# Patient Record
Sex: Male | Born: 1960 | Race: White | Hispanic: No | Smoking: Former smoker
Health system: Southern US, Community
[De-identification: ages and names within clinical notes are randomized; demographics above are authoritative.]

## PROBLEM LIST (undated history)

## (undated) DIAGNOSIS — T451X5A Adverse effect of antineoplastic and immunosuppressive drugs, initial encounter: Secondary | ICD-10-CM

## (undated) DIAGNOSIS — C099 Malignant neoplasm of tonsil, unspecified: Secondary | ICD-10-CM

## (undated) DIAGNOSIS — F32A Depression, unspecified: Secondary | ICD-10-CM

## (undated) DIAGNOSIS — K219 Gastro-esophageal reflux disease without esophagitis: Secondary | ICD-10-CM

## (undated) DIAGNOSIS — F4310 Post-traumatic stress disorder, unspecified: Secondary | ICD-10-CM

## (undated) DIAGNOSIS — I219 Acute myocardial infarction, unspecified: Secondary | ICD-10-CM

## (undated) DIAGNOSIS — I10 Essential (primary) hypertension: Secondary | ICD-10-CM

## (undated) DIAGNOSIS — Z9289 Personal history of other medical treatment: Secondary | ICD-10-CM

## (undated) DIAGNOSIS — G629 Polyneuropathy, unspecified: Secondary | ICD-10-CM

## (undated) DIAGNOSIS — F1910 Other psychoactive substance abuse, uncomplicated: Secondary | ICD-10-CM

## (undated) DIAGNOSIS — T6701XA Heatstroke and sunstroke, initial encounter: Secondary | ICD-10-CM

## (undated) DIAGNOSIS — I2510 Atherosclerotic heart disease of native coronary artery without angina pectoris: Secondary | ICD-10-CM

## (undated) DIAGNOSIS — M47812 Spondylosis without myelopathy or radiculopathy, cervical region: Secondary | ICD-10-CM

## (undated) DIAGNOSIS — E039 Hypothyroidism, unspecified: Secondary | ICD-10-CM

## (undated) DIAGNOSIS — D649 Anemia, unspecified: Secondary | ICD-10-CM

## (undated) DIAGNOSIS — C439 Malignant melanoma of skin, unspecified: Secondary | ICD-10-CM

## (undated) DIAGNOSIS — F329 Major depressive disorder, single episode, unspecified: Secondary | ICD-10-CM

## (undated) DIAGNOSIS — C4491 Basal cell carcinoma of skin, unspecified: Secondary | ICD-10-CM

## (undated) DIAGNOSIS — I639 Cerebral infarction, unspecified: Secondary | ICD-10-CM

## (undated) DIAGNOSIS — IMO0002 Reserved for concepts with insufficient information to code with codable children: Secondary | ICD-10-CM

## (undated) DIAGNOSIS — E785 Hyperlipidemia, unspecified: Secondary | ICD-10-CM

## (undated) DIAGNOSIS — F419 Anxiety disorder, unspecified: Secondary | ICD-10-CM

## (undated) DIAGNOSIS — T8859XA Other complications of anesthesia, initial encounter: Secondary | ICD-10-CM

## (undated) DIAGNOSIS — T4145XA Adverse effect of unspecified anesthetic, initial encounter: Secondary | ICD-10-CM

## (undated) DIAGNOSIS — I4891 Unspecified atrial fibrillation: Secondary | ICD-10-CM

## (undated) DIAGNOSIS — G620 Drug-induced polyneuropathy: Secondary | ICD-10-CM

## (undated) HISTORY — PX: NECK DISSECTION: SUR422

## (undated) HISTORY — DX: Personal history of other medical treatment: Z92.89

## (undated) HISTORY — DX: Hypothyroidism, unspecified: E03.9

## (undated) HISTORY — PX: CAROTID ENDARTERECTOMY: SUR193

## (undated) HISTORY — DX: Malignant neoplasm of tonsil, unspecified: C09.9

## (undated) HISTORY — DX: Anxiety disorder, unspecified: F41.9

## (undated) HISTORY — PX: PARTIAL GLOSSECTOMY: SHX2173

## (undated) HISTORY — DX: Other psychoactive substance abuse, uncomplicated: F19.10

## (undated) HISTORY — DX: Polyneuropathy, unspecified: G62.9

## (undated) HISTORY — DX: Acute myocardial infarction, unspecified: I21.9

## (undated) HISTORY — DX: Spondylosis without myelopathy or radiculopathy, cervical region: M47.812

## (undated) HISTORY — PX: MELANOMA EXCISION: SHX5266

## (undated) HISTORY — PX: SKIN CANCER EXCISION: SHX779

## (undated) HISTORY — DX: Post-traumatic stress disorder, unspecified: F43.10

## (undated) HISTORY — DX: Essential (primary) hypertension: I10

## (undated) HISTORY — DX: Basal cell carcinoma of skin, unspecified: C44.91

## (undated) HISTORY — DX: Malignant melanoma of skin, unspecified: C43.9

---

## 2003-05-18 ENCOUNTER — Emergency Department (HOSPITAL_COMMUNITY): Admission: EM | Admit: 2003-05-18 | Discharge: 2003-05-18 | Payer: Self-pay | Admitting: Emergency Medicine

## 2003-12-01 ENCOUNTER — Ambulatory Visit (HOSPITAL_COMMUNITY): Admission: RE | Admit: 2003-12-01 | Discharge: 2003-12-01 | Payer: Self-pay | Admitting: *Deleted

## 2003-12-14 ENCOUNTER — Emergency Department (HOSPITAL_COMMUNITY): Admission: EM | Admit: 2003-12-14 | Discharge: 2003-12-14 | Payer: Self-pay | Admitting: Emergency Medicine

## 2005-05-02 ENCOUNTER — Emergency Department (HOSPITAL_COMMUNITY): Admission: EM | Admit: 2005-05-02 | Discharge: 2005-05-02 | Payer: Self-pay | Admitting: Emergency Medicine

## 2005-10-26 ENCOUNTER — Emergency Department (HOSPITAL_COMMUNITY): Admission: EM | Admit: 2005-10-26 | Discharge: 2005-10-26 | Payer: Self-pay | Admitting: Emergency Medicine

## 2007-03-13 DIAGNOSIS — Z9289 Personal history of other medical treatment: Secondary | ICD-10-CM

## 2007-03-13 HISTORY — DX: Personal history of other medical treatment: Z92.89

## 2007-03-13 HISTORY — PX: PHARYNGECTOMY: SUR1024

## 2007-03-13 HISTORY — PX: TONSILLECTOMY: SUR1361

## 2007-03-13 HISTORY — PX: MULTIPLE TOOTH EXTRACTIONS: SHX2053

## 2007-09-01 ENCOUNTER — Emergency Department (HOSPITAL_COMMUNITY): Admission: EM | Admit: 2007-09-01 | Discharge: 2007-09-01 | Payer: Self-pay | Admitting: Emergency Medicine

## 2007-09-02 ENCOUNTER — Emergency Department (HOSPITAL_COMMUNITY): Admission: EM | Admit: 2007-09-02 | Discharge: 2007-09-03 | Payer: Self-pay | Admitting: Emergency Medicine

## 2007-09-16 ENCOUNTER — Ambulatory Visit: Admission: RE | Admit: 2007-09-16 | Discharge: 2007-12-17 | Payer: Self-pay | Admitting: Radiation Oncology

## 2007-09-19 ENCOUNTER — Encounter: Admission: RE | Admit: 2007-09-19 | Discharge: 2007-09-19 | Payer: Self-pay | Admitting: Dentistry

## 2007-09-19 ENCOUNTER — Ambulatory Visit: Payer: Self-pay | Admitting: Dentistry

## 2007-09-22 ENCOUNTER — Ambulatory Visit: Payer: Self-pay | Admitting: Hematology & Oncology

## 2007-09-25 ENCOUNTER — Ambulatory Visit: Payer: Self-pay | Admitting: Dentistry

## 2007-10-01 LAB — CBC WITH DIFFERENTIAL/PLATELET
Basophils Absolute: 0.1 10*3/uL (ref 0.0–0.1)
EOS%: 0.8 % (ref 0.0–7.0)
HCT: 37 % — ABNORMAL LOW (ref 38.7–49.9)
HGB: 12.5 g/dL — ABNORMAL LOW (ref 13.0–17.1)
MCH: 30.9 pg (ref 28.0–33.4)
MCV: 91.1 fL (ref 81.6–98.0)
MONO%: 8.1 % (ref 0.0–13.0)
NEUT%: 63.9 % (ref 40.0–75.0)

## 2007-10-01 LAB — COMPREHENSIVE METABOLIC PANEL
ALT: 8 U/L (ref 0–53)
AST: 12 U/L (ref 0–37)
Albumin: 4.2 g/dL (ref 3.5–5.2)
BUN: 12 mg/dL (ref 6–23)
Chloride: 106 mEq/L (ref 96–112)
Creatinine, Ser: 0.89 mg/dL (ref 0.40–1.50)
Glucose, Bld: 95 mg/dL (ref 70–99)
Total Protein: 6.4 g/dL (ref 6.0–8.3)

## 2007-10-01 LAB — PROTHROMBIN TIME: INR: 1 (ref 0.0–1.5)

## 2007-10-14 ENCOUNTER — Ambulatory Visit (HOSPITAL_COMMUNITY): Admission: RE | Admit: 2007-10-14 | Discharge: 2007-10-14 | Payer: Self-pay | Admitting: Radiation Oncology

## 2007-10-20 LAB — CBC WITH DIFFERENTIAL/PLATELET
BASO%: 0.8 % (ref 0.0–2.0)
Basophils Absolute: 0.1 10*3/uL (ref 0.0–0.1)
HCT: 40 % (ref 38.7–49.9)
HGB: 13.3 g/dL (ref 13.0–17.1)
LYMPH%: 16.9 % (ref 14.0–48.0)
MCHC: 33.2 g/dL (ref 32.0–35.9)
MONO#: 0.8 10*3/uL (ref 0.1–0.9)
NEUT%: 73.6 % (ref 40.0–75.0)
Platelets: 210 10*3/uL (ref 145–400)
WBC: 10.5 10*3/uL — ABNORMAL HIGH (ref 4.0–10.0)

## 2007-10-20 LAB — COMPREHENSIVE METABOLIC PANEL
ALT: 9 U/L (ref 0–53)
BUN: 7 mg/dL (ref 6–23)
CO2: 28 mEq/L (ref 19–32)
Calcium: 9.3 mg/dL (ref 8.4–10.5)
Creatinine, Ser: 0.85 mg/dL (ref 0.40–1.50)
Glucose, Bld: 89 mg/dL (ref 70–99)
Total Bilirubin: 0.7 mg/dL (ref 0.3–1.2)

## 2007-10-23 LAB — BASIC METABOLIC PANEL
CO2: 25 mEq/L (ref 19–32)
Chloride: 103 mEq/L (ref 96–112)
Glucose, Bld: 113 mg/dL — ABNORMAL HIGH (ref 70–99)
Potassium: 3.1 mEq/L — ABNORMAL LOW (ref 3.5–5.3)
Sodium: 137 mEq/L (ref 135–145)

## 2007-10-27 LAB — COMPREHENSIVE METABOLIC PANEL
ALT: 91 U/L — ABNORMAL HIGH (ref 0–53)
AST: 44 U/L — ABNORMAL HIGH (ref 0–37)
BUN: 12 mg/dL (ref 6–23)
CO2: 30 mEq/L (ref 19–32)
Calcium: 9.9 mg/dL (ref 8.4–10.5)
Chloride: 100 mEq/L (ref 96–112)
Creatinine, Ser: 0.85 mg/dL (ref 0.40–1.50)
Total Bilirubin: 0.9 mg/dL (ref 0.3–1.2)

## 2007-10-27 LAB — CBC WITH DIFFERENTIAL/PLATELET
BASO%: 0.3 % (ref 0.0–2.0)
Basophils Absolute: 0 10*3/uL (ref 0.0–0.1)
EOS%: 1.7 % (ref 0.0–7.0)
HCT: 44.3 % (ref 38.7–49.9)
HGB: 14.9 g/dL (ref 13.0–17.1)
LYMPH%: 20.7 % (ref 14.0–48.0)
MCH: 29.5 pg (ref 28.0–33.4)
MCHC: 33.5 g/dL (ref 32.0–35.9)
MCV: 88.1 fL (ref 81.6–98.0)
NEUT%: 69.4 % (ref 40.0–75.0)
Platelets: 217 10*3/uL (ref 145–400)

## 2007-10-29 ENCOUNTER — Ambulatory Visit: Payer: Self-pay | Admitting: Dentistry

## 2007-11-03 LAB — CBC WITH DIFFERENTIAL/PLATELET
BASO%: 0.5 % (ref 0.0–2.0)
Basophils Absolute: 0 10*3/uL (ref 0.0–0.1)
EOS%: 1.4 % (ref 0.0–7.0)
HGB: 13.8 g/dL (ref 13.0–17.1)
MCH: 29.8 pg (ref 28.0–33.4)
MCHC: 33.9 g/dL (ref 32.0–35.9)
MCV: 88 fL (ref 81.6–98.0)
MONO%: 6.3 % (ref 0.0–13.0)
RBC: 4.63 10*6/uL (ref 4.20–5.71)
RDW: 15.1 % — ABNORMAL HIGH (ref 11.2–14.6)

## 2007-11-03 LAB — COMPREHENSIVE METABOLIC PANEL
AST: 15 U/L (ref 0–37)
Albumin: 4.2 g/dL (ref 3.5–5.2)
Alkaline Phosphatase: 67 U/L (ref 39–117)
BUN: 10 mg/dL (ref 6–23)
Potassium: 4.6 mEq/L (ref 3.5–5.3)

## 2007-11-06 ENCOUNTER — Ambulatory Visit: Payer: Self-pay | Admitting: Oncology

## 2007-11-10 LAB — COMPREHENSIVE METABOLIC PANEL
AST: 24 U/L (ref 0–37)
Alkaline Phosphatase: 61 U/L (ref 39–117)
BUN: 8 mg/dL (ref 6–23)
Creatinine, Ser: 0.86 mg/dL (ref 0.40–1.50)
Total Bilirubin: 0.7 mg/dL (ref 0.3–1.2)

## 2007-11-10 LAB — CBC WITH DIFFERENTIAL/PLATELET
Basophils Absolute: 0 10*3/uL (ref 0.0–0.1)
EOS%: 3.7 % (ref 0.0–7.0)
HCT: 43.6 % (ref 38.7–49.9)
HGB: 15 g/dL (ref 13.0–17.1)
MCH: 29.3 pg (ref 28.0–33.4)
MCV: 85.3 fL (ref 81.6–98.0)
MONO%: 15.3 % — ABNORMAL HIGH (ref 0.0–13.0)
NEUT%: 61 % (ref 40.0–75.0)
RDW: 14.5 % (ref 11.2–14.6)

## 2007-11-18 ENCOUNTER — Inpatient Hospital Stay (HOSPITAL_COMMUNITY): Admission: AD | Admit: 2007-11-18 | Discharge: 2007-11-24 | Payer: Self-pay | Admitting: Oncology

## 2007-11-18 LAB — CBC WITH DIFFERENTIAL/PLATELET
BASO%: 0.5 % (ref 0.0–2.0)
Eosinophils Absolute: 0 10*3/uL (ref 0.0–0.5)
MCHC: 34.6 g/dL (ref 32.0–35.9)
MONO#: 0.5 10*3/uL (ref 0.1–0.9)
NEUT#: 4.7 10*3/uL (ref 1.5–6.5)
Platelets: 106 10*3/uL — ABNORMAL LOW (ref 145–400)
RBC: 5.32 10*6/uL (ref 4.20–5.71)
RDW: 14.2 % (ref 11.2–14.6)
WBC: 5.8 10*3/uL (ref 4.0–10.0)
lymph#: 0.5 10*3/uL — ABNORMAL LOW (ref 0.9–3.3)

## 2007-11-18 LAB — COMPREHENSIVE METABOLIC PANEL
ALT: 66 U/L — ABNORMAL HIGH (ref 0–53)
Albumin: 4 g/dL (ref 3.5–5.2)
CO2: 31 mEq/L (ref 19–32)
Chloride: 95 mEq/L — ABNORMAL LOW (ref 96–112)
Glucose, Bld: 111 mg/dL — ABNORMAL HIGH (ref 70–99)
Potassium: 4 mEq/L (ref 3.5–5.3)
Sodium: 137 mEq/L (ref 135–145)
Total Protein: 7.1 g/dL (ref 6.0–8.3)

## 2007-11-19 ENCOUNTER — Ambulatory Visit: Payer: Self-pay | Admitting: Hematology

## 2007-12-01 LAB — COMPREHENSIVE METABOLIC PANEL
ALT: 19 U/L (ref 0–53)
Albumin: 3.5 g/dL (ref 3.5–5.2)
Alkaline Phosphatase: 60 U/L (ref 39–117)
Glucose, Bld: 122 mg/dL — ABNORMAL HIGH (ref 70–99)
Potassium: 4.3 mEq/L (ref 3.5–5.3)
Sodium: 139 mEq/L (ref 135–145)
Total Bilirubin: 0.4 mg/dL (ref 0.3–1.2)
Total Protein: 6.2 g/dL (ref 6.0–8.3)

## 2007-12-01 LAB — CBC WITH DIFFERENTIAL/PLATELET
BASO%: 0.7 % (ref 0.0–2.0)
Eosinophils Absolute: 0 10*3/uL (ref 0.0–0.5)
LYMPH%: 20.9 % (ref 14.0–48.0)
MCHC: 34.2 g/dL (ref 32.0–35.9)
MONO#: 0.4 10*3/uL (ref 0.1–0.9)
MONO%: 29 % — ABNORMAL HIGH (ref 0.0–13.0)
NEUT#: 0.6 10*3/uL — ABNORMAL LOW (ref 1.5–6.5)
Platelets: 147 10*3/uL (ref 145–400)
RBC: 4.24 10*6/uL (ref 4.20–5.71)
RDW: 18.2 % — ABNORMAL HIGH (ref 11.2–14.6)
WBC: 1.3 10*3/uL — ABNORMAL LOW (ref 4.0–10.0)

## 2007-12-09 LAB — CBC WITH DIFFERENTIAL/PLATELET
BASO%: 0 % (ref 0.0–2.0)
EOS%: 0.1 % (ref 0.0–7.0)
LYMPH%: 7.5 % — ABNORMAL LOW (ref 14.0–48.0)
MCH: 29.4 pg (ref 28.0–33.4)
MCHC: 33.7 g/dL (ref 32.0–35.9)
MCV: 87.3 fL (ref 81.6–98.0)
MONO%: 11.8 % (ref 0.0–13.0)
NEUT#: 5.3 10*3/uL (ref 1.5–6.5)
Platelets: 268 10*3/uL (ref 145–400)
RBC: 4.74 10*6/uL (ref 4.20–5.71)
RDW: 19.6 % — ABNORMAL HIGH (ref 11.2–14.6)

## 2007-12-18 LAB — CBC WITH DIFFERENTIAL/PLATELET
BASO%: 0.4 % (ref 0.0–2.0)
Basophils Absolute: 0 10e3/uL (ref 0.0–0.1)
EOS%: 0.3 % (ref 0.0–7.0)
Eosinophils Absolute: 0 10e3/uL (ref 0.0–0.5)
HCT: 41.3 % (ref 38.7–49.9)
HGB: 14 g/dL (ref 13.0–17.1)
LYMPH%: 5.4 % — ABNORMAL LOW (ref 14.0–48.0)
MCH: 29.3 pg (ref 28.0–33.4)
MCHC: 34 g/dL (ref 32.0–35.9)
MCV: 86.1 fL (ref 81.6–98.0)
MONO#: 1 10e3/uL — ABNORMAL HIGH (ref 0.1–0.9)
MONO%: 13.9 % — ABNORMAL HIGH (ref 0.0–13.0)
NEUT#: 5.7 10e3/uL (ref 1.5–6.5)
NEUT%: 80 % — ABNORMAL HIGH (ref 40.0–75.0)
Platelets: 156 10e3/uL (ref 145–400)
RBC: 4.8 10e6/uL (ref 4.20–5.71)
RDW: 20.3 % — ABNORMAL HIGH (ref 11.2–14.6)
WBC: 7.1 10e3/uL (ref 4.0–10.0)
lymph#: 0.4 10e3/uL — ABNORMAL LOW (ref 0.9–3.3)

## 2007-12-18 LAB — COMPREHENSIVE METABOLIC PANEL
Albumin: 3.9 g/dL (ref 3.5–5.2)
CO2: 30 mEq/L (ref 19–32)
Calcium: 9.9 mg/dL (ref 8.4–10.5)
Chloride: 98 mEq/L (ref 96–112)
Glucose, Bld: 133 mg/dL — ABNORMAL HIGH (ref 70–99)
Sodium: 138 mEq/L (ref 135–145)
Total Bilirubin: 0.3 mg/dL (ref 0.3–1.2)
Total Protein: 6.1 g/dL (ref 6.0–8.3)

## 2008-01-13 ENCOUNTER — Ambulatory Visit: Payer: Self-pay | Admitting: Dentistry

## 2008-01-27 ENCOUNTER — Ambulatory Visit (HOSPITAL_COMMUNITY): Admission: RE | Admit: 2008-01-27 | Discharge: 2008-01-27 | Payer: Self-pay | Admitting: Radiation Oncology

## 2008-02-09 ENCOUNTER — Ambulatory Visit: Payer: Self-pay | Admitting: Oncology

## 2008-02-20 LAB — CBC WITH DIFFERENTIAL/PLATELET
Basophils Absolute: 0 10*3/uL (ref 0.0–0.1)
EOS%: 1.5 % (ref 0.0–7.0)
Eosinophils Absolute: 0.1 10*3/uL (ref 0.0–0.5)
HGB: 14.5 g/dL (ref 13.0–17.1)
LYMPH%: 15.9 % (ref 14.0–48.0)
MCH: 34.5 pg — ABNORMAL HIGH (ref 28.0–33.4)
MCV: 98.9 fL — ABNORMAL HIGH (ref 81.6–98.0)
MONO%: 10.5 % (ref 0.0–13.0)
NEUT#: 2.7 10*3/uL (ref 1.5–6.5)
Platelets: 122 10*3/uL — ABNORMAL LOW (ref 145–400)
RBC: 4.2 10*6/uL (ref 4.20–5.71)
RDW: 14.1 % (ref 11.2–14.6)

## 2008-02-20 LAB — COMPREHENSIVE METABOLIC PANEL
AST: 11 U/L (ref 0–37)
Alkaline Phosphatase: 56 U/L (ref 39–117)
BUN: 9 mg/dL (ref 6–23)
Glucose, Bld: 89 mg/dL (ref 70–99)
Potassium: 4.1 mEq/L (ref 3.5–5.3)
Total Bilirubin: 0.5 mg/dL (ref 0.3–1.2)

## 2008-03-12 HISTORY — PX: CERVICAL DISCECTOMY: SHX98

## 2008-03-16 ENCOUNTER — Ambulatory Visit (HOSPITAL_COMMUNITY): Admission: RE | Admit: 2008-03-16 | Discharge: 2008-03-16 | Payer: Self-pay | Admitting: Oncology

## 2008-04-05 ENCOUNTER — Ambulatory Visit: Payer: Self-pay | Admitting: Oncology

## 2008-04-29 ENCOUNTER — Ambulatory Visit (HOSPITAL_COMMUNITY): Admission: RE | Admit: 2008-04-29 | Discharge: 2008-04-29 | Payer: Self-pay | Admitting: Oncology

## 2008-05-03 ENCOUNTER — Ambulatory Visit: Payer: Self-pay | Admitting: Dentistry

## 2008-06-15 ENCOUNTER — Ambulatory Visit: Admission: RE | Admit: 2008-06-15 | Discharge: 2008-06-21 | Payer: Self-pay | Admitting: Radiation Oncology

## 2008-08-26 ENCOUNTER — Emergency Department (HOSPITAL_COMMUNITY): Admission: EM | Admit: 2008-08-26 | Discharge: 2008-08-26 | Payer: Self-pay | Admitting: Emergency Medicine

## 2008-10-01 ENCOUNTER — Ambulatory Visit: Payer: Self-pay | Admitting: Oncology

## 2008-10-06 ENCOUNTER — Encounter: Payer: Self-pay | Admitting: Internal Medicine

## 2008-10-06 LAB — CBC WITH DIFFERENTIAL/PLATELET
BASO%: 0.3 % (ref 0.0–2.0)
EOS%: 0.7 % (ref 0.0–7.0)
LYMPH%: 15 % (ref 14.0–49.0)
MCH: 32.7 pg (ref 27.2–33.4)
MCHC: 34.2 g/dL (ref 32.0–36.0)
MCV: 95.7 fL (ref 79.3–98.0)
MONO%: 9.8 % (ref 0.0–14.0)
NEUT#: 4.1 10*3/uL (ref 1.5–6.5)
Platelets: 154 10*3/uL (ref 140–400)
RBC: 4.71 10*6/uL (ref 4.20–5.82)
RDW: 13.5 % (ref 11.0–14.6)

## 2008-10-06 LAB — COMPREHENSIVE METABOLIC PANEL
AST: 43 U/L — ABNORMAL HIGH (ref 0–37)
Albumin: 4.6 g/dL (ref 3.5–5.2)
Alkaline Phosphatase: 70 U/L (ref 39–117)
Potassium: 4 mEq/L (ref 3.5–5.3)
Sodium: 139 mEq/L (ref 135–145)
Total Bilirubin: 0.6 mg/dL (ref 0.3–1.2)
Total Protein: 6.6 g/dL (ref 6.0–8.3)

## 2009-02-05 ENCOUNTER — Ambulatory Visit (HOSPITAL_COMMUNITY): Admission: RE | Admit: 2009-02-05 | Discharge: 2009-02-05 | Payer: Self-pay | Admitting: Orthopaedic Surgery

## 2009-02-07 ENCOUNTER — Ambulatory Visit: Payer: Self-pay | Admitting: Oncology

## 2009-02-19 ENCOUNTER — Ambulatory Visit (HOSPITAL_COMMUNITY): Admission: RE | Admit: 2009-02-19 | Discharge: 2009-02-19 | Payer: Self-pay | Admitting: Orthopaedic Surgery

## 2009-03-08 ENCOUNTER — Ambulatory Visit (HOSPITAL_COMMUNITY): Admission: RE | Admit: 2009-03-08 | Discharge: 2009-03-09 | Payer: Self-pay | Admitting: Orthopaedic Surgery

## 2009-03-14 ENCOUNTER — Ambulatory Visit: Payer: Self-pay | Admitting: Oncology

## 2009-03-17 ENCOUNTER — Encounter: Payer: Self-pay | Admitting: Internal Medicine

## 2009-03-17 LAB — COMPREHENSIVE METABOLIC PANEL
Alkaline Phosphatase: 75 U/L (ref 39–117)
BUN: 13 mg/dL (ref 6–23)
Creatinine, Ser: 0.88 mg/dL (ref 0.40–1.50)
Glucose, Bld: 73 mg/dL (ref 70–99)
Sodium: 140 mEq/L (ref 135–145)
Total Bilirubin: 0.4 mg/dL (ref 0.3–1.2)

## 2009-03-17 LAB — CBC WITH DIFFERENTIAL/PLATELET
Basophils Absolute: 0 10*3/uL (ref 0.0–0.1)
Eosinophils Absolute: 0.1 10*3/uL (ref 0.0–0.5)
HCT: 45.3 % (ref 38.4–49.9)
LYMPH%: 13.3 % — ABNORMAL LOW (ref 14.0–49.0)
MCV: 98.1 fL — ABNORMAL HIGH (ref 79.3–98.0)
MONO%: 8.6 % (ref 0.0–14.0)
NEUT#: 6.3 10*3/uL (ref 1.5–6.5)
NEUT%: 76.6 % — ABNORMAL HIGH (ref 39.0–75.0)
Platelets: 181 10*3/uL (ref 140–400)
RBC: 4.62 10*6/uL (ref 4.20–5.82)

## 2009-03-17 LAB — TSH: TSH: 2.309 u[IU]/mL (ref 0.350–4.500)

## 2009-05-10 ENCOUNTER — Ambulatory Visit (HOSPITAL_COMMUNITY): Admission: RE | Admit: 2009-05-10 | Discharge: 2009-05-10 | Payer: Self-pay | Admitting: Oncology

## 2009-07-20 ENCOUNTER — Ambulatory Visit: Payer: Self-pay | Admitting: Oncology

## 2009-09-09 ENCOUNTER — Ambulatory Visit: Payer: Self-pay | Admitting: Oncology

## 2009-09-14 LAB — COMPREHENSIVE METABOLIC PANEL
ALT: 8 U/L (ref 0–53)
AST: 13 U/L (ref 0–37)
Albumin: 4.8 g/dL (ref 3.5–5.2)
Alkaline Phosphatase: 61 U/L (ref 39–117)
Potassium: 4.5 mEq/L (ref 3.5–5.3)
Sodium: 138 mEq/L (ref 135–145)
Total Bilirubin: 0.5 mg/dL (ref 0.3–1.2)
Total Protein: 6.7 g/dL (ref 6.0–8.3)

## 2009-09-14 LAB — CBC WITH DIFFERENTIAL/PLATELET
BASO%: 0.8 % (ref 0.0–2.0)
EOS%: 1.2 % (ref 0.0–7.0)
Eosinophils Absolute: 0.1 10*3/uL (ref 0.0–0.5)
LYMPH%: 17.2 % (ref 14.0–49.0)
MCHC: 35 g/dL (ref 32.0–36.0)
MCV: 96.9 fL (ref 79.3–98.0)
MONO%: 10.4 % (ref 0.0–14.0)
NEUT#: 3.5 10*3/uL (ref 1.5–6.5)
Platelets: 137 10*3/uL — ABNORMAL LOW (ref 140–400)
RBC: 4.72 10*6/uL (ref 4.20–5.82)
RDW: 13.1 % (ref 11.0–14.6)

## 2009-12-13 ENCOUNTER — Ambulatory Visit: Payer: Self-pay | Admitting: Oncology

## 2009-12-28 ENCOUNTER — Ambulatory Visit: Admission: RE | Admit: 2009-12-28 | Discharge: 2009-12-28 | Payer: Self-pay | Admitting: Radiation Oncology

## 2009-12-28 ENCOUNTER — Ambulatory Visit (HOSPITAL_COMMUNITY): Admission: RE | Admit: 2009-12-28 | Discharge: 2009-12-28 | Payer: Self-pay | Admitting: Radiation Oncology

## 2009-12-30 LAB — T4, FREE: Free T4: 0.91 ng/dL (ref 0.80–1.80)

## 2010-03-20 ENCOUNTER — Ambulatory Visit: Payer: Self-pay | Admitting: Oncology

## 2010-04-02 ENCOUNTER — Encounter: Payer: Self-pay | Admitting: Oncology

## 2010-04-03 ENCOUNTER — Encounter: Payer: Self-pay | Admitting: Oncology

## 2010-04-13 NOTE — Letter (Signed)
[facility]   Imported By: Lester Dale 04/06/2009 16:38:49  _____________________________________________________________________  External Attachment:    Type:   Image     Comment:   External Document

## 2010-04-21 ENCOUNTER — Encounter (HOSPITAL_BASED_OUTPATIENT_CLINIC_OR_DEPARTMENT_OTHER): Payer: Medicare Other | Admitting: Oncology

## 2010-04-21 ENCOUNTER — Other Ambulatory Visit: Payer: Self-pay | Admitting: Oncology

## 2010-04-21 DIAGNOSIS — K117 Disturbances of salivary secretion: Secondary | ICD-10-CM

## 2010-04-21 DIAGNOSIS — R5381 Other malaise: Secondary | ICD-10-CM

## 2010-04-21 DIAGNOSIS — R4182 Altered mental status, unspecified: Secondary | ICD-10-CM

## 2010-04-21 DIAGNOSIS — C090 Malignant neoplasm of tonsillar fossa: Secondary | ICD-10-CM

## 2010-04-21 LAB — CBC WITH DIFFERENTIAL/PLATELET
BASO%: 0.3 % (ref 0.0–2.0)
Basophils Absolute: 0 10*3/uL (ref 0.0–0.1)
HCT: 45.5 % (ref 38.4–49.9)
HGB: 15.5 g/dL (ref 13.0–17.1)
MONO#: 0.5 10*3/uL (ref 0.1–0.9)
NEUT#: 3.8 10*3/uL (ref 1.5–6.5)
NEUT%: 65.3 % (ref 39.0–75.0)
RDW: 13.1 % (ref 11.0–14.6)
WBC: 5.9 10*3/uL (ref 4.0–10.3)
lymph#: 1.4 10*3/uL (ref 0.9–3.3)

## 2010-04-21 LAB — COMPREHENSIVE METABOLIC PANEL
ALT: 9 U/L (ref 0–53)
AST: 13 U/L (ref 0–37)
Albumin: 4.6 g/dL (ref 3.5–5.2)
BUN: 13 mg/dL (ref 6–23)
CO2: 28 mEq/L (ref 19–32)
Calcium: 9.6 mg/dL (ref 8.4–10.5)
Chloride: 106 mEq/L (ref 96–112)
Creatinine, Ser: 0.99 mg/dL (ref 0.40–1.50)
Potassium: 4.4 mEq/L (ref 3.5–5.3)

## 2010-04-21 LAB — MAGNESIUM: Magnesium: 2.2 mg/dL (ref 1.5–2.5)

## 2010-06-12 LAB — URINALYSIS, ROUTINE W REFLEX MICROSCOPIC
Hgb urine dipstick: NEGATIVE
Nitrite: NEGATIVE
Specific Gravity, Urine: 1.012 (ref 1.005–1.030)
Urobilinogen, UA: 0.2 mg/dL (ref 0.0–1.0)
pH: 7 (ref 5.0–8.0)

## 2010-06-12 LAB — CBC
HCT: 45.6 % (ref 39.0–52.0)
MCHC: 34.8 g/dL (ref 30.0–36.0)
MCV: 97.7 fL (ref 78.0–100.0)
Platelets: 143 10*3/uL — ABNORMAL LOW (ref 150–400)
RDW: 12.9 % (ref 11.5–15.5)
WBC: 5.4 10*3/uL (ref 4.0–10.5)

## 2010-06-12 LAB — COMPREHENSIVE METABOLIC PANEL
AST: 23 U/L (ref 0–37)
Albumin: 4.1 g/dL (ref 3.5–5.2)
BUN: 11 mg/dL (ref 6–23)
Calcium: 9.3 mg/dL (ref 8.4–10.5)
Chloride: 107 mEq/L (ref 96–112)
Creatinine, Ser: 0.94 mg/dL (ref 0.4–1.5)
GFR calc Af Amer: >60 mL/min (ref >60)
Total Bilirubin: 0.6 mg/dL (ref 0.3–1.2)

## 2010-06-19 LAB — URINALYSIS, ROUTINE W REFLEX MICROSCOPIC
Ketones, ur: NEGATIVE mg/dL
Nitrite: NEGATIVE
Protein, ur: NEGATIVE mg/dL

## 2010-06-19 LAB — BASIC METABOLIC PANEL
BUN: 6 mg/dL (ref 6–23)
Calcium: 9.2 mg/dL (ref 8.4–10.5)
Creatinine, Ser: 0.83 mg/dL (ref 0.4–1.5)
GFR calc Af Amer: >60 mL/min (ref >60)
GFR calc non Af Amer: >60 mL/min (ref >60)

## 2010-06-19 LAB — DIFFERENTIAL
Eosinophils Absolute: 0.1 10*3/uL (ref 0.0–0.7)
Lymphocytes Relative: 13 % (ref 12–46)
Lymphs Abs: 0.7 10*3/uL (ref 0.7–4.0)
Neutro Abs: 4.2 10*3/uL (ref 1.7–7.7)
Neutrophils Relative %: 75 % (ref 43–77)

## 2010-06-19 LAB — GLUCOSE, CAPILLARY: Glucose-Capillary: 81 mg/dL (ref 70–99)

## 2010-06-19 LAB — CBC
Platelets: 134 10*3/uL — ABNORMAL LOW (ref 150–400)
WBC: 5.5 10*3/uL (ref 4.0–10.5)

## 2010-06-26 LAB — GLUCOSE, CAPILLARY: Glucose-Capillary: 90 mg/dL (ref 70–99)

## 2010-07-05 ENCOUNTER — Ambulatory Visit: Payer: Medicare Other | Admitting: Radiation Oncology

## 2010-07-25 NOTE — Consult Note (Signed)
NAME:  [name], [name] NO.:  [id number]   MEDICAL RECORD NO.:  [id number]          PATIENT TYPE:  REC   LOCATION:  [facility]                         FACILITY:  [facility]   PHYSICIAN:  Charlynne Pander, D.D.S.DATE OF BIRTH:  [birth year]   DATE OF CONSULTATION:  09/19/2007  DATE OF DISCHARGE:                                 CONSULTATION   Bobby Floyd is a 50 year old male referred by Dr. Chipper Herb  for dental consultation.  The patient with recent diagnosis of squamous  cell carcinoma involving the left tonsil.  The patient with anticipated  chemoradiation therapy.  The patient is now seen as part of pre-  chemoradiation therapy dental protocol evaluation.   MEDICAL HISTORY:  1. 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      a.     Status post surgical resection with Dr. Pollie Friar at       [facility] on August 22, 2007,       which included surgical resection and removal of left canine #22       with mandibulotomy approach to the tumor followed by left       mandibular plating.  The patient's resection included left       tonsillectomy, partial pharyngectomy, partial glossectomy, and       excision of the left retropharyngeal lymph nodes as well as neck       dissection of zones 1, 2, and 3.  The patient also had a       tracheostomy placed.  The patient with 2/21 nodes that were       positive for metastatic disease.      b.     This is surgery was followed by reconstructive surgery       involving the oropharyngeal defect with radical forearm cutaneous       free flap with full-thickness skin grafting for resurfacing of the       forearm.      c.     Anticipated radiation therapy by Dr. Dayton Scrape.      d.     Anticipated chemotherapy with medical oncologist to be named       later.  2. Coronary artery disease-History of myocardial infarction with      angioplasty procedure in July 2004 at [facility].  3. History of hypertension.  4. Anxiety/Depressive Disorder   ALLERGIES/ADVERSE DRUG REACTION:  1. PHENERGAN causes nausea and vomiting.  2. BENADRYL causes jittery filling.   MEDICATIONS:  1. Ativan 1 mg 4 times daily.  2. Desyrel 100 mg as needed for sleep.  3. Atenolol 25 mg at bedtime.  4. Citalopram 1 tablet at bedtime.   SOCIAL HISTORY:  The patient was previously married with a history of 1  son.  Wife recently left him.  Son died approximately 2 years ago from a  car wreck.  The patient had been working with [employer] for  approximately 2 years.  The patient with a history of smoking 1-1/2 pack  per day for 20 years.  The patient quit in June 2009.  The patient  denies use of alcohol.   FAMILY HISTORY:  Mother is alive with a history of goiter and  hypothyroidism as well as a history of hypertension.  Father's health is  unknown.   FUNCTIONAL ASSESSMENT:  The patient remains independent for ADLs at this  time.   REVIEW OF SYSTEMS:  Reviewed with the patient, is included in dental  consultation record.   DENTAL HISTORY/CHIEF COMPLAINT:  The patient needs a preradiation  therapy dental evaluation.   HISTORY OF PRESENT ILLNESS:  The patient with recent diagnosis of  squamous cell carcinoma involving the left tonsil.  The patient with  history of the chemoradiation therapy.  The patient now seen as part of  prechemoradiation therapy dental protocol evaluation.   The patient currently denies acute toothache, swellings, or abscesses.  The patient did have the mandibulotomy procedure which involved  sectioning of the mandible and removal of tooth #22.  This area was then  plated appropriately involving the lower mandible.  The patient sees his  dentist regularly in [place], [place] on every 13-month basis.  The patient was last seen in January 2009.  The patient denies having  any unmet dental needs at this time.   DENTAL EXAM:  GENERAL:  The  patient well-developed, well-nourished male  in no acute distress.  VITAL SIGNS:  Blood pressure is 146/103, pulse rate 81, temperature  97.8.  EXTRAORAL EXAM: The left neck is consistent with previous scar from the  surgical procedure from the left neck and up to the anterior mandible  symphysis area.  There is no right neck lymphadenopathy noted at this  time.  INTRAORAL EXAM:  The patient with normal saliva.  The defect associated  with the previous mandible is noted as well as the soft tissue defect  and reconstructive surgery associated with the surgery in June 2009.  There is no obvious abscess formation noted in the mouth at this time.  DENTITION:  There are multiple missing teeth, numbers 5, 12, 17, 21, 22,  and 28.  The premolars were removed in preparation for orthodontic  therapyby patient report.  PERIODONTAL:  The patient with chronic periodontitis with incipient bone  loss and minimal plaque accumulations.  There is no significant tooth  mobility noted at this time.  DENTAL CARIES:  There are several dental caries and suboptimal dental  restorations noted as per dental charting form.  CROWN OR BRIDGE:  The patient has several crown restorations noted which  appear to be clinically acceptable.  ENDODONTIC:  The patient currently denies acute pulpitis symptoms.  I do  not see any evidence of pathology.  The patient has had previous root  canal therapy associated with tooth numbers 9 and 29.  PROSTHODONTIC:  The patient has no dentures, but maybe in need of  evaluation for prosthodontic rehabilitation in the future.  OCCLUSION:  The patient with a poor occlusal scheme, but has stable  occlusion at this time.  The patient does have a history of previous  orthodontic therapy.   RADIOGRAPHIC INTERPRETATION:  The panoramic x-ray was taken and  supplemented with a full series of dental radiographs.   There are multiple missing teeth.  There is supereruption and drifting  of  the unopposed teeth into the dentulous areas.  There is a space  between tooth numbers 20 and 23 consistent with removal of tooth #22 to  allow for the sectioning of the mandible and subsequent plating.  There  is evidence of the plate along the lower mandible on the left side.  There are multiple dental restorations noted.  There are previous root  canal therapies associated with tooth numbers 9 and 29 with no obvious  persistent periapical pathology.   ASSESSMENT:  1. Plaque accumulations - minimal.  2. Chronic periodontitis with incipient bone loss.  3. Selective areas, gingival recession.  4. No significant tooth mobility.  5. Multiple missing teeth.  6. Dental caries and suboptimal dental restorations as per dental      charting form.  7. Poor occlusal scheme but a stable occlusion.  8. Supereruption of tooth #16 into the edentulous areas.  9. Loss of tooth #22 to allow for the tumor removal during the      surgical resection.   PLAN/RECOMMENDATIONS:  1. I have discussed the risks, benefits, and complications of various      treatment options with the patient in relationship to his medical      and dental conditions, anticipated chemoradiation therapy and      chemoradiation therapy side effects to include xerostomia,      radiation caries, trismus, mucositis, taste changes, gum and jaw      bone changes, risk for infection, bleeding, and osteoradionecrosis.      We discussed various treatment options to include no treatment,      selective extraction of tooth #16 with possible addition of 18, 19,      and 20.  Alveoloplasty as indicated to achieve primary closure,      periodontal therapy, dental restorations, root canal therapy, crown      or bridge therapy, implant therapy, and replacing the missing teeth      after adequate healing approximately 3 months after last radiation      therapy is complete.  The patient currently wishes to proceed with      referral to an oral  surgeon (Dr. Cherly Milich) for second opinion      concerning extraction of tooth #16 and possibly #18, 19, and 20 if      deemed necessary.  Unfortunately, tooth #16, 18, 19, and 20 are in      the primary field of radiation therapy.  Tooth #16 is unopposed and      will continue to supererupt and therefore should be extracted.      Tooth numbers 18, 19, and 20 appear to be clinically intact, but I      am unsure of the overall prognosis for these teeth due to the      previous mandibulotomy procedure.  These teeth will also receive an      extensive dose of radiation therapy.  This is the reason for the      second opinion by the oral surgeon.  The patient agrees to      impressions for fabrication of upper and lower fluoride trays      scatter protection devices as indicated along with plan for initial      periodontal therapy and dental restorations.  The patient      tentatively has been scheduled for a second opinion consultation      with Dr. Cherly Swearengin on Tuesday, September 23, 2007, at 3:45 p.m.  2. Discussion of findings with Dr. Retta Mac and Dr. Dayton Scrape is indicated      to coordinate future simulation appointment and start of radiation      therapy.      Charlynne Pander, D.D.S.  Electronically Signed     RFK/MEDQ  D:  09/19/2007  T:  09/20/2007  Job:  [id number]   cc:   Maryln Gottron, M.D.  Griffith Citron Mohorn, D.D.S.  Dental Medicine

## 2010-07-25 NOTE — Discharge Summary (Signed)
NAME:  Bobby Floyd, Bobby Floyd            ACCOUNT NO.:  [id number]   MEDICAL RECORD NO.:  [id number]          PATIENT TYPE:  INP   LOCATION:  1320                         [facility]ACILITY[facility]  [facility]   PHYSICIAN:  Jethro Bolus, MD            DATE OF BIRTH:  18-Mar-1960   DATE OF ADMISSION:  11/18/2007  DATE OF DISCHARGE:  11/24/2007                               DISCHARGE SUMMARY   DIAGNOSES:  Intractable nausea and vomiting.   CORMOBIDITIES:   1. Tonsillar squamous cell carcinoma.  2. Malnourished.  3. Weight loss.  4. Oral thrush.  5. Hypertension.   SERVICES CONSULTED:  1. Nutrition.  2. Interventional radiology.   PROCEDURE:  1. Placement of Port-A-Cath on November 21, 2007.  2. Abdominal KUB on November 18, 2007, which showed nonspecific,      nonobstructive bowel gas pattern without active cardiopulmonary      disease.   HISTORY OF PRESENT ILLNESS:  In brief, Bobby Floyd is a 50 year old  male with a history of smoking, who was diagnosed with pT2, pN2, MX  squamous cell carcinoma of the left tonsil s/p resection;  in addition  to starting concurrent chemoradiation therapy with cisplatin 100 mg/m2  once every three weeks in addition to daily radiation.  He received his  second dose of cisplatin on November 11, 2007, and was doing well for  the first few days; however, approximately one week later, he developed  intractable nausea and vomiting in addition to weight loss and inability  to tolerate tube feeds; thus was admitted to the hospital for  management.   HOSPITAL COURSE:  He was again attempted on Compazine 10 mg IV q.4h. in  addition to oral Tigan and Marinol.  He had  intractable nausea and  vomiting despite tube feeds, Osmolyte 1.2, going at 10 cc/hr.  Therefore, the decision was made on February 20, 2008, to proceed with  placement of Port-A-Cath for access of parenteral nutrition, as the  patient has had weight loss and is behind in his nutritional  requirement.  During the  last three days of the hospital admission, he  was able to advance on his tube feeds, starting at 10 cc/hr, to 60 cc/hr  without nausea and vomiting.  He also was initiated on TPN on November 21, 2007.  His pain regimen was switched from exclusively MS Contin oral  to fentanyl patch.  He had adequate pain control with a fentanyl patch  with IV Morphine for breakthrough.  He was also able to tolerate oral  intake,with fentanyl patch.  His mouth sore mucositis component of both  radiation-induced, chemo-induced mucositis in addition to oral thrush.  He was prescribed a 14-day course of Diflucan, and we have 10 more days  upon discharge, 100 mg p.o. daily.   DISCHARGE MEDICATIONS:  1. Fluconazole 400 mg p.o. daily x10 days.  2. Tigan 300 mg p.o. q.6h. p.r.n. nausea and vomiting.  3. Marinol 5 mg p.o. q.6h. p.r.n. nausea and vomiting.  4. Ativan 0.5 mg p.o. q.6h. p.r.n. nausea and vomiting.  5. Fentanyl/Duragesic patch 25 mcg per hour,  change every 72 hours.  6. Atenolol 75 mg p.o. nightly.  7. Compazine 10 mg p.o. nightly p.r.n. nausea and vomiting.  8. EMLA cream to port p.r.n. prior to access.  9. Ambien 5 mg p.o. nightly p.r.n. insomnia.  10.Nexium 40 mg p.o. daily.  11.Magic mouthwash without Benadryl, containing Maalox and nystatin,      swish and spit every 6 hours p.r.n.  12.Colace.  13.Senna.  14.Milk of magnesia p.r.n. constipation.   DISCHARGE CONDITION:  Stable.   DIET:  Clear-liquid diet by mouth p.r.n. for pleasure to prevent  esophageal strictures; however, his main nutritions for the next few  weeks are tube feeds via PEG with Osmolyte 1.2 at goal of 90 cc/hr in  addition to TPN per recommendation of nutrition.   CONSULTS:  Follow up with Dr. Gaylyn Rong on December 01, 2007.  Patient is to  have daily radiation with Dr. Chipper Herb and follow up with  nutritionist upon discharge in one week.   ACTIVITY:  Ad lib, out of bed q.2h., at least three times a day.   CODE  STATUS:  Full code.      Jethro Bolus, MD  Electronically Signed     HH/MEDQ  D:  11/24/2007  T:  11/24/2007  Job:  [id number]

## 2010-07-25 NOTE — H&P (Signed)
NAME:  Bobby Floyd, Bobby Floyd            ACCOUNT NO.:  [id number]   MEDICAL RECORD NO.:  [id number]          PATIENT TYPE:  INP   LOCATION:  1604                         FACILITY:  [facility]   PHYSICIAN:  Bobby Floyd        DATE OF BIRTH:  November 29, 1960   DATE OF ADMISSION:  11/18/2007  DATE OF DISCHARGE:                              HISTORY & PHYSICAL   REASON FOR ADMISSION/CHIEF COMPLAINT:  Nausea and vomiting and diarrhea.   HISTORY OF PRESENT ILLNESS:  This is a 50 year old gentleman with a  heavy tobacco abuse, native of [place], had presented in April of 2009  with a left-sided neck mass as well as otalgia.  The patient was seen at  that time by Dr. Darrelyn Floyd in [place], and subsequently was referred  to Dr. Hezzie Floyd  at [facility].  He underwent  initially with fine-needle aspiration of his left-sided neck mass.  It  showed atypical squamous cells very suspicious for malignancy.  He  subsequently underwent a PET CT scan which shows hypermetabolic activity  in the left tonsil as well as a retropharyngeal lymph node.  There was  also a necrotic node at the level 2.  No evidence of any metastatic  disease.  The patient underwent a direct laryngoscopy on June 2009 which  showed an exophytic lesion in the left tonsil as well as the pharyngeal  wall.  He subsequently had a resection of the tumor as well as a left  selective neck dissection with the tumor showing a 2.5 cm left tonsillar  mass with 2/21 lymph nodes were involved.  The patient was seen by  evaluation both Dr. Gaylyn Floyd as well as Dr. Dayton Floyd and felt that given his  positive margins of resection he will benefit from adjuvant radiation  concomitantly with cisplatin.  Cisplatin is planned to be given at 100  mg/sq meter on day #1, #22, and #43.  The patient commenced radiation on  October 21, 2007, and received cisplatin at 100 mg/sq meter on August 10  for a total of 210 mg.  That cycle was complicated by  intractable nausea  and vomiting and required IV fluids on August 11, August 13 as well as  August 14 and August 18.  On August 31 the patient received the 2nd  cycle of cisplatin and again total dosage 210 mg and the patient  received IV fluids on September 2, September 4, and today was brought in  also for IV fluids.  Patient was complaining of intractable nausea and  vomiting and despite having a PEG feeding tube in he was not able to  tolerate food and most of it was coming out in the form of vomiting.  He  also had diarrhea, felt dehydrated, weakness, fatigue, failure to  thrive, and for that reason the patient will be admitted for symptomatic  control as well as IV hydration and obtaining of IV access.   REVIEW OF SYSTEMS:  Did not report any headaches, blurred vision, double  vision.  Did not report any motor or sensory neuropathy, alteration of  mental status,  psychiatric issues, depression.  He did not report any  fever, chills, night sweats.  Did report significant weight loss and  appetite changes.  Did report pain in the mouth as well as mucositis.  Did not report any chest pain, orthopnea, PND, __________ cough,  hemoptysis, hematemesis.  Does report nausea and vomiting and diarrhea.  Does not report any hematochezia or melena.  Does not report any  genitourinary complaints, no frequency, urgency or hesitancy.  No  musculoskeletal complaints.  No arthralgias, myalgias.  Rest of review  of systems is unremarkable.   PAST MEDICAL HISTORY:  1. History of hypertension.  2. Coronary disease.  3. Reflux disease.  4. Depression.   PAST SURGICAL HISTORY:  Lithotripsy.   ALLERGIES:  Allergic to PROMETHAZINE as well as BENADRYL.  There is also  intolerance to [facility].   SOCIAL HISTORY:  Separated, 1 daughter, works full-time.  He has  significant smoking history, about 30 pack-years.  He has also denied  any alcohol or IV drug abuse.   FAMILY HISTORY:  Unremarkable.    PHYSICAL EXAMINATION:  Alert, awake, chronically ill-appearing gentleman  appeared in mild distress.  Blood pressure is 131/91, pulse 87, respirations 18, afebrile at 97.1.  HEAD:  Normocephalic, atraumatic.  Pupils equal and round, reactive to  light.  Oral mucosa showed erythema and induration, no evidence of any  thrush or herpetic lesions.  NECK:  Supple.  No lymphadenopathy.  HEART:  Regular rate and rhythm, S1 and S2.  LUNGS:  Clear to auscultation, no rhonchi or wheeze, or dullness to  percussion.  ABDOMEN:  Soft, nontender, no hepatosplenomegaly.  EXTREMITIES:  Had no edema.   LABORATORY DATA:  Showed a hemoglobin of 15, white cells 5.8, platelet  count 106.  Electrolytes showed sodium 137, creatinine 0.99, bilirubin  1.6.  LFTs, otherwise, showed AST 40, ALT 66.  Albumin 4.0.  Calcium  9.6.   IMPRESSION:  A 50 year old gentleman with the following issues:   1. Advanced squamous cell carcinoma of the left tonsil status post      surgical resection and lymph node dissection.  2. Receiving adjuvant therapy with radiation and cisplatin given at      100 mg per square meter day #1 and #22.  He is status post 2      treatments.  3. Nausea and vomiting, dehydration, and oral mucositis due to #1 and      #2.  4. Diarrhea.  5. Failure to thrive.  6. Weight loss.  7. Intolerance to tube feeds.   PLAN:  Admission of [name] to the oncology unit once a bed is  available, IV hydration around the clock, antiemetics with Compazine and  Decadron due to intolerance to [facility], and also obtaining IV access  through a Port-A-Cath by interventional radiology and nutritional  consults to assist with his poor nutrition.  I also will use morphine  for pain medication as needed.  He is currently a full code.           ______________________________  Bobby Nicely. [facility]  Electronically Signed     FNS/MEDQ  D:  11/18/2007  T:  11/19/2007  Job:  [id number]   cc:   Bobby Floyd, M.D.   Fax: [phone]   Bobby Floyd, M.D.  [facility]

## 2010-10-10 ENCOUNTER — Ambulatory Visit
Admission: RE | Admit: 2010-10-10 | Discharge: 2010-10-10 | Disposition: A | Discharge status: Home / Self Care | Payer: Medicare Other | Source: Ambulatory Visit | Attending: Radiation Oncology | Admitting: Radiation Oncology

## 2010-11-11 HISTORY — PX: POSTERIOR FUSION CERVICAL SPINE: SUR628

## 2010-11-27 ENCOUNTER — Encounter (HOSPITAL_COMMUNITY)
Admission: RE | Admit: 2010-11-27 | Discharge: 2010-11-27 | Disposition: A | Discharge status: Home / Self Care | Payer: Medicare Other | Source: Ambulatory Visit | Attending: Neurosurgery | Admitting: Neurosurgery

## 2010-11-27 LAB — CBC
HCT: 49.3 % (ref 39.0–52.0)
MCHC: 35.9 g/dL (ref 30.0–36.0)
MCV: 89.6 fL (ref 78.0–100.0)
Platelets: 142 10*3/uL — ABNORMAL LOW (ref 150–400)
RDW: 12.3 % (ref 11.5–15.5)
WBC: 5.9 10*3/uL (ref 4.0–10.5)

## 2010-11-27 LAB — SURGICAL PCR SCREEN: MRSA, PCR: NEGATIVE

## 2010-11-27 LAB — BASIC METABOLIC PANEL
BUN: 12 mg/dL (ref 6–23)
Chloride: 104 mEq/L (ref 96–112)
Creatinine, Ser: 1.01 mg/dL (ref 0.50–1.35)
GFR calc Af Amer: >60 mL/min (ref >60)
GFR calc non Af Amer: >60 mL/min (ref >60)
Potassium: 4.5 mEq/L (ref 3.5–5.1)

## 2010-11-29 ENCOUNTER — Inpatient Hospital Stay (HOSPITAL_COMMUNITY)
Admission: RE | Admit: 2010-11-29 | Discharge: 2010-11-30 | DRG: 473 | Disposition: A | Discharge status: Home / Self Care | Payer: Medicare Other | Source: Ambulatory Visit | Attending: Neurosurgery | Admitting: Neurosurgery

## 2010-11-29 ENCOUNTER — Inpatient Hospital Stay (HOSPITAL_COMMUNITY): Payer: Medicare Other

## 2010-11-29 DIAGNOSIS — I252 Old myocardial infarction: Secondary | ICD-10-CM

## 2010-11-29 DIAGNOSIS — T84498A Other mechanical complication of other internal orthopedic devices, implants and grafts, initial encounter: Principal | ICD-10-CM | POA: Diagnosis present

## 2010-11-29 DIAGNOSIS — Z85819 Personal history of malignant neoplasm of unspecified site of lip, oral cavity, and pharynx: Secondary | ICD-10-CM

## 2010-11-29 DIAGNOSIS — Y831 Surgical operation with implant of artificial internal device as the cause of abnormal reaction of the patient, or of later complication, without mention of misadventure at the time of the procedure: Secondary | ICD-10-CM | POA: Diagnosis present

## 2010-11-29 DIAGNOSIS — K219 Gastro-esophageal reflux disease without esophagitis: Secondary | ICD-10-CM | POA: Diagnosis present

## 2010-11-29 DIAGNOSIS — F17200 Nicotine dependence, unspecified, uncomplicated: Secondary | ICD-10-CM | POA: Diagnosis present

## 2010-11-29 DIAGNOSIS — I2510 Atherosclerotic heart disease of native coronary artery without angina pectoris: Secondary | ICD-10-CM | POA: Diagnosis present

## 2010-12-07 LAB — URINALYSIS, ROUTINE W REFLEX MICROSCOPIC
Glucose, UA: NEGATIVE
Hgb urine dipstick: NEGATIVE
Specific Gravity, Urine: 1.023
pH: 6

## 2010-12-08 LAB — CBC
HCT: 39.3
MCV: 90.1
RBC: 4.36
WBC: 5.5

## 2010-12-13 LAB — CBC
HCT: 38.7 — ABNORMAL LOW
HCT: 41
HCT: 42.1
Hemoglobin: 13.6
MCHC: 33.1
MCHC: 33.4
MCHC: 33.6
MCV: 86.9
MCV: 88.4
MCV: 88.5
Platelets: 71 — ABNORMAL LOW
Platelets: 78 — ABNORMAL LOW
Platelets: 86 — ABNORMAL LOW
RBC: 4.63
RBC: 4.64
RBC: 4.75
RDW: 16.8 — ABNORMAL HIGH
RDW: 16.8 — ABNORMAL HIGH
RDW: 17.1 — ABNORMAL HIGH
WBC: 4
WBC: 4.3
WBC: 4.7

## 2010-12-13 LAB — GLUCOSE, CAPILLARY
Glucose-Capillary: 101 — ABNORMAL HIGH
Glucose-Capillary: 110 — ABNORMAL HIGH
Glucose-Capillary: 111 — ABNORMAL HIGH
Glucose-Capillary: 128 — ABNORMAL HIGH
Glucose-Capillary: 128 — ABNORMAL HIGH
Glucose-Capillary: 130 — ABNORMAL HIGH
Glucose-Capillary: 141 — ABNORMAL HIGH
Glucose-Capillary: 157 — ABNORMAL HIGH
Glucose-Capillary: 161 — ABNORMAL HIGH
Glucose-Capillary: 95

## 2010-12-13 LAB — COMPREHENSIVE METABOLIC PANEL
ALT: 19
ALT: 30
AST: 15
AST: 19
AST: 22
AST: 24
Albumin: 3.6
Albumin: 3.7
Alkaline Phosphatase: 58
Alkaline Phosphatase: 59
BUN: 17
BUN: 7
CO2: 25
CO2: 26
Calcium: 9.1
Calcium: 9.2
Calcium: 9.3
Chloride: 102
Chloride: 102
Chloride: 105
Creatinine, Ser: 0.72
Creatinine, Ser: 0.76
Creatinine, Ser: 0.86
GFR calc Af Amer: >60
GFR calc Af Amer: >60
GFR calc Af Amer: >60
GFR calc Af Amer: >60
GFR calc non Af Amer: >60
Potassium: 4.3
Potassium: 4.3
Sodium: 136
Sodium: 137
Total Bilirubin: 0.8
Total Bilirubin: 0.8
Total Protein: 5.6 — ABNORMAL LOW

## 2010-12-13 LAB — DIFFERENTIAL
Basophils Relative: 0
Eosinophils Absolute: 0
Eosinophils Absolute: 0
Eosinophils Relative: 0
Eosinophils Relative: 0
Lymphocytes Relative: 7 — ABNORMAL LOW
Lymphs Abs: 0.3 — ABNORMAL LOW
Lymphs Abs: 0.3 — ABNORMAL LOW
Monocytes Absolute: 0.2
Monocytes Absolute: 0.3
Monocytes Relative: 5

## 2010-12-13 LAB — BASIC METABOLIC PANEL
BUN: 10
BUN: 10
Calcium: 8.9
Chloride: 106
GFR calc Af Amer: >60
GFR calc non Af Amer: >60
GFR calc non Af Amer: >60
Glucose, Bld: 106 — ABNORMAL HIGH
Potassium: 3.9
Potassium: 3.9
Sodium: 136

## 2010-12-13 LAB — PROTIME-INR: Prothrombin Time: 13.5

## 2010-12-13 LAB — TRIGLYCERIDES
Triglycerides: 100
Triglycerides: 80

## 2010-12-13 LAB — MAGNESIUM: Magnesium: 2

## 2010-12-13 LAB — CHOLESTEROL, TOTAL: Cholesterol: 215 — ABNORMAL HIGH

## 2011-01-03 NOTE — Op Note (Signed)
  NAME:  Bobby Floyd, Bobby Floyd NO.:  [id number]  MEDICAL RECORD NO.:  [id number]  LOCATION:  3528                         FACILITY:  [facility]  PHYSICIAN:  Coletta Memos, M.D.     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  PROCEDURE: 1. Posterior spinal fusion nonsegmental C5-C6 with vertex lateral mass     screws connected by rod. 2. Arthrodesis with morselized allograft that being Vitoss.  SURGEON:  Coletta Memos, MD  ASSISTANT:  Hewitt Shorts, MD  COMPLICATIONS:  None.  INDICATIONS:  [name] is a gentleman who underwent an anterior cervical decompression and arthrodesis at C5-6 secondary to a herniated disk.  He did well after the operation and he is a smoker and presented to me 2 years later with neck pain.  Plain CT revealed a pseudoarthrosis at that level.  No disk herniations, no canal or foraminal compromise based on both CT and the MRI.  I therefore offered and he agreed to undergo posterior spinal fusion with lateral mass screw fixation.  OPERATIVE NOTE:  [name] was brought to the operating room intubated, placed under general anesthetic without difficulty.  I placed his head in a three pin [place]field head holder and secured all the pins to approximately 60 pounds pressure.  He was turned prone and then connected to the operating room table with his head in slight flexion. I opened the skin with a #10 blade and took this down to the posterior cervical fascia.  I exposed the C4, C5, C6 lamina and spinous processes bilaterally.  I used fluoroscopy to ensure I was at the correct level. I then exposed the lateral mass of the C5 and C6 bilaterally.  I used an awl and created my pilot hole, then drilled 14 mm into the lateral mass 30 degrees out, 30 degrees up through  this level.  I placed 4 screws without difficulty.  I then decorticated the facets between C5-6 bilaterally and packed those with morselized allograft.  I placed the rod, secured that to the screws and then was done with the construct.  Dr. Newell Coral assisted with the instrumentation.  We closed the wound in a layered fashion using Vicryl sutures.  I used staples for the superficial skin.  Sterile dressing was applied.  He tolerated the procedure well.          ______________________________ Coletta Memos, M.D.     KC/MEDQ  D:  11/29/2010  T:  11/29/2010  Job:  [id number]  Electronically Signed by Coletta Memos M.D. on 01/03/2011 08:01:26 PM

## 2011-01-03 NOTE — H&P (Signed)
NAME:  Bobby Floyd, Bobby Floyd NO.:  [id number]  MEDICAL RECORD NO.:  [id number]  LOCATION:  2899                         FACILITY:  [facility]  PHYSICIAN:  Coletta Memos, M.D.     DATE OF BIRTH:  1960/07/06  DATE OF ADMISSION:  11/29/2010 DATE OF DISCHARGE:                             HISTORY & PHYSICAL   ADMISSION DIAGNOSES: 1. 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 2. Cervicalgia.  INDICATIONS:  Bobby Floyd is a 50 year old gentleman who presented to my office for evaluation of severe neck pain on November 21, 2010.  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 He had a radical neck operation in June 2009, at [facility] where he had his jaw also reconstructed.  A year later in December 2010, he underwent a cervical fusion due to a herniated disk at C5-6.  He presented with significant pain in his neck.  He said he had little to no pain in the upper extremities.  He does state that he has numbness and tingling in his fingers.  He has had no bowel or bladder dysfunction.  PAST MEDICAL HISTORY:  Includes myocardial infarction, squamous cell cancer, nephrolithiasis.  He has undergone angioplasty.  He has also undergone radiation treatment for the squamous cell cancer and he has had 2 cycles of chemotherapy.  He underwent a tracheostomy.  He has had basal cell carcinoma found on his extremities 2011-2012.  ALLERGIES:  He has no known drug allergies, but he does have an intolerance to BENADRYL, PHENERGAN and PROPOFOL.  SOCIAL HISTORY:  He still continues to smoke.  He does not use alcohol. He does not use illicit drugs.  REVIEW OF SYSTEMS:  Positive for eyeglasses, kidney stones, arm weakness, back pain, neck pain, excessive thirst, thyroid disease, skin cancer.  He denies constitutional, ear, nose, cardiovascular, respiratory, gastrointestinal, allergic, hematologic or neurological problems.  MEDICATIONS:  He takes Nexium, levothyroxine, lorazepam  hydrocodone.  PHYSICAL EXAMINATION:  VITAL SIGNS:  Pulse 78, he is 6.25 inches tall, he weighs 182 pounds, has a temperature of 97, and a blood pressure of 127/88 with respiratory rate of 20. GENERAL:  He is alert, oriented x4 and he answers all questions appropriately. NEURO:  5/5 strength in the upper and lower extremities.  Multiple well- healed scars in the cervical region.  He has a scar along his chin.  He has an old tracheostomy site.  Good range of motion in the cervical spine.  Pupils equal, round, reactive to light.  Full extraocular movements.  Full visual fields.  Symmetric facial movement and sensation.  Hearing intact to finger rub bilaterally.  Uvula elevates in the midline.  Shoulder shrug is normal.  Tongue protrudes in the midline.  He has a normal gait.  Speech is clear and fluent. Proprioception intact in the upper and lower extremities.  Downgoing toes to plantar stimulation.  No clonus.  No Hoffman sign elicited. Normal gait.  No cervical masses or bruits.  Sclerae not injected. LUNGS:  Lung fields clear. HEART:  Regular rhythm and rate.  No murmurs or rub.  Pulses good at the wrist bilaterally. EXTREMITIES:  No clubbing, cyanosis or edema.  IMAGING:  CT shows a nonunion across  the disk space at C5-6.  MRI shows a tiny central disk at C3-4 not touching the spinal cord.  There is some effacement of the anterior cervical space.  No bony encroachment in the foramen at that level nor is there any disk bulge causing foraminal encroachment.  This is in odds with the official reading by the radiologist.  ASSESSMENT AND PLAN:  Ms. Whiteley has a pseudoarthrosis in C5-6 which could cause the neck pain.  He does not speak of radicular problems. Given the amount of neck pain that he has and the fact that he has a pseudoarthrosis, at least I am going to believe he could have some benefit from a posterior spinal fusion.  It does not guarantee to help, but I think it does  help.  Risks, benefits, bleeding, infection, damage to the nerve roots, paralysis, bowel and bladder dysfunction and continued pseudoarthrosis were discussed.  I told him I would also do a foraminotomy on the right side where he does have some pain that goes down the right upper extremity, by no means this is the majority of his discomfort.  He has agreed and would like to proceed.          ______________________________ Coletta Memos, M.D.     KC/MEDQ  D:  11/29/2010  T:  11/29/2010  Job:  [id number]  Electronically Signed by Coletta Memos M.D. on 01/03/2011 08:02:01 PM

## 2011-01-03 NOTE — Discharge Summary (Signed)
  NAME:  Bobby Floyd, Bobby Floyd NO.:  [id number]  MEDICAL RECORD NO.:  [id number]  LOCATION:  3528                         FACILITY:  [facility]  PHYSICIAN:  Coletta Memos, M.D.     DATE OF BIRTH:  Oct 17, 1960  DATE OF ADMISSION:  11/29/2010 DATE OF DISCHARGE:  11/30/2010                              DISCHARGE SUMMARY   PREOPERATIVE DIAGNOSIS:  Pseudoarthrosis, C5-6.  POSTOPERATIVE DIAGNOSIS:  Pseudoarthrosis, C5-6.  PROCEDURE:  Posterior spinal fusion with lateral mass screws, C5-C6, and packed with morselized allograft, Vitoss.  COMPLICATIONS:  None.  DISCHARGE STATUS:  Alive and well.  Wound, clean, dry, no signs of infection.  5/5 strength in the upper and lower extremities.  Speaking voice is normal.  Mr. Witzke will be discharged home with hydrocodone 10, acetaminophen 325, and Flexeril. He is tolerating a regular diet, has voided, and is ambulating without difficulty.  His operation went well.  He will come in approximately 1 week for staple removal.          ______________________________ Coletta Memos, M.D.     KC/MEDQ  D:  11/30/2010  T:  11/30/2010  Job:  [id number]  Electronically Signed by Coletta Memos M.D. on 01/03/2011 08:02:15 PM

## 2011-03-15 ENCOUNTER — Ambulatory Visit (INDEPENDENT_AMBULATORY_CARE_PROVIDER_SITE_OTHER): Payer: Medicare Other | Admitting: Internal Medicine

## 2011-03-15 VITALS — BP 124/86 | HR 76 | Ht 74.0 in | Wt 177.6 lb

## 2011-03-15 DIAGNOSIS — C099 Malignant neoplasm of tonsil, unspecified: Secondary | ICD-10-CM | POA: Insufficient documentation

## 2011-03-15 DIAGNOSIS — R1319 Other dysphagia: Secondary | ICD-10-CM

## 2011-03-15 DIAGNOSIS — Z1211 Encounter for screening for malignant neoplasm of colon: Secondary | ICD-10-CM

## 2011-03-15 DIAGNOSIS — K219 Gastro-esophageal reflux disease without esophagitis: Secondary | ICD-10-CM

## 2011-03-15 MED ORDER — PEG-KCL-NACL-NASULF-NA ASC-C 100 G PO SOLR: 1.0000 | Freq: Once | ORAL | Status: DC

## 2011-03-15 NOTE — Patient Instructions (Addendum)
You have been scheduled for a Endoscopy/Colonoscopy at [facility] on 03/26/11 at 10:15 am with separate instructions given. Your prep kit has been sent to your pharmacy for you to pick up.

## 2011-03-15 NOTE — Progress Notes (Signed)
Subjective:    Patient ID: Bobby Floyd, male    DOB: [birth year], 51 y.o.   MRN: [id number]  HPI This 51 year old white man presents for evaluation of chronic heartburn and also I think dysphagia. 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 He was treated with surgery radiation and chemotherapy. He is about 3 years out and bili has chronic recurrent dysphagia is doing well. He reports that his otolaryngologist has dilated him several times but he has persistent problems with bread and steak and foods like that. He has to drink water with solid foods that he is able to swallow which are usually softer foods. He lost a great deal of weight during the treatment period and he remains thin. He reports chronic heartburn problems since his surgery and treatment though Nexium controls that most of the time. Dr. Dayton Scrape, his radiation oncologist is suggested he have a screening colonoscopy because of his tonsillar cancer as well as multiple skin cancers including 1 melanoma. He is not yet 52. He denies any colon symptoms or rectal symptoms.  Allergies  Allergen Reactions  . Benadryl (Altaryl)     "crazy"  . Phenergan   . Propofol     "violent"   No outpatient prescriptions prior to visit.   Past Medical History  Diagnosis Date  . Tonsil cancer     left; also in lymph nodes  . Hypothyroidism   . 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 Myocardial infarction     x2  . Basal cell carcinoma of skin   . Melanoma   . S/P coronary angioplasty 2004    [facility]   Past Surgical History  Procedure Date  . Tonsillectomy 2009    left Dr. Gordy Levan [facility]  . Posterior fusion cervical spine September 2012    C5-6  . Pharyngectomy 2009    left Dr. Gordy Levan  . Partial glossectomy     left Dr. Gordy Levan  . Neck dissection     left Dr. Gordy Levan  . Multiple tooth extractions 2009  . Skin cancer excision    Multiple squamous and basal cell carcinomas  . Melanoma excision     Right forearm  . Cervical discectomy 2010    C 5-6   History   Social History  . Marital Status: Divorced    Spouse Name: N/A    Number of Children: N/A  . Years of Education: N/A   Occupational History  . Disabled    Social History Main Topics  . Smoking status: Former Smoker    Types: Cigarettes    Quit date: 08/11/2007  . Smokeless tobacco: Never Used  . Alcohol Use: No  . Drug Use: No  . Sexually Active: None   Other Topics Concern  . None   Social History Narrative   Former Nutritional therapist, he is disabled   Family History  Problem Relation Age of Onset  . Colon cancer Maternal Uncle   . Heart disease Maternal Grandmother   . Cirrhosis Maternal Grandfather     alcoholic  . Cirrhosis Maternal Uncle     alcoholic        Review of Systems All other systems negative or as mentioned above.    Objective:   Physical Exam General:  in no acute distress Eyes:  anicteric. ENT:   Mouth and posterior pharynx free of lesions though  he is a patulous. There is alive at present. Neck:   supple w/o thyromegaly or mass. There is surgical and possibly radiation deformity. There is a tracheostomy    scar. Lungs: Clear to auscultation bilaterally. Heart:  S1S2, no rubs, murmurs, gallops. Abdomen:  soft, non-tender, no hepatosplenomegaly, hernia, or mass and BS+. Gastrostomy scar in the high epigastrium Lymph:  no cervical or supraclavicular adenopathy. Extremities:   no edema Neuro:  A&O x 3.  Psych:  Somewhat odd affect, laughs frequently but overall seems appropriate except for this   Data Reviewed: Office notes from Dr. Dayton Scrape, old hospital records.        Assessment & Plan:   1. Dysphagia   2. GERD (gastroesophageal reflux disease)   3. Special screening for malignant neoplasms, colon    Regarding his dysphagia, it is probably a postoperative and post radiation problem. He must have a cervical  esophageal problem or problem at the upper septal sphincter since he was dilated by his ENT physician. Given the chronic GERD symptoms and his problems it is reasonable to pursue upper endoscopy and to consider esophageal dilation. I have explained risks benefits and indications and he understands and agrees to proceed.  He is not yet 52 but is close and he has a history of a melanoma, multiple squamous cell and basal cell carcinomas of the skin though these are probably related to sun exposure. Taking it on the consideration is close to 52, so a baseline screening colonoscopy is reasonable at this time. This will be arranged as well. Risks benefits and indications are explained he understands and agrees to proceed. He says all of his doctors are recommended he have this done.

## 2011-03-26 ENCOUNTER — Ambulatory Visit (HOSPITAL_COMMUNITY)
Admission: RE | Admit: 2011-03-26 | Discharge: 2011-03-26 | Disposition: A | Discharge status: Home / Self Care | Payer: Medicare Other | Source: Ambulatory Visit | Attending: Internal Medicine | Admitting: Internal Medicine

## 2011-03-26 ENCOUNTER — Encounter (HOSPITAL_COMMUNITY): Payer: Self-pay | Admitting: *Deleted

## 2011-03-26 ENCOUNTER — Encounter (HOSPITAL_COMMUNITY)
Admission: RE | Disposition: A | Discharge status: Home / Self Care | Payer: Self-pay | Source: Ambulatory Visit | Attending: Internal Medicine

## 2011-03-26 ENCOUNTER — Other Ambulatory Visit: Payer: Self-pay | Admitting: Internal Medicine

## 2011-03-26 DIAGNOSIS — R1310 Dysphagia, unspecified: Secondary | ICD-10-CM | POA: Insufficient documentation

## 2011-03-26 DIAGNOSIS — R1319 Other dysphagia: Secondary | ICD-10-CM

## 2011-03-26 DIAGNOSIS — Z1211 Encounter for screening for malignant neoplasm of colon: Secondary | ICD-10-CM

## 2011-03-26 DIAGNOSIS — IMO0001 Reserved for inherently not codable concepts without codable children: Secondary | ICD-10-CM | POA: Insufficient documentation

## 2011-03-26 DIAGNOSIS — D126 Benign neoplasm of colon, unspecified: Secondary | ICD-10-CM | POA: Diagnosis not present

## 2011-03-26 DIAGNOSIS — R1314 Dysphagia, pharyngoesophageal phase: Secondary | ICD-10-CM | POA: Diagnosis present

## 2011-03-26 DIAGNOSIS — K219 Gastro-esophageal reflux disease without esophagitis: Secondary | ICD-10-CM

## 2011-03-26 HISTORY — DX: Gastro-esophageal reflux disease without esophagitis: K21.9

## 2011-03-26 HISTORY — PX: BALLOON DILATION: SHX5330

## 2011-03-26 HISTORY — PX: COLONOSCOPY: SHX5424

## 2011-03-26 HISTORY — PX: ESOPHAGOGASTRODUODENOSCOPY: SHX5428

## 2011-03-26 HISTORY — DX: Atherosclerotic heart disease of native coronary artery without angina pectoris: I25.10

## 2011-03-26 SURGERY — EGD (ESOPHAGOGASTRODUODENOSCOPY)
Anesthesia: Moderate Sedation

## 2011-03-26 MED ORDER — FENTANYL NICU IV SYRINGE 50 MCG/ML
INJECTION | INTRAMUSCULAR | Status: DC | PRN
  Administered 2011-03-26 (×5): 25 ug via INTRAVENOUS

## 2011-03-26 MED ORDER — SODIUM CHLORIDE 0.9 % IV SOLN
Freq: Once | INTRAVENOUS | Status: AC
  Administered 2011-03-26: 500 mL via INTRAVENOUS

## 2011-03-26 MED ORDER — MIDAZOLAM HCL 10 MG/2ML IJ SOLN
INTRAMUSCULAR | Status: AC
  Filled 2011-03-26: qty 4

## 2011-03-26 MED ORDER — MIDAZOLAM HCL 10 MG/2ML IJ SOLN
INTRAMUSCULAR | Status: DC | PRN
  Administered 2011-03-26: 2 mg via INTRAVENOUS
  Administered 2011-03-26 (×2): 1 mg via INTRAVENOUS
  Administered 2011-03-26: 2 mg via INTRAVENOUS
  Administered 2011-03-26: 1 mg via INTRAVENOUS
  Administered 2011-03-26 (×3): 2 mg via INTRAVENOUS

## 2011-03-26 MED ORDER — FENTANYL CITRATE 0.05 MG/ML IJ SOLN
INTRAMUSCULAR | Status: AC
  Filled 2011-03-26: qty 4

## 2011-03-26 NOTE — Interval H&P Note (Signed)
History and Physical Interval Note:  03/26/2011 10:28 AM  Bobby Floyd  has presented today for surgery, with the diagnosis of Screening for colon cancer [V76.51] Dysphagia [787.29]  The various methods of treatment have been discussed with the patient and family. After consideration of risks, benefits and other options for treatment, the patient has consented to  Procedure(s): ESOPHAGOGASTRODUODENOSCOPY (EGD) BALLOON DILATION COLONOSCOPY as a surgical intervention .  The patients' history has been reviewed, patient examined, no change in status, stable for surgery.  I have reviewed the patients' chart and labs.  Questions were answered to the patient's satisfaction.     Stan Head

## 2011-03-26 NOTE — Op Note (Signed)
[facility] [address] [place], [place]  [address]  ENDOSCOPY PROCEDURE REPORT  PATIENT:  Bobby Floyd, Bobby Floyd  MR#:  [id number] BIRTHDATE:  1960-05-07, 50 yrs. old  GENDER:  male  ENDOSCOPIST:  Bobby Boop, MD, [facility] Referred by:  Chipper Herb, M.D.  PROCEDURE DATE:  03/26/2011 PROCEDURE:  EGD, diagnostic 43235 ASA CLASS:  Class II INDICATIONS:  dysphagia  MEDICATIONS:   Fentanyl 50 mcg IV, Versed 5 mg IV TOPICAL ANESTHETIC:  Cetacaine Spray  DESCRIPTION OF PROCEDURE:   After the risks benefits and alternatives of the procedure were thoroughly explained, informed consent was obtained.  The EG-[id number] ([id number]) endoscope was introduced through the mouth and advanced to the second portion of the duodenum, without limitations.  The instrument was slowly withdrawn as the mucosa was fully examined. <<PROCEDUREIMAGES>>  Post-operative changes in pharyngeal area were noted but no stricture or stenosis seen. The upper, middle, and distal third of the esophagus were carefully inspected and no abnormalities were noted. The z-line was well seen at the GEJ. The endoscope was pushed into the fundus which was normal including a retroflexed view. The antrum,gastric body, first and second part of the duodenum were unremarkable.    Retroflexed views revealed no abnormalities.    The scope was then withdrawn from the patient and the procedure completed.  COMPLICATIONS:  None  ENDOSCOPIC IMPRESSION: 1) Normal EGD except post-op pharyngeal changes, no obvious stricture seen. RECOMMENDATIONS: Follow-up with ENT re: any further dilation. I do not see an area to dilate and suspect chronic post-operative and XRT changes that may not be able to be improved upon. He has had numerous dilations in past by report.  Bobby Boop, MD, [facility]  CC:  The Patient and Chipper Herb MD  n. Rosalie Doctor:   Bobby Floyd at 03/26/2011 10:59 AM  Lavena Stanford, [phone]

## 2011-03-26 NOTE — H&P (View-Only) (Signed)
Subjective:    Patient ID: Bobby Floyd, male    DOB: 01/15/1961, 50 y.o.   MRN: [id number]  HPI This 50-year-old white man presents for evaluation of chronic heartburn and also I think dysphagia. 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 He was treated with surgery radiation and chemotherapy. He is about 3 years out and bili has chronic recurrent dysphagia is doing well. He reports that his otolaryngologist has dilated him several times but he has persistent problems with bread and steak and foods like that. He has to drink water with solid foods that he is able to swallow which are usually softer foods. He lost a great deal of weight during the treatment period and he remains thin. He reports chronic heartburn problems since his surgery and treatment though Nexium controls that most of the time. Dr. Murray, his radiation oncologist is suggested he have a screening colonoscopy because of his tonsillar cancer as well as multiple skin cancers including 1 melanoma. He is not yet 52. He denies any colon symptoms or rectal symptoms.  Allergies  Allergen Reactions  . Benadryl (Altaryl)     "crazy"  . Phenergan   . Propofol     "violent"   No outpatient prescriptions prior to visit.   Past Medical History  Diagnosis Date  . Tonsil cancer     left; also in lymph nodes  . Hypothyroidism   . 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 Myocardial infarction     x2  . Basal cell carcinoma of skin   . Melanoma   . S/P coronary angioplasty 2004    [facility]   Past Surgical History  Procedure Date  . Tonsillectomy 2009    left Dr. Walton [facility]  . Posterior fusion cervical spine September 2012    C5-6  . Pharyngectomy 2009    left Dr. Walton  . Partial glossectomy     left Dr. Walton  . Neck dissection     left Dr. Walton  . Multiple tooth extractions 2009  . Skin cancer excision    Multiple squamous and basal cell carcinomas  . Melanoma excision     Right forearm  . Cervical discectomy 2010    C 5-6   History   Social History  . Marital Status: Divorced    Spouse Name: N/A    Number of Children: N/A  . Years of Education: N/A   Occupational History  . Disabled    Social History Main Topics  . Smoking status: Former Smoker    Types: Cigarettes    Quit date: 08/11/2007  . Smokeless tobacco: Never Used  . Alcohol Use: No  . Drug Use: No  . Sexually Active: None   Other Topics Concern  . None   Social History Narrative   Former plumber, he is disabled   Family History  Problem Relation Age of Onset  . Colon cancer Maternal Uncle   . Heart disease Maternal Grandmother   . Cirrhosis Maternal Grandfather     alcoholic  . Cirrhosis Maternal Uncle     alcoholic        Review of Systems All other systems negative or as mentioned above.    Objective:   Physical Exam General:  in no acute distress Eyes:  anicteric. ENT:   Mouth and posterior pharynx free of lesions though   he is a patulous. There is alive at present. Neck:   supple w/o thyromegaly or mass. There is surgical and possibly radiation deformity. There is a tracheostomy    scar. Lungs: Clear to auscultation bilaterally. Heart:  S1S2, no rubs, murmurs, gallops. Abdomen:  soft, non-tender, no hepatosplenomegaly, hernia, or mass and BS+. Gastrostomy scar in the high epigastrium Lymph:  no cervical or supraclavicular adenopathy. Extremities:   no edema Neuro:  A&O x 3.  Psych:  Somewhat odd affect, laughs frequently but overall seems appropriate except for this   Data Reviewed: Office notes from Dr. Murray, old hospital records.        Assessment & Plan:   1. Dysphagia   2. GERD (gastroesophageal reflux disease)   3. Special screening for malignant neoplasms, colon    Regarding his dysphagia, it is probably a postoperative and post radiation problem. He must have a cervical  esophageal problem or problem at the upper septal sphincter since he was dilated by his ENT physician. Given the chronic GERD symptoms and his problems it is reasonable to pursue upper endoscopy and to consider esophageal dilation. I have explained risks benefits and indications and he understands and agrees to proceed.  He is not yet 52 but is close and he has a history of a melanoma, multiple squamous cell and basal cell carcinomas of the skin though these are probably related to sun exposure. Taking it on the consideration is close to 52, so a baseline screening colonoscopy is reasonable at this time. This will be arranged as well. Risks benefits and indications are explained he understands and agrees to proceed. He says all of his doctors are recommended he have this done. 

## 2011-03-26 NOTE — Op Note (Signed)
[facility] [address] [place], [place]  [address]  COLONOSCOPY PROCEDURE REPORT  PATIENT:  Bobby, Floyd  MR#:  [id number] BIRTHDATE:  [birth year], 50 yrs. old  GENDER:  male ENDOSCOPIST:  Iva Boop, MD, [facility] REF. BY:  Chipper Herb, M.D. PROCEDURE DATE:  03/26/2011 PROCEDURE:  Colonoscopy with snare polypectomy ASA CLASS:  Class II INDICATIONS:  colorectal cancer screening, average risk MEDICATIONS:   There was residual sedation effect present from prior procedure., Fentanyl 75 mcg IV, Versed 8 mg IV  DESCRIPTION OF PROCEDURE:   After the risks benefits and alternatives of the procedure were thoroughly explained, informed consent was obtained.  Digital rectal exam was performed and revealed no abnormalities and normal prostate.   The [id number] ([id number]) endoscope was introduced through the anus and advanced to the cecum, which was identified by both the appendix and ileocecal valve, without limitations.  The quality of the prep was good, using MoviPrep.  The instrument was then slowly withdrawn as the colon was fully examined. <<PROCEDUREIMAGES>>  FINDINGS:  A sessile polyp was found in the proximal transverse colon. It was 6 mm in size. Polyp was snared without cautery. Retrieval was successful. snare polyp  This was otherwise a normal examination of the colon.   Retroflexed views in the rectum revealed no abnormalities.    The time to cecum = 4 minutes. The scope was then withdrawn in 12 minutes from the cecum and the procedure completed. COMPLICATIONS:  None ENDOSCOPIC IMPRESSION: 1) 6 mm sessile polyp in the proximal transverse colon - removed  2) Otherwise normal examination with good prep RECOMMENDATIONS: 1) Repeat colonoscopy timing depends upon polyp pathology results, will notify the patient.  Iva Boop, MD, [facility]  CC:  Chipper Herb, MD The Patient  n. eSIGNED:   Iva Boop at 03/26/2011 11:24 AM  Lavena Stanford,  [phone]

## 2011-03-28 ENCOUNTER — Encounter: Payer: Self-pay | Admitting: Internal Medicine

## 2011-03-28 ENCOUNTER — Encounter (HOSPITAL_COMMUNITY): Payer: Self-pay | Admitting: Internal Medicine

## 2011-03-28 DIAGNOSIS — Z860100 Personal history of colon polyps, unspecified: Secondary | ICD-10-CM | POA: Insufficient documentation

## 2011-03-28 NOTE — Progress Notes (Signed)
Quick Note:  6 mm adenoma Recall colonoscopy 03/2016 - 5 years ______

## 2011-04-03 ENCOUNTER — Encounter: Payer: Self-pay | Admitting: *Deleted

## 2011-04-17 ENCOUNTER — Encounter: Payer: Self-pay | Admitting: Radiation Oncology

## 2011-04-17 ENCOUNTER — Ambulatory Visit
Admission: RE | Admit: 2011-04-17 | Discharge: 2011-04-17 | Disposition: A | Discharge status: Home / Self Care | Payer: Medicare Other | Source: Ambulatory Visit | Attending: Radiation Oncology | Admitting: Radiation Oncology

## 2011-04-17 VITALS — BP 142/94 | HR 92 | Temp 98.1°F | Resp 18 | Wt 183.4 lb

## 2011-04-17 DIAGNOSIS — C099 Malignant neoplasm of tonsil, unspecified: Secondary | ICD-10-CM

## 2011-04-17 NOTE — Progress Notes (Signed)
Patient presented to the clinic today unaccompanied for a scheduled follow up appointment with Dr. Dayton Scrape. Patient is alert and oriented to person, place, and time. No distress noted. Steady gait noted. Pleasant affect noted. Patient denies pain at this time. Patient reports that he is scheduled to follow up with his surgeon, Dr. Gordy Levan, on 2/18 and his oncologist, Dr. Gilman Buttner this week. Patient reports that an endoscopy was done in January but, "they were afraid to dilate." Patient reports that he continues to eat a soft diet because "food continues to get caught when he swallows." Patient denies nausea, vomiting, diarrhea, headache, or cough. Patient reports that his dry mouth is getting better. Patient states, "I am getting saliva." Patient reports that he hopes Dr. Dayton Scrape will clear him to go on a mission trip in October. Reported all findings to Dr. Dayton Scrape.

## 2011-04-17 NOTE — Progress Notes (Signed)
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 He still doing well. He will see Dr.Waltonen at [facility] on February 18. He'll see Dr. Gilman Buttner for medical oncology followup this Thursday. He had upper and lower endoscopy with Dr. Leone Payor on January 14. His colonoscopy was remarkable for a benign proximal transverse colon polyp. He has a known esophageal stricture which was not dilated because of the risk for perforation, according to the patient. He maintains his dental followup and was seen by his dentist 2 weeks ago. I should mention that he had surgical stabilization of his neck with Dr. Franky Macho in September of 2012. He continues to have chronic cervical neck pain.  Physical examination: Nodes: There is no palpable lymphadenopathy in the neck. Oral cavity and oropharynx remarkable for mild trismus and mild xerostomia. Surgical changes are noted along the left oropharynx. No visible or palpable evidence for recurrent disease. Indirect mirror examination confirmatory.  Impression: No evidence for recurrent disease. If he sees Dr. Hezzie Bump again in 3 months, I can see him for a followup visit in 6 months.

## 2011-04-20 ENCOUNTER — Other Ambulatory Visit: Payer: Medicare Other

## 2011-04-20 ENCOUNTER — Ambulatory Visit: Payer: Medicare Other | Admitting: Oncology

## 2011-04-21 NOTE — Progress Notes (Signed)
No show

## 2011-04-30 DIAGNOSIS — C099 Malignant neoplasm of tonsil, unspecified: Secondary | ICD-10-CM | POA: Insufficient documentation

## 2011-06-11 ENCOUNTER — Encounter (HOSPITAL_COMMUNITY): Payer: Self-pay | Admitting: Dentistry

## 2011-06-11 ENCOUNTER — Ambulatory Visit (HOSPITAL_COMMUNITY): Payer: Medicare Other | Admitting: Dentistry

## 2011-06-11 VITALS — BP 119/84 | HR 82 | Temp 97.3°F

## 2011-06-11 DIAGNOSIS — Z85819 Personal history of malignant neoplasm of unspecified site of lip, oral cavity, and pharynx: Secondary | ICD-10-CM

## 2011-06-11 DIAGNOSIS — Z09 Encounter for follow-up examination after completed treatment for conditions other than malignant neoplasm: Secondary | ICD-10-CM

## 2011-06-11 DIAGNOSIS — K08199 Complete loss of teeth due to other specified cause, unspecified class: Secondary | ICD-10-CM

## 2011-06-11 HISTORY — PX: CORONARY ANGIOPLASTY: SHX604

## 2011-06-11 MED ORDER — SODIUM FLUORIDE 1.1 % DT CREA: TOPICAL_CREAM | DENTAL | Status: DC

## 2011-06-11 NOTE — Progress Notes (Signed)
Periodic oral examination;  Date of Consultation:  06/11/2011 Patient Name:   Bobby Floyd Date of Birth:   [birth year] Medical Record Number: [id number]  VITALS: BP 119/84  Pulse 82  Temp 97.3 F (36.3 C)   CHIEF COMPLAINT: Patient is complaining of exposed bone on the upper left quadrant edentulous areas.   HPI: Patient was recently seen by dentist in [place], [place] for a dental cleaning. During the cleaning appointment he was informed that he had an exposed bone of the upper left quadrant edentulous area. Patient was told to follow up with an oral surgeon although no referral was made by patient report. Patient also sees a Education officer, community at the [place] [facility] [employer] [facility] for recall of his lower partial denture. Patient did not follow up with them either and is due to see them in June of 2013. Patient is due to see Dr. Loleta Dicker (ENT)in June by patient report.   PMH: Past Medical History  Diagnosis Date  . Hypothyroidism   . 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 Myocardial infarction     x2  . S/P coronary angioplasty 2004    [facility]  . Degenerative joint disease of cervical spine   . GERD (gastroesophageal reflux disease)   . Coronary artery disease   . Basal cell carcinoma of skin   . Melanoma   . Tonsillar cancer     Squamous cell, on the left, stage IV    PSH: Past Surgical History  Procedure Date  . Tonsillectomy 2009    left Dr. Gordy Levan [facility]  . Posterior fusion cervical spine September 2012    C5-6  . Pharyngectomy 2009    left Dr. Gordy Levan  . Partial glossectomy     left Dr. Gordy Levan  . Neck dissection     left Dr. Gordy Levan  . Multiple tooth extractions 2009  . Skin cancer excision     Multiple squamous and basal cell carcinomas  . Melanoma excision     Right forearm  . Cervical discectomy 2010    C 5-6  . Esophagogastroduodenoscopy 03/26/2011      Procedure: ESOPHAGOGASTRODUODENOSCOPY (EGD);  Surgeon: Iva Boop, MD;  Location: [facility];  Service: Endoscopy;  Laterality: N/A;  . Balloon dilation 03/26/2011    Procedure: BALLOON DILATION;  Surgeon: Iva Boop, MD;  Location: [facility];  Service: Endoscopy;  Laterality: N/A;  . Colonoscopy 03/26/2011    Procedure: COLONOSCOPY;  Surgeon: Iva Boop, MD;  Location: [facility];  Service: Endoscopy;  Laterality: N/A;    ALLERGIES: Allergies  Allergen Reactions  . Benadryl (Altaryl)     "crazy"  . Ondansetron Other (See Comments)    Headaches  . Phenergan   . Propofol     "violent"    MEDICATIONS: Current Outpatient Prescriptions  Medication Sig Dispense Refill  . esomeprazole (NEXIUM) 40 MG capsule Take 40 mg by mouth daily before breakfast.       . HYDROcodone-acetaminophen (NORCO) 10-325 MG per tablet Take 1 tablet by mouth every 6 (six) hours as needed.        Marland Kitchen levothyroxine (SYNTHROID, LEVOTHROID) 100 MCG tablet Take 100 mcg by mouth daily.        Marland Kitchen LORazepam (ATIVAN) 1 MG tablet Take 2 tablets at bedtime       . AMBULATORY NON FORMULARY MEDICATION Medication Name: Merinol  Take as needed       . sodium fluoride (PREVIDENT 5000 PLUS) 1.1 % CREA dental cream Apply a thin ribbon of toothpaste to toothbrush at bedtime. Brush for 2 minutes. Spit out excess. Do not swallow.  Repeat nightly.  1 Tube  11    SOCIAL HISTORY: History   Social History  . Marital Status: Divorced    Spouse Name: N/A    Number of Children: N/A  . Years of Education: N/A   Occupational History  . Disabled    Social History Main Topics  . Smoking status: Former Smoker -- 1.5 packs/day for 20 years    Types: Cigarettes    Quit date: 08/11/2007  . Smokeless tobacco: Never Used  . Alcohol Use: No  . Drug Use: No  . Sexually Active: Not on file   Other Topics Concern  . Not on file   Social History Narrative   Former Nutritional therapist, he is disabled    FAMILY  HISTORY: Family History  Problem Relation Age of Onset  . Colon cancer Maternal Uncle   . Heart disease Maternal Grandmother   . Cirrhosis Maternal Grandfather     alcoholic  . Cirrhosis Maternal Uncle     alcoholic  . Hypothyroidism Mother   . Goiter Mother      REVIEW OF SYSTEMS: Reviewed from chart for this admission.  DENTAL HISTORY: CHIEF COMPLAINT: Patient is complaining of exposed bone on the upper left quadrant edentulous areas.   HPI: Patient was recently seen by dentist in [place], [place] for a dental cleaning. During the cleaning appointment he was informed that he had an exposed bone of the upper left quadrant edentulous area. Patient was told to follow up with an oral surgeon although no referral was made by patient report. Patient also sees a Education officer, community at the [place] [facility] [employer] [facility] for recall of his lower partial denture. Patient did not follow up with them either and is due to see them in June of 2013. Patient is due to see Dr. Loleta Dicker (ENT)in June by patient report.   DENTAL EXAMINATION:  GENERAL: Patient is a well-developed, well-nourished male in no acute distress. HEAD AND NECK: There is no obvious the mandibular lymphadenopathy. The patient denies acute TMJ symptoms. INTRAORAL EXAM: Patient has xerostomia. There is no evidence of exposed bone in the upper left quadrant or any other maxillary or mandibular quadrant. DENTITION: Patient is missing tooth numbers 5, 12, 15, 16, 17, 18, 19, 20, 21, 22, 28. Multiple spaces have been closed after orthodontic therapy previously. PERIODONTAL: Patient has chronic periodontitis with minimal plaque accumulations, selective areas gingival recession and some incipient tooth mobility. Patient indicates he is seen on an every 3 month basis due to his xerostomia. This is performed by a Customer service manager in [facility]. DENTAL CARIES/SUBOPTIMAL RESTORATIONS: There are no dental caries are obvious  suboptimal restorations noted at this time. ENDODONTIC: Patient denies acute pulpitis symptoms. Patient has had previous root canal therapies associated with tooth #29. There is no evidence of persistent periapical pathology. CROWN AND BRIDGE: There are multiple crown restorations noted that appear to be acceptable. PROSTHODONTIC: Patient has lower partial denture fabricated at the [facility] that appears to be acceptable. OCCLUSION: The occlusion is stable.  RADIOGRAPHIC INTERPRETATION: A panoramic x-ray was obtained today. There are multiple missing teeth. Spaces have been closed after orthodontic therapy for the most part. (Other missing teeth or replace with the lower partial denture) There  are no obvious periapical radiolucencies. There is a previous root canal therapies associated with tooth #29 with no obvious persistent periapical pathology. There are multiple restorations noted.  ASSESSMENTS: 1. Xerostomia 2. Risk for dental caries secondary to the xerostomia. 3. No evidence of exposed bone. 4. Gingival recession 5. Minimal plaque accumulations. 6. Incipient tooth mobility. 7. Stable occlusion after orthodontic therapy and lower partial denture fabrication.    PLAN/RECOMMENDATIONS: 1. I discussed the risks, benefits, and complications of various treatment options with the patient in relationship to the medical and dental conditions. I do NOT see any exposed bone at this time involving the upper left quadrant or any other mandibular or maxillary quadrant. Patient is to followup with the general dentist of his choice. Patient expressed an interest in following up with Dr. Billey Gosling for dental care. A release of information was signed. Patient is to contact Dr. Billey Gosling to arrange for an appointment. Records and will be sent as per release of information signed today.  2. Discussion of findings with medical and dental team members and coordination of future  medical and dental care as indicated.  Charlynne Pander, DDS

## 2011-06-24 ENCOUNTER — Other Ambulatory Visit: Payer: Self-pay

## 2011-06-24 ENCOUNTER — Ambulatory Visit (HOSPITAL_COMMUNITY): Admit: 2011-06-24 | Payer: Self-pay | Admitting: Cardiology

## 2011-06-24 ENCOUNTER — Encounter (HOSPITAL_COMMUNITY): Payer: Self-pay | Admitting: *Deleted

## 2011-06-24 ENCOUNTER — Inpatient Hospital Stay (HOSPITAL_COMMUNITY)
Admission: EM | Admit: 2011-06-24 | Discharge: 2011-06-27 | DRG: 251 | Disposition: A | Discharge status: Home / Self Care | Payer: Medicare Other | Attending: Cardiology | Admitting: Cardiology

## 2011-06-24 ENCOUNTER — Emergency Department (HOSPITAL_COMMUNITY): Payer: Medicare Other

## 2011-06-24 ENCOUNTER — Encounter (HOSPITAL_COMMUNITY)
Admission: EM | Disposition: A | Discharge status: Home / Self Care | Payer: Self-pay | Source: Home / Self Care | Attending: Cardiology

## 2011-06-24 DIAGNOSIS — E7800 Pure hypercholesterolemia, unspecified: Secondary | ICD-10-CM | POA: Diagnosis present

## 2011-06-24 DIAGNOSIS — R079 Chest pain, unspecified: Secondary | ICD-10-CM

## 2011-06-24 DIAGNOSIS — E039 Hypothyroidism, unspecified: Secondary | ICD-10-CM | POA: Diagnosis present

## 2011-06-24 DIAGNOSIS — F411 Generalized anxiety disorder: Secondary | ICD-10-CM | POA: Diagnosis present

## 2011-06-24 DIAGNOSIS — I2510 Atherosclerotic heart disease of native coronary artery without angina pectoris: Secondary | ICD-10-CM | POA: Diagnosis present

## 2011-06-24 DIAGNOSIS — K219 Gastro-esophageal reflux disease without esophagitis: Secondary | ICD-10-CM | POA: Diagnosis present

## 2011-06-24 DIAGNOSIS — I200 Unstable angina: Secondary | ICD-10-CM

## 2011-06-24 DIAGNOSIS — F419 Anxiety disorder, unspecified: Secondary | ICD-10-CM

## 2011-06-24 DIAGNOSIS — Z85819 Personal history of malignant neoplasm of unspecified site of lip, oral cavity, and pharynx: Secondary | ICD-10-CM

## 2011-06-24 DIAGNOSIS — I214 Non-ST elevation (NSTEMI) myocardial infarction: Principal | ICD-10-CM | POA: Diagnosis present

## 2011-06-24 LAB — APTT
aPTT: >200 seconds (ref 24–37)
aPTT: 76 seconds — ABNORMAL HIGH (ref 24–37)

## 2011-06-24 LAB — CBC
Hemoglobin: 15.5 g/dL (ref 13.0–17.0)
Hemoglobin: 15.4 g/dL (ref 13.0–17.0)
MCHC: 35.4 g/dL (ref 30.0–36.0)
RBC: 4.92 MIL/uL (ref 4.22–5.81)
RDW: 13.6 % (ref 11.5–15.5)
WBC: 7 10*3/uL (ref 4.0–10.5)
WBC: 10 10*3/uL (ref 4.0–10.5)

## 2011-06-24 LAB — DIFFERENTIAL
Basophils Absolute: 0 10*3/uL (ref 0.0–0.1)
Basophils Relative: 0 % (ref 0–1)
Lymphocytes Relative: 7 % — ABNORMAL LOW (ref 12–46)
Monocytes Relative: 6 % (ref 3–12)
Neutro Abs: 8.6 10*3/uL — ABNORMAL HIGH (ref 1.7–7.7)
Neutrophils Relative %: 86 % — ABNORMAL HIGH (ref 43–77)

## 2011-06-24 LAB — POCT I-STAT, CHEM 8
BUN: 9 mg/dL (ref 6–23)
Chloride: 109 mEq/L (ref 96–112)
Sodium: 140 mEq/L (ref 135–145)

## 2011-06-24 LAB — COMPREHENSIVE METABOLIC PANEL
ALT: 11 U/L (ref 0–53)
ALT: 11 U/L (ref 0–53)
AST: 21 U/L (ref 0–37)
Albumin: 4 g/dL (ref 3.5–5.2)
Alkaline Phosphatase: 85 U/L (ref 39–117)
Alkaline Phosphatase: 85 U/L (ref 39–117)
CO2: 21 mEq/L (ref 19–32)
Chloride: 105 mEq/L (ref 96–112)
GFR calc Af Amer: >90 mL/min (ref >90)
GFR calc non Af Amer: >90 mL/min (ref >90)
Glucose, Bld: 135 mg/dL — ABNORMAL HIGH (ref 70–99)
Potassium: 3.7 mEq/L (ref 3.5–5.1)
Potassium: 3.8 mEq/L (ref 3.5–5.1)
Sodium: 136 mEq/L (ref 135–145)
Sodium: 137 mEq/L (ref 135–145)
Total Protein: 6.4 g/dL (ref 6.0–8.3)

## 2011-06-24 LAB — PROTIME-INR: INR: 1 (ref 0.00–1.49)

## 2011-06-24 LAB — HEPARIN LEVEL (UNFRACTIONATED): Heparin Unfractionated: 0.3 IU/mL (ref 0.30–0.70)

## 2011-06-24 LAB — HEMOGLOBIN A1C: Mean Plasma Glucose: 105 mg/dL (ref <117)

## 2011-06-24 LAB — CARDIAC PANEL(CRET KIN+CKTOT+MB+TROPI)
Total CK: 120 U/L (ref 7–232)
Troponin I: <0.3 ng/mL (ref <0.30)

## 2011-06-24 LAB — POCT I-STAT TROPONIN I: Troponin i, poc: 0.02 ng/mL (ref 0.00–0.08)

## 2011-06-24 SURGERY — LEFT HEART CATHETERIZATION WITH CORONARY ANGIOGRAM
Anesthesia: LOCAL

## 2011-06-24 MED ORDER — LEVOTHYROXINE SODIUM 100 MCG PO TABS
100.0000 ug | ORAL_TABLET | Freq: Every day | ORAL | Status: DC
  Administered 2011-06-25: 100 ug via ORAL
  Filled 2011-06-24 (×2): qty 1

## 2011-06-24 MED ORDER — SODIUM CHLORIDE 0.9 % IV SOLN: 250.0000 mL | INTRAVENOUS | Status: DC | PRN

## 2011-06-24 MED ORDER — NITROGLYCERIN 0.4 MG SL SUBL: 0.4000 mg | SUBLINGUAL_TABLET | SUBLINGUAL | Status: DC | PRN

## 2011-06-24 MED ORDER — NITROGLYCERIN IN D5W 200-5 MCG/ML-% IV SOLN
3.0000 ug/min | INTRAVENOUS | Status: DC
  Administered 2011-06-24: 15 ug/min via INTRAVENOUS

## 2011-06-24 MED ORDER — ASPIRIN EC 81 MG PO TBEC
81.0000 mg | DELAYED_RELEASE_TABLET | Freq: Every day | ORAL | Status: DC
  Filled 2011-06-24: qty 1

## 2011-06-24 MED ORDER — IOHEXOL 350 MG/ML SOLN
100.0000 mL | Freq: Once | INTRAVENOUS | Status: AC | PRN
  Administered 2011-06-24: 100 mL via INTRAVENOUS

## 2011-06-24 MED ORDER — HYDROMORPHONE HCL PF 1 MG/ML IJ SOLN
1.0000 mg | Freq: Once | INTRAMUSCULAR | Status: AC
  Administered 2011-06-24: 1 mg via INTRAVENOUS
  Filled 2011-06-24: qty 1

## 2011-06-24 MED ORDER — NITROGLYCERIN IN D5W 200-5 MCG/ML-% IV SOLN
INTRAVENOUS | Status: AC
  Administered 2011-06-24: 10 ug/min via INTRAVENOUS
  Filled 2011-06-24: qty 250

## 2011-06-24 MED ORDER — ALUM & MAG HYDROXIDE-SIMETH 200-200-20 MG/5ML PO SUSP
ORAL | Status: AC
  Administered 2011-06-24: 30 mL
  Filled 2011-06-24: qty 30

## 2011-06-24 MED ORDER — MORPHINE SULFATE 2 MG/ML IJ SOLN
2.0000 mg | INTRAMUSCULAR | Status: DC | PRN
  Administered 2011-06-24 – 2011-06-25 (×4): 2 mg via INTRAVENOUS
  Filled 2011-06-24 (×4): qty 1

## 2011-06-24 MED ORDER — SODIUM CHLORIDE 0.9 % IV SOLN
INTRAVENOUS | Status: DC
  Administered 2011-06-24: 20 mL/h via INTRAVENOUS

## 2011-06-24 MED ORDER — PANTOPRAZOLE SODIUM 40 MG PO TBEC
40.0000 mg | DELAYED_RELEASE_TABLET | Freq: Every day | ORAL | Status: DC
  Administered 2011-06-26 – 2011-06-27 (×2): 40 mg via ORAL
  Filled 2011-06-24 (×2): qty 1

## 2011-06-24 MED ORDER — HEPARIN (PORCINE) IN NACL 100-0.45 UNIT/ML-% IJ SOLN
1000.0000 [IU]/h | INTRAMUSCULAR | Status: DC
  Administered 2011-06-24: 1000 [IU]/h via INTRAVENOUS
  Filled 2011-06-24 (×3): qty 250

## 2011-06-24 MED ORDER — HEPARIN SODIUM (PORCINE) 5000 UNIT/ML IJ SOLN
INTRAMUSCULAR | Status: AC
  Filled 2011-06-24: qty 1

## 2011-06-24 MED ORDER — ASPIRIN 81 MG PO CHEW: 324.0000 mg | CHEWABLE_TABLET | ORAL | Status: DC

## 2011-06-24 MED ORDER — ASPIRIN 81 MG PO CHEW: 324.0000 mg | CHEWABLE_TABLET | Freq: Once | ORAL | Status: DC

## 2011-06-24 MED ORDER — HEPARIN SODIUM (PORCINE) 1000 UNIT/ML IJ SOLN
4000.0000 [IU] | Freq: Once | INTRAMUSCULAR | Status: AC
  Administered 2011-06-24: 4000 [IU] via INTRAVENOUS

## 2011-06-24 MED ORDER — ASPIRIN 300 MG RE SUPP: 300.0000 mg | RECTAL | Status: DC

## 2011-06-24 MED ORDER — ASPIRIN 81 MG PO CHEW
324.0000 mg | CHEWABLE_TABLET | ORAL | Status: AC
  Administered 2011-06-25: 324 mg via ORAL
  Filled 2011-06-24: qty 4

## 2011-06-24 MED ORDER — LORAZEPAM 1 MG PO TABS
2.0000 mg | ORAL_TABLET | Freq: Every day | ORAL | Status: DC
  Administered 2011-06-24: 2 mg via ORAL
  Filled 2011-06-24: qty 2

## 2011-06-24 MED ORDER — SODIUM CHLORIDE 0.9 % IV SOLN
INTRAVENOUS | Status: DC
  Administered 2011-06-24: 13:00:00 via INTRAVENOUS

## 2011-06-24 MED ORDER — NITROGLYCERIN IN D5W 200-5 MCG/ML-% IV SOLN
2.0000 ug/min | INTRAVENOUS | Status: DC
  Administered 2011-06-24: 10 ug/min via INTRAVENOUS

## 2011-06-24 MED ORDER — ACETAMINOPHEN 325 MG PO TABS: 650.0000 mg | ORAL_TABLET | ORAL | Status: DC | PRN

## 2011-06-24 MED ORDER — SODIUM CHLORIDE 0.9 % IJ SOLN: 3.0000 mL | INTRAMUSCULAR | Status: DC | PRN

## 2011-06-24 NOTE — Progress Notes (Signed)
Pt. C/o increasing midsternal pain with no relief from Nitroglycerin gtt. Dr. Riley Kill was notified, new orders received to obtain 12 lead ekg and morphine 2mg  q2hr prn. 12 lead ekg was obtained and results were paged to Dr. Riley Kill. Will continue to monitor.

## 2011-06-24 NOTE — Progress Notes (Signed)
Patient seen and examined. BP 128/91 P76.    Repeat cardiac enzymes pending.   No rub on exam ECG repeat does not show ST segment changes.  R in V3 greater than V2  (?wrong lead or lead position.  1 pm  (9 hours after symptom onset) enzymes are entirely normal.     CT Chest  No pulmonary emboli are identified.  There is no evidence of thoracic aortic aneurysm.  No pleural or pericardial effusions are identified.  The heart and great vessels are within normal limits.  There are no enlarged lymph nodes present.  There is no evidence of airspace disease, consolidation, suspicious  nodule/mass or endobronchial/endotracheal lesion.  Mild biapical pleuroparenchymal scarring is noted.  Calcification within the right adrenal gland likely represents  prior infection or inflammation. No other upper abdominal  abnormalities are noted.  No acute or suspicious bony abnormalities are present.  IMPRESSION:  No evidence of acute abnormality - no evidence of pulmonary emboli  or thoracic aortic aneurysm.  He looks comfortable now after MS.  BP and P Ok. Will continue to monitor. Plan cath tomorrow unless pos enzymes      Shawnie Pons 7:01 PM 06/24/2011

## 2011-06-24 NOTE — Progress Notes (Signed)
ANTICOAGULATION CONSULT NOTE - Initial Consult  Pharmacy Consult for heparin Indication: chest pain/rule out acs  Allergies  Allergen Reactions  . Benadryl (Altaryl)     "crazy"  . Ondansetron Other (See Comments)    Headaches  . Phenergan Nausea And Vomiting  . Propofol     "violent"    Patient Measurements: Height: 6\' 2"  (188 cm) Weight: 185 lb (83.915 kg) IBW/kg (Calculated) : 82.2  Heparin Dosing Weight:76  Vital Signs: BP: 132/91 mmHg (04/14 1215) Pulse Rate: 48  (04/14 1215)  Labs:  Basename 06/24/11 1225 06/24/11 1212  HGB 16.0 15.5  HCT 47.0 44.7  PLT -- 162  APTT -- --  LABPROT -- --  INR -- --  HEPARINUNFRC -- --  CREATININE 0.90 --  CKTOTAL -- --  CKMB -- --  TROPONINI -- --   Estimated Creatinine Clearance: 116.7 ml/min (by C-G formula based on Cr of 0.9).  Medical History: Past Medical History  Diagnosis Date  . Hypothyroidism   . 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 Myocardial infarction     x2  . S/P coronary angioplasty 2004    [facility]  . Degenerative joint disease of cervical spine   . GERD (gastroesophageal reflux disease)   . Coronary artery disease   . Basal cell carcinoma of skin   . Melanoma   . Tonsillar cancer     Squamous cell, on the left, stage IV   Assessment: 51 year old male admitted with chest pain, possible stemi. Dr. Riley Kill at bedside cancelled stemi d/t resolving EKG. 4000 units of heparin given per orders. No immediate plans for cath will continue with IV heparin drip.  Goal of Therapy:  Heparin level 0.3-0.7 units/ml   Plan:  Heparin drip at 1000 units/hr 6 hours heparin level then daily Daily cbc  Severiano Gilbert 06/24/2011,12:45 PM

## 2011-06-24 NOTE — Progress Notes (Signed)
ANTICOAGULATION CONSULT NOTE - Follow Up Consult  Pharmacy Consult for heparin  Indication: chest pain/rule out acs  Allergies  Allergen Reactions  . Benadryl (Altaryl)     "crazy"  . Ondansetron Other (See Comments)    Headaches  . Phenergan Nausea And Vomiting  . Propofol     "violent"    Patient Measurements: Height: 6\' 2"  (188 cm) Weight: 185 lb 10 oz (84.2 kg) IBW/kg (Calculated) : 82.2  Heparin Dosing Weight: 76kg  Vital Signs: Temp: 97.9 F (36.6 C) (04/14 2041) Temp src: Oral (04/14 2041) BP: 125/83 mmHg (04/14 2030) Pulse Rate: 76  (04/14 2030)  Labs:  Basename 06/24/11 1939 06/24/11 1300 06/24/11 1225 06/24/11 1212  HGB -- 15.4 16.0 --  HCT -- 43.5 47.0 44.7  PLT -- 151 -- 162  APTT -- 76* -- >200*  LABPROT -- 13.4 -- 13.5  INR -- 1.00 -- 1.01  HEPARINUNFRC 0.30 -- -- --  CREATININE -- 0.88 0.90 0.79  CKTOTAL -- 120 -- --  CKMB -- 2.6 -- --  TROPONINI -- <0.30 -- --   Estimated Creatinine Clearance: 119.4 ml/min (by C-G formula based on Cr of 0.88).   Medications:  Scheduled:    . alum & mag hydroxide-simeth      . aspirin  324 mg Oral Pre-Cath  . aspirin EC  81 mg Oral Daily  . heparin      . heparin  4,000 Units Intravenous Once  .  HYDROmorphone (DILAUDID) injection  1 mg Intravenous Once  . levothyroxine  100 mcg Oral QAC breakfast  . LORazepam  2 mg Oral QHS  . pantoprazole  40 mg Oral Daily  . DISCONTD: aspirin  324 mg Oral Once  . DISCONTD: aspirin  324 mg Oral NOW  . DISCONTD: aspirin  300 mg Rectal NOW    Assessment: 51 yo male here with CP on heparin (inital level at goal) and noted for cath in am.  Goal of Therapy:  Heparin level 0.3-0.7 units/ml   Plan:  -Continue heparin at current rate -Will recheck a level in am  Benny Lennert 06/24/2011,8:58 PM

## 2011-06-24 NOTE — ED Notes (Addendum)
EMS-midsternal heaviness around 3am, 324asa, 4mg  of morphine, 4nitros en route, pain from 10/10 to 8/10. 18g(L)AC. Initial blood pressure 190/120.

## 2011-06-24 NOTE — ED Notes (Addendum)
Dr Riley Kill at bedside, new ekg shot, pt on zoll pads, pt given 4000units of heparin per orders, will not take pt to cath lab at this time due to normalization of ekg. Pt is diaphoretic rating pain 9/10 to center of chest.

## 2011-06-24 NOTE — Progress Notes (Signed)
06/24/11 1227  Discharge Planning  Type of Residence Private residence  Living Arrangements Alone  Home Care Services No  Support Systems Other relatives;Parent  Do you have any problems obtaining your medications? No  Once you are discharged, how will you get to your follow-up appointment? Self;Family  Expected Discharge Date 06/27/11  Social Work Consult Needed No    Dionne Milo MSW [facility] Emergency Dept. Weekend/Social Worker [phone]

## 2011-06-24 NOTE — Progress Notes (Signed)
I called the nursing staff in the CCU to let them know that cardiac enzymes have not been reported out.  I was told they were drawn, and they are in the EPIC orders for draw earlier.  They will make the apporpriate inquiries regarding the status of the patient's labs.  At checkout, the nursing staff were also told they had been drawn.    Shawnie Pons 11:19 PM 06/24/2011

## 2011-06-24 NOTE — ED Provider Notes (Signed)
History     CSN: [id number]  Arrival date & time 06/24/11  1204   None     Chief Complaint  Patient presents with  . Chest Pain    (Consider location/radiation/quality/duration/timing/severity/associated sxs/prior treatment) HPI Comments: The patient is a 51 year old male with a reported past medical history significant for GERD, esophageal stricture, coronary artery disease, reportedly a history of myocardial infarction status post coronary angioplasty without stenting, as well as stage IV tonsillar cancer that he states is currently in remission after radiation and chemotherapy. He reports that at approximately 3:00 AM he had sudden onset of left-sided chest pain described as heavy and pressure, severe in intensity, radiating to his left upper extremity and left jaw, associated with diaphoresis, shortness of breath, nausea. The pain has improved during the time he has been EMS after having been given multiple nitroglycerin sublingual tablets, but currently is still at a 5/10 in intensity. He states that the pain feels like his prior heart attack. The patient was brought in by EMS emergently as a code stenting. Looking at the EMS 12-lead strips, there is question of ST elevation in leads V4, V5, and V6 which at the time of our repeat 12-lead ECG has resolved. The patient however, has a concerning appearance, with significant diaphoresis at rest, appearing uncomfortable, but mildly lethargic(do to the morphine given by EMS?) But is responsive verbally to questions and answers them appropriately, demonstrating that he is oriented to person, place, time, and event appropriately.  A level V caveat applies this patient encounter do to urgent need for intervention on a clinically unstable patient.  The history is provided by the patient and the EMS personnel.    Past Medical History  Diagnosis Date  . Hypothyroidism   . 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 Myocardial infarction     x2  . S/P coronary angioplasty 2004    [facility]  . Degenerative joint disease of cervical spine   . GERD (gastroesophageal reflux disease)   . Coronary artery disease   . Basal cell carcinoma of skin   . Melanoma   . Tonsillar cancer     Squamous cell, on the left, stage IV    Past Surgical History  Procedure Date  . Tonsillectomy 2009    left Dr. Gordy Levan [facility]  . Posterior fusion cervical spine September 2012    C5-6  . Pharyngectomy 2009    left Dr. Gordy Levan  . Partial glossectomy     left Dr. Gordy Levan  . Neck dissection     left Dr. Gordy Levan  . Multiple tooth extractions 2009  . Skin cancer excision     Multiple squamous and basal cell carcinomas  . Melanoma excision     Right forearm  . Cervical discectomy 2010    C 5-6  . Esophagogastroduodenoscopy 03/26/2011    Procedure: ESOPHAGOGASTRODUODENOSCOPY (EGD);  Surgeon: Iva Boop, MD;  Location: [facility];  Service: Endoscopy;  Laterality: N/A;  . Balloon dilation 03/26/2011    Procedure: BALLOON DILATION;  Surgeon: Iva Boop, MD;  Location: [facility];  Service: Endoscopy;  Laterality: N/A;  . Colonoscopy 03/26/2011    Procedure: COLONOSCOPY;  Surgeon: Iva Boop, MD;  Location: [facility];  Service: Endoscopy;  Laterality: N/A;    Family History  Problem Relation Age of Onset  . Colon cancer Maternal Uncle   .  Heart disease Maternal Grandmother   . Cirrhosis Maternal Grandfather     alcoholic  . Cirrhosis Maternal Uncle     alcoholic  . Hypothyroidism Mother   . Goiter Mother     History  Substance Use Topics  . Smoking status: Former Smoker -- 1.5 packs/day for 20 years    Types: Cigarettes    Quit date: 08/11/2007  . Smokeless tobacco: Never Used  . Alcohol Use: No      Review of Systems  Constitutional: Positive for diaphoresis and fatigue. Negative for fever, chills, activity change and appetite change.    HENT: Negative for ear pain, congestion, sore throat, rhinorrhea, neck pain, postnasal drip and sinus pressure.   Eyes: Negative for visual disturbance.  Respiratory: Positive for chest tightness. Negative for cough, shortness of breath and wheezing.   Cardiovascular: Positive for chest pain. Negative for palpitations and leg swelling.  Gastrointestinal: Positive for nausea. Negative for vomiting, abdominal pain and diarrhea.  Genitourinary: Negative.   Musculoskeletal: Negative for myalgias and back pain.  Skin: Negative for color change, pallor and rash.  Neurological: Positive for light-headedness. Negative for dizziness, tremors, syncope, weakness, numbness and headaches.  Hematological: Does not bruise/bleed easily.  Psychiatric/Behavioral: Negative for confusion.    Allergies  Benadryl; Ondansetron; Phenergan; and Propofol  Home Medications   Current Outpatient Rx  Name Route Sig Dispense Refill  . AMBULATORY NON FORMULARY MEDICATION  Medication Name: Merinol Take as needed     . ESOMEPRAZOLE MAGNESIUM 40 MG PO CPDR Oral Take 40 mg by mouth daily before breakfast.     . HYDROCODONE-ACETAMINOPHEN 10-325 MG PO TABS Oral Take 1 tablet by mouth every 6 (six) hours as needed.      Marland Kitchen LEVOTHYROXINE SODIUM 100 MCG PO TABS Oral Take 100 mcg by mouth daily.      Marland Kitchen LORAZEPAM 1 MG PO TABS  Take 2 tablets at bedtime     . SODIUM FLUORIDE 1.1 % DT CREA  Apply a thin ribbon of toothpaste to toothbrush at bedtime. Brush for 2 minutes. Spit out excess. Do not swallow.  Repeat nightly. 1 Tube 11    BP 125/91  Resp 17  SpO2 99%  Physical Exam  Nursing note and vitals reviewed. Constitutional: He is oriented to person, place, and time. He appears distressed.       The patient is in moderate to severe respiratory distress on arrival  HENT:  Head: Normocephalic and atraumatic.  Mouth/Throat: Oropharynx is clear and moist.  Eyes: Conjunctivae and EOM are normal. Pupils are equal, round, and  reactive to light. No scleral icterus.  Neck: Normal range of motion. Neck supple. No JVD present. No tracheal deviation present.  Cardiovascular: Regular rhythm, S1 normal, S2 normal, intact distal pulses and normal pulses.  Bradycardia present.  Exam reveals no gallop and no friction rub.   No murmur heard. Pulmonary/Chest: No accessory muscle usage or stridor. Not tachypneic. No respiratory distress. He has no decreased breath sounds. He has no wheezes. He has no rhonchi. He has no rales. He exhibits no tenderness.  Abdominal: Soft. He exhibits no distension, no abdominal bruit, no pulsatile midline mass and no mass. There is no tenderness. There is no rebound and no guarding.  Musculoskeletal: Normal range of motion. He exhibits no edema and no tenderness.  Neurological: He is oriented to person, place, and time. No cranial nerve deficit. He exhibits normal muscle tone.       lethargy  Skin: Skin is warm. No  rash noted. He is not diaphoretic. No erythema. No pallor.       diaphoretic  Psychiatric: He has a normal mood and affect. His behavior is normal. Thought content normal.    ED Course  Procedures (including critical care time)   Date: 06/24/2011  Rate: 50  Rhythm: sinus bradycardia  QRS Axis: normal  Intervals: normal  ST/T Wave abnormalities: early repolarization  Conduction Disutrbances:none  Narrative Interpretation: No apparent stemi on ECG  Old EKG Reviewed: unchanged    Labs Reviewed  APTT  CBC  COMPREHENSIVE METABOLIC PANEL  PROTIME-INR   No results found.   1. Unstable angina   2. Chest pain       MDM  Acute STEMI, NSTEMI, Unstable Angina, Acute Coronary Syndrome, pulmonary embolism, esophageal spasm, esophageal rupture, pneumonia, pneumothorax, arrhythmia, electrolyte abnormality, anemia, Musculoskeletal chest pain, costochondritis, GERD, Gastrointestinal Chest Pain, Pleuritic Chest Pain all considered amongst other potential etiologies of the patient's  symptoms.   The patient is feeling better with heparin and nitroglycerin.  He will be admitted as unstable angina to the cardiac service/CCU for further management.  CRITICAL CARE Performed by: Felisa Bonier   Total critical care time: 60 minutes  Critical care time was exclusive of separately billable procedures and treating other patients.  Critical care was necessary to treat or prevent imminent or life-threatening deterioration.  Critical care was time spent personally by me on the following activities: development of treatment plan with patient and/or surrogate as well as nursing, discussions with consultants, evaluation of patient's response to treatment, examination of patient, obtaining history from patient or surrogate, ordering and performing treatments and interventions, ordering and review of laboratory studies, ordering and review of radiographic studies, pulse oximetry and re-evaluation of patient's condition.         Felisa Bonier, MD 06/24/11 [phone]

## 2011-06-24 NOTE — H&P (Signed)
CC:  Chest pain  HPI:  Patient was called in as a code STEMI, although a diagnostic ECG was never observed by me.  Had onset of chest pain at about 3:30 AM in the morning. The pain persisted and radiated to the left arm. He was diaphoretic. He called EMS, and electrocardiogram performed in the field and sent by [facility] did not demonstrate ST segment elevation. He arrived in the emergency room, and repeat electrocardiogram also does not demonstrate ST elevation. Despite nine hours of chest pain, initial enzymes are normal. He is feeling better after the administration of nitroglycerin and heparin. His history is important for prior treatment for head and neck tumor and included [facility], and also chemotherapy and radiation. He says that he has been recently seen by Dr. Dayton Scrape, and that he is in remission. Of note, the patient claims to have prior heart attacks. He's been seen both in [facility], and [place], and despite prior catheterization has no history of stent or angioplasty. His mother tells me that there is some confusion as to whether he had or did not have a heart attack. He has not smoked since 2009. He does not have hypertension or diabetes. He currently is appearing slightly more comfortable.    Allergies  Allergen Reactions  . Benadryl (Altaryl)     "crazy"  . Ondansetron Other (See Comments)    Headaches  . Phenergan Nausea And Vomiting  . Propofol     "violent"    Past Medical History  Diagnosis Date  . Hypothyroidism   . 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 Myocardial infarction     x2  . S/P coronary angioplasty 2004    [facility]  . Degenerative joint disease of cervical spine   . GERD (gastroesophageal reflux disease)   . Coronary artery disease   . Basal cell carcinoma of skin   . Melanoma   . Tonsillar cancer     Squamous cell, on the left,  stage IV    Past Surgical History  Procedure Date  . Tonsillectomy 2009    left Dr. Gordy Levan [facility]  . Posterior fusion cervical spine September 2012    C5-6  . Pharyngectomy 2009    left Dr. Gordy Levan  . Partial glossectomy     left Dr. Gordy Levan  . Neck dissection     left Dr. Gordy Levan  . Multiple tooth extractions 2009  . Skin cancer excision     Multiple squamous and basal cell carcinomas  . Melanoma excision     Right forearm  . Cervical discectomy 2010    C 5-6  . Esophagogastroduodenoscopy 03/26/2011    Procedure: ESOPHAGOGASTRODUODENOSCOPY (EGD);  Surgeon: Iva Boop, MD;  Location: [facility];  Service: Endoscopy;  Laterality: N/A;  . Balloon dilation 03/26/2011    Procedure: BALLOON DILATION;  Surgeon: Iva Boop, MD;  Location: [facility];  Service: Endoscopy;  Laterality: N/A;  . Colonoscopy 03/26/2011    Procedure: COLONOSCOPY;  Surgeon: Iva Boop, MD;  Location: [facility];  Service: Endoscopy;  Laterality: N/A;    Family History  Problem Relation Age of Onset  . Colon cancer Maternal Uncle   . Heart disease Maternal Grandmother   . Cirrhosis Maternal Grandfather     alcoholic  . Cirrhosis Maternal Uncle     alcoholic  . Hypothyroidism Mother   .  Goiter Mother     History   Social History  . Marital Status: Divorced    Spouse Name: N/A    Number of Children: N/A  . Years of Education: N/A   Occupational History  . Disabled    Social History Main Topics  . Smoking status: Former Smoker -- 1.5 packs/day for 20 years    Types: Cigarettes    Quit date: 08/11/2007  . Smokeless tobacco: Never Used  . Alcohol Use: No  . Drug Use: No  . Sexually Active: Not on file   Other Topics Concern  . Not on file   Social History Narrative   Former Nutritional therapist, he is disabled    ROS: Please see the HPI.  All other systems reviewed and negative.  No diarrhea, no blood in stools, not coughing up blood.    PHYSICAL EXAM:  BP 132/91  Pulse 48  Resp  15  Ht 6\' 2"  (1.88 m)  Wt 185 lb (83.915 kg)  BMI 23.75 kg/m2  SpO2 100%  General: Thin gentleman with slightly slurred speech.   in no acute distress. Head:  Normocephalic and atraumatic. Neck: no JVD.  Scars from prior surgery.   Lungs: Clear to auscultation and percussion. Heart: Normal S1 and S2.  No murmur, rubs or gallops.  Abdomen:  Normal bowel sounds; soft; non tender; no organomegaly Pulses: Pulses normal in all 4 extremities. Extremities: No clubbing or cyanosis. No edema. Neurologic: Alert and oriented x 3.  EKG:  NSR.  No acute changes.  Rate 50  (on no beta blocker)  ASSESSMENT AND PLAN:  1.  Chest pain  -- uncertain etiology.  With neg enzymes after nine hours, will need CT of chest and serial enzymes.  Sees Leone Payor and will get GI consult.  Prior endo in January was negative, but could not exclude something esophageal.  He was quite diaphoretic, but enzymes should be positive or some ECG change by this point in time.   Spoke with ED staff and we will get a CT of chest intially.  I spoke with Dr. Russella Dar who will see him as well. He may eventually require cardiac cath.    2.  Stage 4 CA of tonsil  -- sp treatment in remission 3.  GERD 4.  ? Prior MI   Plan  See above.  Admit.  Rule out.  Serial enzymes. GI called.

## 2011-06-25 ENCOUNTER — Encounter (HOSPITAL_COMMUNITY)
Admission: EM | Disposition: A | Discharge status: Home / Self Care | Payer: Self-pay | Source: Home / Self Care | Attending: Cardiology

## 2011-06-25 ENCOUNTER — Encounter (HOSPITAL_COMMUNITY): Payer: Self-pay | Admitting: Cardiology

## 2011-06-25 DIAGNOSIS — I214 Non-ST elevation (NSTEMI) myocardial infarction: Secondary | ICD-10-CM | POA: Diagnosis present

## 2011-06-25 DIAGNOSIS — I2510 Atherosclerotic heart disease of native coronary artery without angina pectoris: Secondary | ICD-10-CM

## 2011-06-25 HISTORY — PX: LEFT HEART CATHETERIZATION WITH CORONARY ANGIOGRAM: SHX5451

## 2011-06-25 HISTORY — PX: PERCUTANEOUS CORONARY INTERVENTION-BALLOON ONLY: SHX6014

## 2011-06-25 LAB — CBC
HCT: 40.8 % (ref 39.0–52.0)
Hemoglobin: 14 g/dL (ref 13.0–17.0)
MCH: 31.4 pg (ref 26.0–34.0)
MCH: 31.4 pg (ref 26.0–34.0)
MCHC: 34.6 g/dL (ref 30.0–36.0)
MCHC: 34.3 g/dL (ref 30.0–36.0)
Platelets: 128 10*3/uL — ABNORMAL LOW (ref 150–400)
RDW: 13.8 % (ref 11.5–15.5)
RDW: 13.9 % (ref 11.5–15.5)

## 2011-06-25 LAB — BASIC METABOLIC PANEL
Calcium: 9 mg/dL (ref 8.4–10.5)
GFR calc Af Amer: >90 mL/min (ref >90)
GFR calc non Af Amer: >90 mL/min (ref >90)
Potassium: 3.8 mEq/L (ref 3.5–5.1)
Sodium: 137 mEq/L (ref 135–145)

## 2011-06-25 LAB — CARDIAC PANEL(CRET KIN+CKTOT+MB+TROPI)
Relative Index: INVALID (ref 0.0–2.5)
Total CK: 99 U/L (ref 7–232)
Troponin I: 1.78 ng/mL (ref <0.30)
Troponin I: 0.99 ng/mL (ref <0.30)

## 2011-06-25 LAB — POCT ACTIVATED CLOTTING TIME
Activated Clotting Time: 177 seconds
Activated Clotting Time: 270 seconds
Activated Clotting Time: 138 seconds

## 2011-06-25 LAB — HEPARIN LEVEL (UNFRACTIONATED): Heparin Unfractionated: 0.3 IU/mL (ref 0.30–0.70)

## 2011-06-25 LAB — LIPID PANEL
LDL Cholesterol: 112 mg/dL — ABNORMAL HIGH (ref 0–99)
Triglycerides: 86 mg/dL (ref <150)
VLDL: 17 mg/dL (ref 0–40)

## 2011-06-25 LAB — PROTIME-INR: INR: 0.97 (ref 0.00–1.49)

## 2011-06-25 SURGERY — LEFT HEART CATHETERIZATION WITH CORONARY ANGIOGRAM
Anesthesia: LOCAL

## 2011-06-25 MED ORDER — LIDOCAINE HCL (PF) 1 % IJ SOLN
INTRAMUSCULAR | Status: AC
  Filled 2011-06-25: qty 30

## 2011-06-25 MED ORDER — CLOPIDOGREL BISULFATE 300 MG PO TABS
ORAL_TABLET | ORAL | Status: AC
  Filled 2011-06-25: qty 2

## 2011-06-25 MED ORDER — LORAZEPAM 0.5 MG PO TABS
2.0000 mg | ORAL_TABLET | Freq: Every day | ORAL | Status: DC
  Administered 2011-06-25 – 2011-06-26 (×2): 2 mg via ORAL
  Filled 2011-06-25: qty 4
  Filled 2011-06-25: qty 2

## 2011-06-25 MED ORDER — NITROGLYCERIN 0.2 MG/ML ON CALL CATH LAB
INTRAVENOUS | Status: AC
  Filled 2011-06-25: qty 1

## 2011-06-25 MED ORDER — ACETAMINOPHEN 325 MG PO TABS: 650.0000 mg | ORAL_TABLET | ORAL | Status: DC | PRN

## 2011-06-25 MED ORDER — EPTIFIBATIDE BOLUS VIA INFUSION
180.0000 ug/kg | Freq: Once | INTRAVENOUS | Status: AC
  Administered 2011-06-25: 15200 ug via INTRAVENOUS
  Filled 2011-06-25: qty 21

## 2011-06-25 MED ORDER — MORPHINE SULFATE 2 MG/ML IJ SOLN
2.0000 mg | INTRAMUSCULAR | Status: DC | PRN
  Administered 2011-06-25 (×2): 2 mg via INTRAVENOUS
  Filled 2011-06-25 (×2): qty 1

## 2011-06-25 MED ORDER — FENTANYL CITRATE 0.05 MG/ML IJ SOLN
INTRAMUSCULAR | Status: AC
  Filled 2011-06-25: qty 2

## 2011-06-25 MED ORDER — EPTIFIBATIDE 75 MG/100ML IV SOLN
2.0000 ug/kg/min | INTRAVENOUS | Status: DC
  Administered 2011-06-25 (×2): 2 ug/kg/min via INTRAVENOUS
  Filled 2011-06-25 (×4): qty 100

## 2011-06-25 MED ORDER — CLOPIDOGREL BISULFATE 75 MG PO TABS
75.0000 mg | ORAL_TABLET | Freq: Every day | ORAL | Status: DC
  Administered 2011-06-26 – 2011-06-27 (×2): 75 mg via ORAL
  Filled 2011-06-25 (×2): qty 1

## 2011-06-25 MED ORDER — HEPARIN (PORCINE) IN NACL 2-0.9 UNIT/ML-% IJ SOLN
INTRAMUSCULAR | Status: AC
  Filled 2011-06-25: qty 2000

## 2011-06-25 MED ORDER — MIDAZOLAM HCL 2 MG/2ML IJ SOLN
INTRAMUSCULAR | Status: AC
  Filled 2011-06-25: qty 2

## 2011-06-25 MED ORDER — ASPIRIN 81 MG PO CHEW
81.0000 mg | CHEWABLE_TABLET | Freq: Every day | ORAL | Status: DC
  Administered 2011-06-26 – 2011-06-27 (×2): 81 mg via ORAL
  Filled 2011-06-25 (×2): qty 1

## 2011-06-25 MED ORDER — LEVOTHYROXINE SODIUM 100 MCG PO TABS
100.0000 ug | ORAL_TABLET | Freq: Every day | ORAL | Status: DC
  Administered 2011-06-26 – 2011-06-27 (×2): 100 ug via ORAL
  Filled 2011-06-25 (×3): qty 1

## 2011-06-25 MED ORDER — EPTIFIBATIDE 75 MG/100ML IV SOLN
2.0000 ug/kg/min | INTRAVENOUS | Status: AC
  Administered 2011-06-25: 2 ug/kg/min via INTRAVENOUS
  Filled 2011-06-25: qty 100

## 2011-06-25 MED ORDER — HYDROCODONE-ACETAMINOPHEN 10-325 MG PO TABS
1.0000 | ORAL_TABLET | Freq: Four times a day (QID) | ORAL | Status: DC | PRN
  Administered 2011-06-25 – 2011-06-26 (×5): 1 via ORAL
  Filled 2011-06-25 (×5): qty 1

## 2011-06-25 MED ORDER — ONDANSETRON HCL 4 MG/2ML IJ SOLN: 4.0000 mg | Freq: Four times a day (QID) | INTRAMUSCULAR | Status: DC | PRN

## 2011-06-25 MED ORDER — DIAZEPAM 2 MG PO TABS
2.0000 mg | ORAL_TABLET | Freq: Four times a day (QID) | ORAL | Status: DC | PRN
  Administered 2011-06-27: 2 mg via ORAL
  Filled 2011-06-25: qty 1

## 2011-06-25 MED ORDER — BIVALIRUDIN 250 MG IV SOLR
INTRAVENOUS | Status: AC
  Filled 2011-06-25: qty 250

## 2011-06-25 MED ORDER — SODIUM CHLORIDE 0.9 % IV SOLN
INTRAVENOUS | Status: AC
  Administered 2011-06-25: 150 mL/h via INTRAVENOUS

## 2011-06-25 NOTE — Progress Notes (Signed)
CRITICAL VALUE ALERT  Critical value received:  Troponin 1.78  Date of notification:  06/25/11  Time of notification:  0116  Critical value read back:yes  Nurse who received alert: Jarvis Newcomer RN  MD notified (1st page):  0130  Time of first page:  0130  Responding MD: Dr. Donnie Aho  Time MD responded:  0134  No new orders received.

## 2011-06-25 NOTE — Progress Notes (Signed)
Patient Name: Bobby Floyd Date of Encounter: 06/25/2011     SUBJECTIVE:C/O chest pain, better with narcotics but does not tolerate increased doses of NTG because of HA.  OBJECTIVE Filed Vitals:   06/25/11 0400 06/25/11 0500 06/25/11 0600 06/25/11 0800  BP: 119/64 108/74  124/76  Pulse: 64 66 73 71  Temp: 97.9 F (36.6 C)   97.9 F (36.6 C)  TempSrc: Oral   Oral  Resp: 12 8 17 14   Height:      Weight:  187 lb 9.8 oz (85.1 kg)    SpO2: 98% 98% 99% 98%    Intake/Output Summary (Last 24 hours) at 06/25/11 0845 Last data filed at 06/25/11 0800  Gross per 24 hour  Intake 1151.5 ml  Output    700 ml  Net  451.5 ml   Weight change:  Wt Readings from Last 3 Encounters:  06/25/11 187 lb 9.8 oz (85.1 kg)  06/25/11 187 lb 9.8 oz (85.1 kg)  06/25/11 187 lb 9.8 oz (85.1 kg)     PHYSICAL EXAM  General: Well developed, well nourished, male in no acute distress. Head: Normocephalic, atraumatic.  Neck: Supple without bruits, JVD not elevated. Lungs:  Resp regular and unlabored, some rales, slight wheeze. Heart: RRR, S1, S2, no S3, S4, or murmurs. Abdomen: Soft, non-tender, non-distended, BS + x 4.  Extremities: No clubbing, cyanosis, no edema.  Neuro: Alert and oriented X 3. Moves all extremities spontaneously. Psych: Normal affect.  LABS:  CBC: Basename 06/25/11 0555 06/24/11 1300  WBC 6.3 10.0  NEUTROABS -- 8.6*  HGB 14.2 15.4  HCT 41.0 43.5  MCV 90.7 90.2  PLT 128* 151   INR: Basename 06/25/11 0555  INR 0.97   Basic Metabolic Panel: Basename 06/25/11 0555 06/24/11 1300  NA 137 137  K 3.8 3.8  CL 106 105  CO2 23 23  GLUCOSE 100* 122*  BUN 11 11  CREATININE 0.92 0.88  CALCIUM 9.0 9.0  MG -- 2.1  PHOS -- --   Liver Function Tests: Basename 06/24/11 1300 06/24/11 1212  AST 21 19  ALT 11 11  ALKPHOS 85 85  BILITOT 0.6 0.7  PROT 6.4 6.4  ALBUMIN 4.0 4.1   Cardiac Enzymes: Basename 06/25/11 0555 06/25/11 06/24/11 1300  CKTOTAL 99 124 120    CKMB 4.6* 5.8* 2.6  CKMBINDEX -- -- --  TROPONINI 0.99* 1.78* <0.30    Basename 06/24/11 1223  TROPIPOC 0.02   BNP: Pro B Natriuretic peptide (BNP)  Date/Time Value Range Status  06/24/2011  1:00 PM 47.8  0-125 (pg/mL) Final   Hemoglobin A1C: Basename 06/24/11 1300  HGBA1C 5.3   Fasting Lipid Panel: Basename 06/25/11 0555  CHOL 196  HDL 67  LDLCALC 112*  TRIG 86  CHOLHDL 2.9  LDLDIRECT --   Thyroid Function Tests: Basename 06/24/11 1300  TSH 2.097  T4TOTAL --  T3FREE --  THYROIDAB --    TELE:    SR     Radiology/Studies: Ct Angio Chest W/cm &/or Wo Cm  06/24/2011  *RADIOLOGY REPORT*  Clinical Data: 51 year old male with chest pain and history of melanoma and tonsillar cancer.  CT ANGIOGRAPHY CHEST  Technique:  Multidetector CT imaging of the chest using the standard protocol during bolus administration of intravenous contrast. Multiplanar reconstructed images including MIPs were obtained and reviewed to evaluate the vascular anatomy.  Contrast: OMNIPAQUE IOHEXOL 350 MG/ML SOLN  Comparison: 06/24/2011 chest radiograph and 03/16/2008 PET-CT  Findings: This is a technically satisfactory study.  No pulmonary emboli are identified. There is no evidence of thoracic aortic aneurysm. No pleural or pericardial effusions are identified. The heart and great vessels are within normal limits. There are no enlarged lymph nodes present.  There is no evidence of airspace disease, consolidation, suspicious nodule/mass or endobronchial/endotracheal lesion. Mild biapical pleuroparenchymal scarring is noted.  Calcification within the right adrenal gland likely represents prior infection or inflammation.  No other upper abdominal abnormalities are noted. No acute or suspicious bony abnormalities are present.  IMPRESSION: No evidence of acute abnormality - no evidence of pulmonary emboli or thoracic aortic aneurysm.  Original Report Authenticated By: Rosendo Gros, M.D.   Dg Chest Port 1  View  06/24/2011  *RADIOLOGY REPORT*  Clinical Data: Chest pain.  PORTABLE CHEST - 1 VIEW  Comparison: 02/24/2010.  Findings: The heart size is normal.  The lungs are clear.  The visualized soft tissues and bony thorax unremarkable.  IMPRESSION: Negative chest.  Original Report Authenticated By: Jamesetta Orleans. MATTERN, M.D.    Current Medications:     . alum & mag hydroxide-simeth      . aspirin  324 mg Oral Pre-Cath  . aspirin EC  81 mg Oral Daily  . eptifibatide  180 mcg/kg Intravenous Once  . heparin      . heparin  4,000 Units Intravenous Once  .  HYDROmorphone (DILAUDID) injection  1 mg Intravenous Once  . levothyroxine  100 mcg Oral QAC breakfast  . LORazepam  2 mg Oral QHS  . pantoprazole  40 mg Oral Daily  . DISCONTD: aspirin  324 mg Oral Once  . DISCONTD: aspirin  324 mg Oral NOW  . DISCONTD: aspirin  300 mg Rectal NOW    ASSESSMENT AND PLAN: NSTEMI - Hx CAD, cath today with further evaluation and treatment depending on the results.  Signed, Theodore Demark , PA-C 8:45 AM 06/25/2011]  Patient seen multiple times yesterday and evaluated.  For cath study today.  The patient is agreeable.

## 2011-06-25 NOTE — H&P (View-Only) (Signed)
Findings noted.  I have been in the cath lab with a STEMI. Will start eptifibatide drip for non STEMI, and as previously planned, with plan cath study in am for definition of coronary anatomy.    Stacy Sailer 2:39 AM 06/25/2011  

## 2011-06-25 NOTE — Progress Notes (Signed)
Findings noted.  I have been in the cath lab with a STEMI. Will start eptifibatide drip for non STEMI, and as previously planned, with plan cath study in am for definition of coronary anatomy.    Shawnie Pons 2:39 AM 06/25/2011

## 2011-06-25 NOTE — Progress Notes (Signed)
51yo male with elevated troponin and plan for cath today to begin Integrilin for NSTEMI.  Will give bolus of 180 mcg/kg followed by gtt at 1 mcg/kg/min and monitor CBC.  Vernard Gambles, PharmD, BCPS  06/25/2011 2:47 AM

## 2011-06-25 NOTE — Interval H&P Note (Signed)
History and Physical Interval Note:  06/25/2011 10:00 AM  Bobby Floyd  has presented today for surgery, with the diagnosis of non STEMI.    The various methods of treatment have been discussed with the patient and family. After consideration of risks, benefits and other options for treatment, the patient has consented to  Procedure(s) (LRB): LEFT HEART CATHETERIZATION WITH CORONARY ANGIOGRAM (N/A) as a surgical intervention .  The patients' history has been reviewed, patient examined, no change in status, stable for surgery.  I have reviewed the patients' chart and labs.  Questions were answered to the patient's satisfaction.   Patient seen and evaluated.  Recurrent chest pain at present.  Enzymes are borderline.  We now have cath information from [facility] in 2004.  Cath is indicated for definition of anatomy.  The patient is agreeable to proceed.       Shawnie Pons

## 2011-06-25 NOTE — Progress Notes (Signed)
Order for sheath removal verified per post procedural orders. Procedure explained to patient and Rt femoral artery access site assessed: level 0, palpable dorsalis pedis and posterior tibial pulses. 6French Sheath removed and manual pressure applied for 20 minutes. Pre, peri, & post procedural vitals: HR 75, RR 15, O2 Sat upper 90s, BP 130s/80s, Pain 0. Distal pulses remained intact after sheath removal. Access site level 0 and dressed with 4X4 gauze and tegaderm.  Kenney Houseman, RN confirmed condition of site. Post procedural instructions discussed with return demonstration from patient.

## 2011-06-25 NOTE — Progress Notes (Signed)
INITIAL ADULT NUTRITION ASSESSMENT Date: 06/25/2011   Time: 2:56 PM Reason for Assessment: Nutrition Risk  ASSESSMENT: Male 51 y.o.  Dx: Non-ST elevation myocardial infarction (NSTEMI), initial care episode  Hx:  Past Medical History  Diagnosis Date  . Hypothyroidism   . 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 Myocardial infarction     x2  . S/P coronary angioplasty 2004    [facility]  . Degenerative joint disease of cervical spine   . GERD (gastroesophageal reflux disease)   . Coronary artery disease   . Basal cell carcinoma of skin   . Melanoma   . Tonsillar cancer     Squamous cell, on the left, stage IV    Related Meds:     . aspirin  324 mg Oral Pre-Cath  . aspirin  81 mg Oral Daily  . clopidogrel      . clopidogrel  75 mg Oral Q breakfast  . eptifibatide  180 mcg/kg Intravenous Once  . fentaNYL      . heparin      . heparin      . levothyroxine  100 mcg Oral Daily  . lidocaine      . LORazepam  2 mg Oral QHS  . midazolam      . nitroGLYCERIN      . pantoprazole  40 mg Oral Daily  . DISCONTD: aspirin  324 mg Oral NOW  . DISCONTD: aspirin EC  81 mg Oral Daily  . DISCONTD: aspirin  300 mg Rectal NOW  . DISCONTD: levothyroxine  100 mcg Oral QAC breakfast  . DISCONTD: LORazepam  2 mg Oral QHS     Ht: 6\' 2"  (188 cm)  Wt: 187 lb 9.8 oz (85.1 kg)  Ideal Wt: 86.4 kg  % Ideal Wt: 08%  Usual Wt: 195-200 lbs (88.6 kg) per pt report-prior to CA 03/15/11  177 lbs  % Usual Wt: 96%  Body mass index is 24.09 kg/(m^2). 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 Some recent weight loss, unknown reason "6-7 lbs maybe."  Labs:  CMP     Component Value Date/Time   NA 137 06/25/2011 0555   K 3.8 06/25/2011 0555   CL 106 06/25/2011 0555   CO2 23 06/25/2011 0555   GLUCOSE 100* 06/25/2011 0555   BUN 11 06/25/2011 0555   CREATININE 0.92  06/25/2011 0555   CALCIUM 9.0 06/25/2011 0555   PROT 6.4 06/24/2011 1300   ALBUMIN 4.0 06/24/2011 1300   AST 21 06/24/2011 1300   ALT 11 06/24/2011 1300   ALKPHOS 85 06/24/2011 1300   BILITOT 0.6 06/24/2011 1300   GFRNONAA >90 06/25/2011 0555   GFRAA >90 06/25/2011 0555    Intake/Output Summary (Last 24 hours) at 06/25/11 1459 Last data filed at 06/25/11 1400  Gross per 24 hour  Intake   1261 ml  Output   1775 ml  Net   -514 ml     Diet Order:    Supplements/Tube Feeding: none  IVF:    sodium chloride Last Rate: 150 mL/hr at 06/25/11 1400  eptifibatide Last Rate: 2 mcg/kg/min (06/25/11 1345)  DISCONTD: sodium chloride Last Rate: 50 mL/hr at 06/25/11 0900  DISCONTD: eptifibatide Last Rate: 2 mcg/kg/min (06/25/11 0749)  DISCONTD: heparin Last Rate: Stopped (06/25/11 0916)  DISCONTD: nitroGLYCERIN Last Rate: Stopped (06/25/11 0941)  Estimated Nutritional Needs:   Kcal: 2200-2400 Protein: 90-100 gm Fluid:  2.2-2.4 L  Pt with hx of tonsil CA, s/p treatment. While in treatment had lost weight reports lowest weight to have been 118 lbs. Hx of tube feedings per pt report. Now has difficulty with swallowing dry foods. Requests lots of liquids on tray. States he is very hungry right now, has not eaten in 2 days. Remains NPO, s/p cath.  Pt reports some weight loss, weight hx shows weight gain since last office visit.   NUTRITION DIAGNOSIS: -Swallowing difficulty (NI-1.1).  Status: Ongoing  RELATED TO: dry mouth  AS EVIDENCE BY: request for extra liquids with meals  MONITORING/EVALUATION(Goals): Goal: PO intake to maintain weight Monitor: PO intake, weight, labs, I/O's  EDUCATION NEEDS: -No education needs identified at this time  INTERVENTION: 1. RD will make note in health touch for extra liquids to be provided when pt diets advances 2. RD will continue to follow  Dietitian [phone]  DOCUMENTATION CODES Per approved criteria  -Not Applicable    Clarene Duke  MARIE 06/25/2011, 2:56 PM

## 2011-06-25 NOTE — Plan of Care (Signed)
Problem: Consults Goal: Cardiac Cath Patient Education (See Patient Education module for education specifics.) Outcome: Progressing Discussed with patient cardiac cath procedure and advised patient to watch the video. At current time , patient request to wait until the morning to watch the film.

## 2011-06-25 NOTE — CV Procedure (Signed)
  Cardiac Catheterization Procedure Note  Name: TREVONTE ASHKAR MRN: [id number] DOB: October 29, 1960  Procedure: Left Heart Cath, Selective Coronary Angiography, LV angiography,  PTCA of CFX OM2  Indication: Patient with prior head and neck cancer, no definite ECG change, and borderline pos enzyme.  History of prior cath 2004 in [facility].   Diagnostic Procedure Details: The right groin was prepped, draped, and anesthetized with 1% lidocaine. Using the modified Seldinger technique, a 4 French sheath was introduced into the right femoral artery. Standard Judkins catheters were used for selective coronary angiography and left ventriculography. Catheter exchanges were performed over a wire.  The diagnostic procedure was well-tolerated without immediate complications.  PROCEDURAL FINDINGS Hemodynamics: AO 102/73 (88) LV 131/18 No gradient  Coronary angiography: Coronary dominance: right  Left mainstem: Large caliber and free of significant disease  Left anterior descending (LAD): Large caliber vessel which goes to the apex and provides a large septal and diagonal.  There is 40% ostial narrowing, and 40% eccentric narrowing just after the septal.  There is mild luminal irregularity noted in the distal vessl.    Left circumflex (LCx): Dominant vessel.  There is a large OM1 with about 50-60% focal narrowing in the mid vessel.  The OM2 is smaller, and has a daylight, thrombotic lesion in the mid portion.  This appears likely to be the culprit.  The av circumflex provides a dominant PDA, and this has about 30% in the AV portion.  Critical disease is not noted.    Right coronary artery (RCA): Non dominant vessel with a very small distal vessel.  This is known to be occluded in [facility].  There is a recanalized total occlusion with diffuse irregularity in the mid vessel with timi 3 flow distally.    Left ventriculography: Left ventricular systolic function is normal, LVEF is estimated at 50%, there  is no significant mitral regurgitation   PCI Procedure Note:  Following the diagnostic procedure, the decision was made to proceed with PCI. The sheath was upsized to a 6 [place]. Weight-based heparin was given and a second bolus of eptifibatide was also given.  ACT rose to between 200-300 was given.  Once a therapeutic ACT was achieved, a 6 [place] JL3.5 guide catheter was inserted.  A prowater coronary guidewire was used to cross the lesion.  The lesion was dilated with a 2.00 mm balloon.  We felt that a 2.25 mm DES could be placed, but would likely be oversized.  With a good balloon result, I carefully elected not to place a small caliber non DES with the quality of the balloon result.    Following PCI, there was 20% residual stenosis and TIMI-3 flow. Final angiography confirmed an excellent result. Femoral sheath was secured into placed.  The patient tolerated the PCI procedure well. There were no immediate procedural complications.  The patient was transferred to the post catheterization recovery area for further monitoring.  PCI Data: Vessel - OM2/Segment - 21 Percent Stenosis (pre)  99 and hazy TIMI-flow 3 Stent none Percent Stenosis (post) 20 TIMI-flow (post) 3  Final Conclusions:   1,  Successful PTCA only of a culprit, small caliber OM2  Recommendations:  1.  Aggressive RFR 2.  Probably DAPT for one to two months only 3.  Eptifibatide for only 4-6 hours.   Reviewed in detail with patient's mother.    Shawnie Pons 06/25/2011, 11:55 AM

## 2011-06-26 DIAGNOSIS — I200 Unstable angina: Secondary | ICD-10-CM

## 2011-06-26 DIAGNOSIS — I214 Non-ST elevation (NSTEMI) myocardial infarction: Principal | ICD-10-CM

## 2011-06-26 LAB — BASIC METABOLIC PANEL
Chloride: 107 mEq/L (ref 96–112)
GFR calc Af Amer: >90 mL/min (ref >90)
Potassium: 3.9 mEq/L (ref 3.5–5.1)

## 2011-06-26 LAB — CBC
HCT: 44.6 % (ref 39.0–52.0)
Platelets: 138 10*3/uL — ABNORMAL LOW (ref 150–400)
RDW: 13.8 % (ref 11.5–15.5)
WBC: 4.7 10*3/uL (ref 4.0–10.5)

## 2011-06-26 MED ORDER — METOPROLOL TARTRATE 12.5 MG HALF TABLET
12.5000 mg | ORAL_TABLET | Freq: Two times a day (BID) | ORAL | Status: DC
  Administered 2011-06-26 – 2011-06-27 (×3): 12.5 mg via ORAL
  Filled 2011-06-26 (×4): qty 1

## 2011-06-26 MED ORDER — SODIUM CHLORIDE 0.9 % IJ SOLN
3.0000 mL | Freq: Two times a day (BID) | INTRAMUSCULAR | Status: DC
  Administered 2011-06-26 – 2011-06-27 (×2): 3 mL via INTRAVENOUS

## 2011-06-26 NOTE — Progress Notes (Signed)
CARDIAC REHAB PHASE I   PRE:  Rate/Rhythm: 58 SB    BP: sitting 121/79    SaO2:   MODE:  Ambulation: 600 ft   POST:  Rate/Rhythm: 70 SR    BP: sitting 131/89     SaO2:   Tolerated well, no CP. Ed completed. Requests his name be sent to [facility]. [id number]  Harriet Masson CES, ACSM

## 2011-06-26 NOTE — Progress Notes (Signed)
Subjective:  No further chest pain.  Just waking up.  Has not ambulated yet.    Objective:  Vital Signs in the last 24 hours: Temp:  [97.9 F (36.6 C)-98.7 F (37.1 C)] 98.5 F (36.9 C) (04/16 0400) Pulse Rate:  [57-85] 65  (04/16 0700) Resp:  [5-19] 13  (04/16 0100) BP: (98-165)/(50-115) 120/79 mmHg (04/16 0700) SpO2:  [97 %-100 %] 98 % (04/16 0700) Weight:  [187 lb 6.3 oz (85 kg)] 187 lb 6.3 oz (85 kg) (04/16 0500)  Intake/Output from previous day: 04/15 0701 - 04/16 0700 In: 1619.5 [P.O.:480; I.V.:1129.5] Out: 2700 [Urine:2700]   Physical Exam: General: Well developed, well nourished, in no acute distress. Head:  Normocephalic and atraumatic.  Prior surgical scars Lungs: Clear to auscultation and percussion.  Prolonged expiration Heart: Normal S1 and S2.  No murmur, rubs or gallops.  Pulses: Pulses normal in all 4 extremities. Extremities: No clubbing or cyanosis. No edema. Neurologic: Alert and oriented x 3.    Lab Results:  Basename 06/25/11 1200 06/25/11 0555  WBC 4.6 6.3  HGB 14.0 14.2  PLT 123* 128*    Basename 06/25/11 0555 06/24/11 1300  NA 137 137  K 3.8 3.8  CL 106 105  CO2 23 23  GLUCOSE 100* 122*  BUN 11 11  CREATININE 0.92 0.88    Basename 06/25/11 0555 06/25/11  TROPONINI 0.99* 1.78*   Hepatic Function Panel  Basename 06/24/11 1300  PROT 6.4  ALBUMIN 4.0  AST 21  ALT 11  ALKPHOS 85  BILITOT 0.6  BILIDIR --  IBILI --    Basename 06/25/11 0555  CHOL 196   No results found for this basename: PROTIME in the last 72 hours  Imaging: Ct Angio Chest W/cm &/or Wo Cm  06/24/2011  *RADIOLOGY REPORT*  Clinical Data: 51 year old male with chest pain and history of melanoma and tonsillar cancer.  CT ANGIOGRAPHY CHEST  Technique:  Multidetector CT imaging of the chest using the standard protocol during bolus administration of intravenous contrast. Multiplanar reconstructed images including MIPs were obtained and reviewed to evaluate the  vascular anatomy.  Contrast: OMNIPAQUE IOHEXOL 350 MG/ML SOLN  Comparison: 06/24/2011 chest radiograph and 03/16/2008 PET-CT  Findings: This is a technically satisfactory study.  No pulmonary emboli are identified. There is no evidence of thoracic aortic aneurysm. No pleural or pericardial effusions are identified. The heart and great vessels are within normal limits. There are no enlarged lymph nodes present.  There is no evidence of airspace disease, consolidation, suspicious nodule/mass or endobronchial/endotracheal lesion. Mild biapical pleuroparenchymal scarring is noted.  Calcification within the right adrenal gland likely represents prior infection or inflammation.  No other upper abdominal abnormalities are noted. No acute or suspicious bony abnormalities are present.  IMPRESSION: No evidence of acute abnormality - no evidence of pulmonary emboli or thoracic aortic aneurysm.  Original Report Authenticated By: Rosendo Gros, M.D.   Dg Chest Port 1 View  06/24/2011  *RADIOLOGY REPORT*  Clinical Data: Chest pain.  PORTABLE CHEST - 1 VIEW  Comparison: 02/24/2010.  Findings: The heart size is normal.  The lungs are clear.  The visualized soft tissues and bony thorax unremarkable.  IMPRESSION: Negative chest.  Original Report Authenticated By: Jamesetta Orleans. MATTERN, M.D.    EKG:  No acute changes.    Assessment/Plan:  Patient Active Hospital Problem List: Non-ST elevation myocardial infarction (NSTEMI), initial care episode (06/25/2011)   Assessment: stable after cath   Plan: continue DAPT for one to two months.  Hypercholesterolemia   Plan medical therapy for now.         Shawnie Pons, MD, [facility], FSCAI 06/26/2011, 7:23 AM

## 2011-06-27 DIAGNOSIS — E039 Hypothyroidism, unspecified: Secondary | ICD-10-CM

## 2011-06-27 DIAGNOSIS — F419 Anxiety disorder, unspecified: Secondary | ICD-10-CM

## 2011-06-27 MED ORDER — PANTOPRAZOLE SODIUM 40 MG PO TBEC: 40.0000 mg | DELAYED_RELEASE_TABLET | Freq: Every day | ORAL | Status: DC

## 2011-06-27 MED ORDER — METOPROLOL TARTRATE 12.5 MG HALF TABLET: 12.5000 mg | ORAL_TABLET | Freq: Two times a day (BID) | ORAL | Status: DC

## 2011-06-27 MED ORDER — ASPIRIN 81 MG PO CHEW: 81.0000 mg | CHEWABLE_TABLET | Freq: Every day | ORAL | Status: AC

## 2011-06-27 MED ORDER — NITROGLYCERIN 0.4 MG SL SUBL: 0.4000 mg | SUBLINGUAL_TABLET | SUBLINGUAL | Status: DC | PRN

## 2011-06-27 MED ORDER — CLOPIDOGREL BISULFATE 75 MG PO TABS: 75.0000 mg | ORAL_TABLET | Freq: Every day | ORAL | Status: DC

## 2011-06-27 MED ORDER — ROSUVASTATIN CALCIUM 10 MG PO TABS: 10.0000 mg | ORAL_TABLET | Freq: Every day | ORAL | Status: DC

## 2011-06-27 NOTE — Discharge Summary (Signed)
Discharge Summary   Patient ID: Bobby Floyd,  MRN: [id number], DOB/AGE: 1960-10-30 51 y.o.  Admit date: 06/24/2011 Discharge date: 06/27/2011  Discharge Diagnoses Principal Problem:  *Non-ST elevation myocardial infarction (NSTEMI), initial care episode Active Problems:  GERD (gastroesophageal reflux disease)  Hypothyroidism  Anxiety   Allergies Allergies  Allergen Reactions  . Benadryl (Altaryl)     "crazy"  . Ondansetron Other (See Comments)    Headaches  . Phenergan Nausea And Vomiting  . Propofol     "violent"    Diagnostic Studies/Procedures  PORTABLE CHEST - 1 VIEW- 06/24/11  Comparison: 02/24/2010.  Findings: The heart size is normal. The lungs are clear. The  visualized soft tissues and bony thorax unremarkable.  IMPRESSION:  Negative chest.  CT ANGIOGRAPHY CHEST- 06/24/11 Technique: Multidetector CT imaging of the chest using the  standard protocol during bolus administration of intravenous  contrast. Multiplanar reconstructed images including MIPs were  obtained and reviewed to evaluate the vascular anatomy.  Contrast: OMNIPAQUE IOHEXOL 350 MG/ML SOLN  Comparison: 06/24/2011 chest radiograph and 03/16/2008 PET-CT  Findings: This is a technically satisfactory study.  No pulmonary emboli are identified.  There is no evidence of thoracic aortic aneurysm.  No pleural or pericardial effusions are identified.  The heart and great vessels are within normal limits.  There are no enlarged lymph nodes present.  There is no evidence of airspace disease, consolidation, suspicious  nodule/mass or endobronchial/endotracheal lesion.  Mild biapical pleuroparenchymal scarring is noted.  Calcification within the right adrenal gland likely represents  prior infection or inflammation. No other upper abdominal  abnormalities are noted.  No acute or suspicious bony abnormalities are present.  IMPRESSION:  No evidence of acute abnormality - no evidence of  pulmonary emboli  or thoracic aortic aneurysm.  CARDIAC CATHETERIZATION- 06/25/11  Procedure: Left Heart Cath, Selective Coronary Angiography, LV angiography, PTCA of CFX OM2  Indication: Patient with prior head and neck cancer, no definite ECG change, and borderline pos enzyme. History of prior cath 2004 in [facility].  Diagnostic Procedure Details: The right groin was prepped, draped, and anesthetized with 1% lidocaine. Using the modified Seldinger technique, a 4 French sheath was introduced into the right femoral artery. Standard Judkins catheters were used for selective coronary angiography and left ventriculography. Catheter exchanges were performed over a wire. The diagnostic procedure was well-tolerated without immediate complications.  PROCEDURAL FINDINGS  Hemodynamics:  AO 102/73 (88)  LV 131/18  No gradient  Coronary angiography:  Coronary dominance: right  Left mainstem: Large caliber and free of significant disease  Left anterior descending (LAD): Large caliber vessel which goes to the apex and provides a large septal and diagonal. There is 40% ostial narrowing, and 40% eccentric narrowing just after the septal. There is mild luminal irregularity noted in the distal vessl.  Left circumflex (LCx): Dominant vessel. There is a large OM1 with about 50-60% focal narrowing in the mid vessel. The OM2 is smaller, and has a daylight, thrombotic lesion in the mid portion. This appears likely to be the culprit. The av circumflex provides a dominant PDA, and this has about 30% in the AV portion. Critical disease is not noted.  Right coronary artery (RCA): Non dominant vessel with a very small distal vessel. This is known to be occluded in [facility]. There is a recanalized total occlusion with diffuse irregularity in the mid vessel with timi 3 flow distally.  Left ventriculography: Left ventricular systolic function is normal, LVEF is estimated at 50%, there  is no significant mitral regurgitation    PCI Procedure Note: Following the diagnostic procedure, the decision was made to proceed with PCI. The sheath was upsized to a 6 [place]. Weight-based heparin was given and a second bolus of eptifibatide was also given. ACT rose to between 200-300 was given. Once a therapeutic ACT was achieved, a 6 [place] JL3.5 guide catheter was inserted. A prowater coronary guidewire was used to cross the lesion. The lesion was dilated with a 2.00 mm balloon. We felt that a 2.25 mm DES could be placed, but would likely be oversized. With a good balloon result, I carefully elected not to place a small caliber non DES with the quality of the balloon result. Following PCI, there was 20% residual stenosis and TIMI-3 flow. Final angiography confirmed an excellent result. Femoral sheath was secured into placed. The patient tolerated the PCI procedure well. There were no immediate procedural complications. The patient was transferred to the post catheterization recovery area for further monitoring.  PCI Data:  Vessel - OM2/Segment - 21  Percent Stenosis (pre) 99 and hazy  TIMI-flow 3  Stent none  Percent Stenosis (post) 20  TIMI-flow (post) 3  Final Conclusions:  1, Successful PTCA only of a culprit, small caliber OM2  Recommendations:  1. Aggressive RFR  2. Probably DAPT for one to two months only  3. Eptifibatide for only 4-6 hours.   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 Dayton Scrape), CAD (s/p coronary angioplasty at [facility]), SCC/BCC/melanoma, , hypothyroidism, GERD and anxiety who was admitted to [facility] on 06/24/11 for NSTEMI.   The patient reported experiencing chest pain around 3:30 AM the date of admission, described as constant, radiating to the L arm, with associated diaphoresis. He called EMS. This was originally called a  code STEMI, however EKG in the field and sent by [facility] did not demonstrate ST segment elevation. Repeat EKG on ED arrival confirmed the lack of ST segment elevation. Initial cardiac biomarkers were found to be WNL. His chest pain was relieved with NTG. Heparin was started. CXR in the ED revealed no acute cardiopulmonary abnormalities.  Given his history of malignancy, a CT-A chest was ordered revealing no evidence of PE, or aortic dissection, no lesions,  airspace disease or consolidation identified.   On interviewing, the patient reported a prior history of MI with prior cardiac catheterization, but no stent or angioplasty. There was some confusion as to whether this was a true MI secondary to coronary artery stenosis. He was subsequently admitted by the cardiology service for ACS rule-out.   Hospital Course   Shortly after admission, the patient was started on NTG gtt. This unrelieved his chest pain, and morphine IV was added with appropriate relief. The second and third sets of cardiac biomarkers returned positive. The patient was started on an integrillin drip, and the plan was made to purse cardiac catheterization.   The patient was informed, consented and prepped for the procedure. This was accessed via the R groin. The procedure revealed a culprit mid-OM2 lesion with 99% stenosis s/p balloon angioplasty with reduction to 20%. It was determined that a 2.25 mm DES could be placed, but would be oversized. Given the balloon result noted above, the decision was made not to place a small caliber non DES. TIMI 3 flow was observed pre- and post-intervention.  The patient tolerated the procedure well without complications. The recommendation was made to pursue aggressive risk factor reduction, continue DAPT- ASA/Plavix x 1-2 months. Integrillin gtt was continued for 4-6 hours post-cath. He was subsequently transferred to CRU in good condition.   He ambulated well the following day without evidence of  hematoma, pseudoaneurysm or infection to the groin access site. He remained stable throughout the admission. His chest pain improved post-cath. He was started on Lopressor and Protonix in addition to the above.  Today, he is stable, at baseline and will be discharged home. He will continue ASA/Plavix/BB/statin/NTG SL PRN/PPI and all other outpatient meds. Nexium has been replaced with Protonix given Plavix initiation. He will follow-up with Dr. Riley Kill in < 14 days. At that time, a definitive recommendation for DAPT period will be determined. He has been advised not to drive x 2 weeks. He should follow-up with his PCP after this admission. This information, including activity restrictions has been clearly outlined in the discharge AVS.   Discharge Vitals:  Blood pressure 128/81, pulse 74, temperature 97.5 F (36.4 C), temperature source Oral, resp. rate 14, height 6\' 2"  (1.88 m), weight 80.4 kg (177 lb 4 oz), SpO2 100.00%.   Weight change: 97.7 kg (215 lb 6.2 oz)  Labs: Recent Labs  [facility] 06/26/11 0637 06/25/11 1200   WBC 4.7 4.6   HGB 15.2 14.0   HCT 44.6 40.8   MCV 92.1 91.5   PLT 138* 123*    Lab 06/26/11 0637 06/25/11 0555 06/24/11 1300  NA 139 137 137  K 3.9 3.8 3.8  CL 107 106 105  CO2 22 23 23   BUN 8 11 11   CREATININE 0.82 0.92 0.88  CALCIUM 9.0 9.0 9.0  PROT -- -- 6.4  BILITOT -- -- 0.6  ALKPHOS -- -- 85  ALT -- -- 11  AST -- -- 21  AMYLASE -- -- --  LIPASE -- -- --  GLUCOSE 99 100* 122*   Recent Labs  Basename 06/24/11 1300   HGBA1C 5.3   Recent Labs  Basename 06/25/11 0555 06/25/11 06/24/11 1300   CKTOTAL 99 124 120   CKMB 4.6* 5.8* 2.6   CKMBINDEX -- -- --   TROPONINI 0.99* 1.78* <0.30   No components found with this basename: POCBNP Recent Labs  Basename 06/25/11 0555   CHOL 196   HDL 67   LDLCALC 112*   TRIG 86   CHOLHDL 2.9   LDLDIRECT --    Basename 06/24/11 1300  TSH 2.097  T4TOTAL --  T3FREE --  THYROIDAB --    Disposition:    Discharge Orders    Future Appointments: Provider: Department: Dept Phone: Center:   07/10/2011 11:30 AM Herby Abraham, MD [facility] [phone] [facility]   04/22/2012 3:00 PM Maryln Gottron, MD [facility] [phone] None     Follow-up Information    Follow up with Shawnie Pons, MD on 07/10/2011. (At 11:30 AM for follow-up after this hospitalization. )    Contact information:   [address] [place] [address] [phone]       Schedule an appointment as soon as possible for a visit with [facility]. (For this hospitalization. )    Contact information:   [address] [place] [phone]          Discharge Medications:  Medication List  As of 06/27/2011 11:53 AM   START taking these medications  aspirin 81 MG chewable tablet   Chew 1 tablet (81 mg total) by mouth daily.      clopidogrel 75 MG tablet   Commonly known as: PLAVIX   Take 1 tablet (75 mg total) by mouth daily with breakfast.      metoprolol tartrate 12.5 mg Tabs   Commonly known as: LOPRESSOR   Take 0.5 tablets (12.5 mg total) by mouth 2 (two) times daily.      nitroGLYCERIN 0.4 MG SL tablet   Commonly known as: NITROSTAT   Place 1 tablet (0.4 mg total) under the tongue every 5 (five) minutes x 3 doses as needed for chest pain.      pantoprazole 40 MG tablet   Commonly known as: PROTONIX   Take 1 tablet (40 mg total) by mouth daily.      rosuvastatin 10 MG tablet   Commonly known as: CRESTOR   Take 1 tablet (10 mg total) by mouth daily.         CONTINUE taking these medications         dronabinol 10 MG capsule   Commonly known as: MARINOL      levothyroxine 100 MCG tablet   Commonly known as: SYNTHROID, LEVOTHROID      LORazepam 1 MG tablet   Commonly known as: ATIVAN      NORCO 10-325 MG per tablet   Generic drug: HYDROcodone-acetaminophen      sodium fluoride 1.1 % Crea  dental cream   Commonly known as: PREVIDENT 5000 PLUS   Apply a thin ribbon of toothpaste to toothbrush at bedtime. Brush for 2 minutes. Spit out excess. Do not swallow.   Repeat nightly.         STOP taking these medications         esomeprazole 40 MG capsule          Where to get your medications    These are the prescriptions that you need to pick up. We sent them to a specific pharmacy, so you will need to go there to get them.   [id number] [place] [facility]an, [place] - [address]    [address] [place] [address]    Phone: [phone]        clopidogrel 75 MG tablet   metoprolol tartrate 12.5 mg Tabs   nitroGLYCERIN 0.4 MG SL tablet   pantoprazole 40 MG tablet   rosuvastatin 10 MG tablet         Information on where to get these meds is not yet available. Ask your nurse or doctor.         aspirin 81 MG chewable tablet           Outstanding Labs/Studies: None  Duration of Discharge Encounter: Greater than 30 minutes including physician time.  Signed, R. Hurman Horn, PA-C 06/27/2011, 11:53 AM

## 2011-06-27 NOTE — Discharge Instructions (Signed)
PLEASE REMEMBER TO BRING ALL OF YOUR MEDICATIONS TO EACH OF YOUR FOLLOW-UP OFFICE VISITS.  PLEASE TAKE ALL NEW MEDICATIONS AS PRESCRIBED. YOU WILL TAKE BOTH ASPIRIN AND PLAVIX FOR 1-2 MONTHS. THIS WILL BE DETERMINED DEFINITIVELY ON FOLLOW-UP. YOU WILL TAKE PROTONIX IN PLACE OF NEXIUM. NEXIUM HAS BEEN SHOWN TO REDUCE THE EFFICACY OF PLAVIX.   PLEASE ATTEND ALL SCHEDULED FOLLOW-UP APPOINTMENTS.  Activity: Increase activity slowly as tolerated. You may shower, but no soaking baths for 5 days. No driving for 2 weeks. No lifting over 5 lbs for 5 days. No sexual activity for 5 days.   You May Return to Work: in 2 weeks (if applicable)  Wound Care: You may wash cath site gently with soap and water. Keep cath site clean and dry. If you notice pain, swelling, bleeding or pus at your cath site, please call [phone].

## 2011-06-27 NOTE — Progress Notes (Signed)
CARDIAC REHAB PHASE I   PRE:  Rate/Rhythm: 79 SR    BP: sitting 128/85    SaO2:   MODE:  Ambulation: 500 ft   POST:  Rate/Rhythm: 90 SR    BP: sitting 131/85     SaO2:   Tolerated well, no c/o. No questions. [id number]  Harriet Masson CES, ACSM

## 2011-06-27 NOTE — Progress Notes (Signed)
Subjective:  Feels good.  A little soreness, otherwise ok.  No current chest pain.    Objective:  Vital Signs in the last 24 hours: Temp:  [97.4 F (36.3 C)-99.2 F (37.3 C)] 97.5 F (36.4 C) (04/17 0746) Pulse Rate:  [56-76] 74  (04/17 0746) Resp:  [12-16] 14  (04/17 0746) BP: (96-145)/(48-93) 128/81 mmHg (04/17 0748) SpO2:  [99 %-100 %] 100 % (04/17 0746) Weight:  [177 lb 4 oz (80.4 kg)-402 lb 12.5 oz (182.7 kg)] 177 lb 4 oz (80.4 kg) (04/17 0645)  Intake/Output from previous day: 04/16 0701 - 04/17 0700 In: 1560 [P.O.:1560] Out: 1 [Urine:1]   Physical Exam: General: Thin gentleman Head:  Normocephalic and atraumatic. Lungs: Clear to auscultation and percussion. Heart: Normal S1 and S2.  No murmur, rubs or gallops.  Pulses: Pulses normal in all 4 extremities. Extremities: No clubbing or cyanosis. No edema. Neurologic: Alert and oriented x 3. Minimial echymosis  At cath site.  No hematoma.      Lab Results:  Basename 06/26/11 0637 06/25/11 1200  WBC 4.7 4.6  HGB 15.2 14.0  PLT 138* 123*    Basename 06/26/11 0637 06/25/11 0555  NA 139 137  K 3.9 3.8  CL 107 106  CO2 22 23  GLUCOSE 99 100*  BUN 8 11  CREATININE 0.82 0.92    Basename 06/25/11 0555 06/25/11  TROPONINI 0.99* 1.78*   Hepatic Function Panel  Basename 06/24/11 1300  PROT 6.4  ALBUMIN 4.0  AST 21  ALT 11  ALKPHOS 85  BILITOT 0.6  BILIDIR --  IBILI --    Basename 06/25/11 0555  CHOL 196   No results found for this basename: PROTIME in the last 72 hours  Imaging: No results found.  EKG:  4/16 No acute changes    Assessment/Plan:  Patient Active Hospital Problem List: Non-ST elevation myocardial infarction (NSTEMI), initial care episode (06/25/2011)   Assessment: stable post simple PTCA   Plan: continue medication.  No driving for 2 weeks   Follow up with me in office  Hypercholesterolemia   Home.  Crestor 10mg  daily.    More than thirty minutes combined dc time.        Shawnie Pons, MD, [facility], FSCAI 06/27/2011, 9:21 AM

## 2011-07-10 ENCOUNTER — Ambulatory Visit (INDEPENDENT_AMBULATORY_CARE_PROVIDER_SITE_OTHER): Payer: Medicare Other | Admitting: Cardiology

## 2011-07-10 ENCOUNTER — Encounter: Payer: Self-pay | Admitting: Cardiology

## 2011-07-10 VITALS — BP 128/64 | HR 74 | Resp 18 | Ht 74.0 in | Wt 186.4 lb

## 2011-07-10 DIAGNOSIS — E7800 Pure hypercholesterolemia, unspecified: Secondary | ICD-10-CM | POA: Insufficient documentation

## 2011-07-10 DIAGNOSIS — I214 Non-ST elevation (NSTEMI) myocardial infarction: Secondary | ICD-10-CM

## 2011-07-10 DIAGNOSIS — E039 Hypothyroidism, unspecified: Secondary | ICD-10-CM

## 2011-07-10 NOTE — Assessment & Plan Note (Signed)
Stable at present.  No pain.  Wants to go back and drive.  We will have him return in a week and get a treadmill test.  We discussed his meds.

## 2011-07-10 NOTE — Progress Notes (Signed)
Addended by: Iona Coach on: 07/10/2011 02:21 PM   Modules accepted: Orders

## 2011-07-10 NOTE — Assessment & Plan Note (Signed)
He is taking synthroid.

## 2011-07-10 NOTE — Assessment & Plan Note (Signed)
Did not take his meds. Will not take meds.  No change.

## 2011-07-10 NOTE — Progress Notes (Signed)
HPI:  He is seen in follow up. He was noted to the hospital with chest pain. His enzymes after the first day turned positive. His electrocardiogram was unremarkable. He underwent cath and balloon angioplasty only of a single, small lesion.  It was too small for DES, and there was a good angio result.  Therefore, he was treated conservatively.  Notably, he admitted to poor medical compliance, and since dc has not taken clopidogrel, statin, or beta blocker.  He says all medications since his cancer make him feel poorly, so he got none of them filled.  He presents today.  He has been fine since dc, but today got a stomach virus from his girlfriend.  He started vomiting on the way to clinic, and has done this twice since arriving.  Denies any chest pain.    Current Outpatient Prescriptions  Medication Sig Dispense Refill  . aspirin 81 MG chewable tablet Chew 1 tablet (81 mg total) by mouth daily.      Marland Kitchen HYDROcodone-acetaminophen (NORCO) 10-325 MG per tablet Take 1 tablet by mouth every 6 (six) hours as needed. For pain      . levothyroxine (SYNTHROID, LEVOTHROID) 100 MCG tablet Take 100 mcg by mouth daily.        Marland Kitchen LORazepam (ATIVAN) 1 MG tablet Take 2 mg by mouth at bedtime. Take 2 tablets at bedtime      . pantoprazole (PROTONIX) 40 MG tablet Take 1 tablet (40 mg total) by mouth daily.  30 tablet  3  . sodium fluoride (PREVIDENT 5000 PLUS) 1.1 % CREA dental cream Apply a thin ribbon of toothpaste to toothbrush at bedtime. Brush for 2 minutes. Spit out excess. Do not swallow.  Repeat nightly.  1 Tube  11  . clopidogrel (PLAVIX) 75 MG tablet Take 1 tablet (75 mg total) by mouth daily with breakfast.  30 tablet  1  . dronabinol (MARINOL) 10 MG capsule Take 10 mg by mouth 2 (two) times daily before a meal. For nausea      . metoprolol tartrate (LOPRESSOR) 12.5 mg TABS Take 0.5 tablets (12.5 mg total) by mouth 2 (two) times daily.  60 tablet  3  . nitroGLYCERIN (NITROSTAT) 0.4 MG SL tablet Place 1 tablet  (0.4 mg total) under the tongue every 5 (five) minutes x 3 doses as needed for chest pain.  25 tablet  3  . rosuvastatin (CRESTOR) 10 MG tablet Take 1 tablet (10 mg total) by mouth daily.  30 tablet  3    Allergies  Allergen Reactions  . Benadryl (Diphenhydramine Hcl)     "crazy"  . Ondansetron Other (See Comments)    Headaches  . Promethazine Hcl Nausea And Vomiting  . Propofol     "violent"    Past Medical History  Diagnosis Date  . Hypothyroidism   . 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 Myocardial infarction     x2  . S/P coronary angioplasty 2004    [facility]  . Degenerative joint disease of cervical spine   . GERD (gastroesophageal reflux disease)   . Coronary artery disease   . Basal cell carcinoma of skin   . Melanoma   . Tonsillar cancer     Squamous cell, on the left, stage IV    Past Surgical History  Procedure Date  . Tonsillectomy 2009    left Dr.  Gordy Levan [facility]  . Posterior fusion cervical spine September 2012    C5-6  . Pharyngectomy 2009    left Dr. Gordy Levan  . Partial glossectomy     left Dr. Gordy Levan  . Neck dissection     left Dr. Gordy Levan  . Multiple tooth extractions 2009  . Skin cancer excision     Multiple squamous and basal cell carcinomas  . Melanoma excision     Right forearm  . Cervical discectomy 2010    C 5-6  . Esophagogastroduodenoscopy 03/26/2011    Procedure: ESOPHAGOGASTRODUODENOSCOPY (EGD);  Surgeon: Iva Boop, MD;  Location: [facility];  Service: Endoscopy;  Laterality: N/A;  . Balloon dilation 03/26/2011    Procedure: BALLOON DILATION;  Surgeon: Iva Boop, MD;  Location: [facility];  Service: Endoscopy;  Laterality: N/A;  . Colonoscopy 03/26/2011    Procedure: COLONOSCOPY;  Surgeon: Iva Boop, MD;  Location: [facility];  Service: Endoscopy;  Laterality: N/A;    Family History  Problem Relation Age of Onset  . Colon  cancer Maternal Uncle   . Heart disease Maternal Grandmother   . Cirrhosis Maternal Grandfather     alcoholic  . Cirrhosis Maternal Uncle     alcoholic  . Hypothyroidism Mother   . Goiter Mother     History   Social History  . Marital Status: Divorced    Spouse Name: N/A    Number of Children: N/A  . Years of Education: N/A   Occupational History  . Disabled    Social History Main Topics  . Smoking status: Former Smoker -- 1.5 packs/day for 20 years    Types: Cigarettes    Quit date: 08/11/2007  . Smokeless tobacco: Never Used  . Alcohol Use: No  . Drug Use: No  . Sexually Active: Not on file   Other Topics Concern  . Not on file   Social History Narrative   Former Nutritional therapist, he is disabled    ROS: Please see the HPI.  All other systems reviewed and negative.  PHYSICAL EXAM:  BP 128/64  Pulse 74  Resp 18  Ht 6\' 2"  (1.88 m)  Wt 186 lb 6.4 oz (84.55 kg)  BMI 23.93 kg/m2  SpO2 98%  Limited exam due to viral illness.    General: Well developed, well nourished, in no acute distress. Head:  Normocephalic and atraumatic. Neck: no JVD Lungs: Clear to auscultation and percussion. Heart: Normal S1 and S2.  No murmur, rubs or gallops.  Extremities: No clubbing or cyanosis. No edema. Neurologic: Alert and oriented x 3.  EKG:  NSR.  WNL.    ASSESSMENT AND PLAN:

## 2011-07-10 NOTE — Patient Instructions (Signed)
Your physician has requested that you have an exercise tolerance test in 1 WEEK. For further information please visit [email or web address] Please also follow instruction sheet, as given.

## 2011-07-16 ENCOUNTER — Encounter: Payer: Medicare Other | Admitting: Physician Assistant

## 2011-07-16 ENCOUNTER — Encounter: Payer: Self-pay | Admitting: *Deleted

## 2011-07-16 ENCOUNTER — Ambulatory Visit (INDEPENDENT_AMBULATORY_CARE_PROVIDER_SITE_OTHER): Payer: Medicare Other | Admitting: Physician Assistant

## 2011-07-16 DIAGNOSIS — I214 Non-ST elevation (NSTEMI) myocardial infarction: Secondary | ICD-10-CM

## 2011-07-16 NOTE — Patient Instructions (Signed)
Your physician recommends that you schedule a follow-up appointment in: 3 months with Dr. Stuckey 

## 2011-07-16 NOTE — Procedures (Signed)
Exercise Treadmill Test  Pre-Exercise Testing Evaluation Rhythm: normal sinus  Rate: 63   PR:  .13 QRS:  .10  QT:  .40 QTc: .40     Test  Exercise Tolerance Test Ordering MD: Shawnie Pons, MD  Interpreting MD: Jacolyn Reedy PA-C  Unique Test No: 1  Treadmill:  1  Indication for ETT: post-MI  Contraindication to ETT: No   Stress Modality: exercise - treadmill  Cardiac Imaging Performed: non   Protocol: standard Bruce - maximal  Max BP: 158/75  Max MPHR (bpm):  172 85% MPR (bpm):  146  MPHR obtained (bpm):  141 % MPHR obtained:  83  Reached 85% MPHR (min:sec):  na Total Exercise Time (min-sec):  10:20  Workload in METS:  12.3 Borg Scale: 15  Reason ETT Terminated:  fatigue    ST Segment Analysis At Rest: normal ST segments - no evidence of significant ST depression With Exercise: no evidence of significant ST depression  Other Information Arrhythmia:  No Angina during ETT:  absent (0) Quality of ETT:  non-diagnostic  ETT Interpretation:  normal - no evidence of ischemia by ST analysis  Comments: Good exercise tolerance. No chest pain or EKG changes  Recommendations: Per Dr. Riley Kill

## 2011-07-18 NOTE — Discharge Summary (Signed)
Agree with above.  See my note. Plan early hospital follow up.

## 2011-08-21 ENCOUNTER — Other Ambulatory Visit: Payer: Self-pay | Admitting: Dermatology

## 2011-09-03 ENCOUNTER — Other Ambulatory Visit: Payer: Self-pay | Admitting: Dermatology

## 2011-10-04 ENCOUNTER — Telehealth: Payer: Self-pay | Admitting: Gastroenterology

## 2011-10-04 NOTE — Telephone Encounter (Signed)
Scheduled patient with Willette Cluster, NP on 10/09/11 at 11:00 AM. Britta Mccreedy to fax records.

## 2011-10-09 ENCOUNTER — Encounter: Payer: Self-pay | Admitting: Nurse Practitioner

## 2011-11-05 NOTE — Progress Notes (Signed)
This encounter was created in error - please disregard.  This encounter was created in error - please disregard.

## 2011-11-11 ENCOUNTER — Other Ambulatory Visit (HOSPITAL_COMMUNITY): Payer: Self-pay | Admitting: Physician Assistant

## 2012-04-12 ENCOUNTER — Other Ambulatory Visit (HOSPITAL_COMMUNITY): Payer: Self-pay | Admitting: Physician Assistant

## 2012-04-17 ENCOUNTER — Other Ambulatory Visit (HOSPITAL_COMMUNITY): Payer: Self-pay | Admitting: Physician Assistant

## 2012-04-21 ENCOUNTER — Encounter: Payer: Self-pay | Admitting: Radiation Oncology

## 2012-04-22 ENCOUNTER — Ambulatory Visit: Payer: Medicare Other | Admitting: Radiation Oncology

## 2012-04-29 ENCOUNTER — Encounter: Payer: Self-pay | Admitting: *Deleted

## 2012-04-29 ENCOUNTER — Encounter: Payer: Self-pay | Admitting: Radiation Oncology

## 2012-04-29 ENCOUNTER — Ambulatory Visit
Admission: RE | Admit: 2012-04-29 | Discharge: 2012-04-29 | Disposition: A | Discharge status: Home / Self Care | Payer: No Typology Code available for payment source | Source: Ambulatory Visit | Attending: Radiation Oncology | Admitting: Radiation Oncology

## 2012-04-29 VITALS — BP 137/83 | HR 71 | Temp 97.9°F | Resp 20 | Wt 181.4 lb

## 2012-04-29 DIAGNOSIS — C099 Malignant neoplasm of tonsil, unspecified: Secondary | ICD-10-CM

## 2012-04-29 NOTE — Progress Notes (Signed)
Patient here for follow up s/p left tonsil rad txs  10/21/2007-12/11/2007 squamous cell ca Alert,oriented x3, patient c/o pain shoulder, neck, takes norco prn, eating soft foods, no breads, no sliva stated, drinks water,mtn dews, carnation instant breakfast, appetite poor stated,  3:30 PM

## 2012-04-29 NOTE — Progress Notes (Signed)
Clinical Social Work received referral from radiation oncologist regarding disability questions. CSW met briefly with patient and patient's friend in exam room and answered patients questions.  Patient currently appealing denial of disability benefits.  Kathrin Penner, MSW, LCSW Clinical Social Worker [facility] [phone]

## 2012-04-29 NOTE — Progress Notes (Signed)
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 He remains cancer free. He sees Dr. Hezzie Bump at [facility] in late March. He tells me that his disability has been withdrawn. However, he still has left upper upper extremity pain and numbness which has been present since his diagnosis back in 2009. He eventually had cervical spine surgery with Dr. Ophelia Charter, and later Dr. Mikal Plane for stabilization of his cervical spine. He is really just about seeing his dentist, Dr. Malva Limes hear in [place]. He was recently hospitalized in [place] at the behavioral [facility] after finding out that he lost his disability.  Physical examination: Alert and oriented. He is quite anxious. Wt Readings from Last 3 Encounters:  04/29/12 181 lb 6.4 oz (82.283 kg)  07/10/11 186 lb 6.4 oz (84.55 kg)  06/27/11 177 lb 4 oz (80.4 kg)   Temp Readings from Last 3 Encounters:  04/29/12 97.9 F (36.6 C) Oral  06/27/11 97.5 F (36.4 C) Oral  06/27/11 97.5 F (36.4 C) Oral   BP Readings from Last 3 Encounters:  04/29/12 137/83  07/10/11 128/64  06/27/11 128/81   Pulse Readings from Last 3 Encounters:  04/29/12 71  07/10/11 74  06/27/11 74   Head neck examination: There is no palpable lymphadenopathy in the neck. Oral cavity and oropharynx remarkable for mild trismus and moderate xerostomia. Surgical changes are noted along the left oropharynx. No visible or palpable evidence for recurrent disease. Indirect mirror examination confirmatory.   Impression: No evidence for recurrent disease.  Plan: He'll maintain his head neck followup to Dr. Hezzie Bump who will see him later next month. He does not need to return to see me for a followup visit. I will have our social worker meet with him to guide him through his disability denial  appeal.

## 2012-05-09 ENCOUNTER — Other Ambulatory Visit: Payer: Self-pay

## 2012-05-09 ENCOUNTER — Other Ambulatory Visit: Payer: Self-pay | Admitting: *Deleted

## 2012-05-09 MED ORDER — PANTOPRAZOLE SODIUM 40 MG PO TBEC: 40.0000 mg | DELAYED_RELEASE_TABLET | Freq: Every day | ORAL | Status: DC

## 2012-06-11 ENCOUNTER — Ambulatory Visit: Payer: Self-pay | Admitting: Cardiology

## 2012-10-10 ENCOUNTER — Inpatient Hospital Stay (HOSPITAL_COMMUNITY)
Admission: EM | Admit: 2012-10-10 | Discharge: 2012-10-11 | DRG: 313 | Disposition: A | Discharge status: Home / Self Care | Payer: Medicare Other | Attending: Cardiology | Admitting: Cardiology

## 2012-10-10 ENCOUNTER — Emergency Department (HOSPITAL_COMMUNITY): Payer: Medicare Other

## 2012-10-10 ENCOUNTER — Encounter (HOSPITAL_COMMUNITY): Payer: Self-pay

## 2012-10-10 DIAGNOSIS — R079 Chest pain, unspecified: Principal | ICD-10-CM

## 2012-10-10 DIAGNOSIS — K219 Gastro-esophageal reflux disease without esophagitis: Secondary | ICD-10-CM | POA: Diagnosis present

## 2012-10-10 DIAGNOSIS — Z9861 Coronary angioplasty status: Secondary | ICD-10-CM

## 2012-10-10 DIAGNOSIS — Z85819 Personal history of malignant neoplasm of unspecified site of lip, oral cavity, and pharynx: Secondary | ICD-10-CM

## 2012-10-10 DIAGNOSIS — F3289 Other specified depressive episodes: Secondary | ICD-10-CM | POA: Diagnosis present

## 2012-10-10 DIAGNOSIS — F329 Major depressive disorder, single episode, unspecified: Secondary | ICD-10-CM | POA: Diagnosis present

## 2012-10-10 DIAGNOSIS — E43 Unspecified severe protein-calorie malnutrition: Secondary | ICD-10-CM | POA: Insufficient documentation

## 2012-10-10 DIAGNOSIS — Z85828 Personal history of other malignant neoplasm of skin: Secondary | ICD-10-CM

## 2012-10-10 DIAGNOSIS — I252 Old myocardial infarction: Secondary | ICD-10-CM

## 2012-10-10 DIAGNOSIS — E46 Unspecified protein-calorie malnutrition: Secondary | ICD-10-CM | POA: Diagnosis present

## 2012-10-10 DIAGNOSIS — I2510 Atherosclerotic heart disease of native coronary artery without angina pectoris: Secondary | ICD-10-CM | POA: Diagnosis present

## 2012-10-10 DIAGNOSIS — I200 Unstable angina: Secondary | ICD-10-CM | POA: Diagnosis present

## 2012-10-10 DIAGNOSIS — F419 Anxiety disorder, unspecified: Secondary | ICD-10-CM | POA: Diagnosis present

## 2012-10-10 DIAGNOSIS — E039 Hypothyroidism, unspecified: Secondary | ICD-10-CM | POA: Diagnosis present

## 2012-10-10 DIAGNOSIS — IMO0002 Reserved for concepts with insufficient information to code with codable children: Secondary | ICD-10-CM

## 2012-10-10 DIAGNOSIS — Z79899 Other long term (current) drug therapy: Secondary | ICD-10-CM

## 2012-10-10 DIAGNOSIS — E7800 Pure hypercholesterolemia, unspecified: Secondary | ICD-10-CM | POA: Diagnosis present

## 2012-10-10 DIAGNOSIS — Z923 Personal history of irradiation: Secondary | ICD-10-CM

## 2012-10-10 DIAGNOSIS — E785 Hyperlipidemia, unspecified: Secondary | ICD-10-CM | POA: Diagnosis present

## 2012-10-10 DIAGNOSIS — F411 Generalized anxiety disorder: Secondary | ICD-10-CM | POA: Diagnosis present

## 2012-10-10 DIAGNOSIS — Z720 Tobacco use: Secondary | ICD-10-CM | POA: Diagnosis present

## 2012-10-10 DIAGNOSIS — F17200 Nicotine dependence, unspecified, uncomplicated: Secondary | ICD-10-CM | POA: Diagnosis present

## 2012-10-10 HISTORY — DX: Anemia, unspecified: D64.9

## 2012-10-10 HISTORY — DX: Adverse effect of unspecified anesthetic, initial encounter: T41.45XA

## 2012-10-10 HISTORY — DX: Heatstroke and sunstroke, initial encounter: T67.01XA

## 2012-10-10 HISTORY — DX: Hyperlipidemia, unspecified: E78.5

## 2012-10-10 HISTORY — DX: Drug-induced polyneuropathy: G62.0

## 2012-10-10 HISTORY — DX: Depression, unspecified: F32.A

## 2012-10-10 HISTORY — DX: Personal history of other medical treatment: Z92.89

## 2012-10-10 HISTORY — DX: Other complications of anesthesia, initial encounter: T88.59XA

## 2012-10-10 HISTORY — DX: Reserved for concepts with insufficient information to code with codable children: IMO0002

## 2012-10-10 HISTORY — DX: Drug-induced polyneuropathy: T45.1X5A

## 2012-10-10 HISTORY — DX: Major depressive disorder, single episode, unspecified: F32.9

## 2012-10-10 LAB — BASIC METABOLIC PANEL
BUN: 14 mg/dL (ref 6–23)
CO2: 26 mEq/L (ref 19–32)
Chloride: 105 mEq/L (ref 96–112)
Creatinine, Ser: 1.07 mg/dL (ref 0.50–1.35)
Glucose, Bld: 96 mg/dL (ref 70–99)
Potassium: 4.4 mEq/L (ref 3.5–5.1)

## 2012-10-10 LAB — CBC WITH DIFFERENTIAL/PLATELET
HCT: 48 % (ref 39.0–52.0)
Hemoglobin: 17.7 g/dL — ABNORMAL HIGH (ref 13.0–17.0)
Lymphocytes Relative: 28 % (ref 12–46)
Lymphs Abs: 1.9 10*3/uL (ref 0.7–4.0)
MCHC: 36.9 g/dL — ABNORMAL HIGH (ref 30.0–36.0)
Monocytes Absolute: 0.7 10*3/uL (ref 0.1–1.0)
Monocytes Relative: 11 % (ref 3–12)
Neutro Abs: 4 10*3/uL (ref 1.7–7.7)
RBC: 5.21 MIL/uL (ref 4.22–5.81)

## 2012-10-10 LAB — TROPONIN I
Troponin I: <0.3 ng/mL (ref <0.30)
Troponin I: <0.3 ng/mL (ref <0.30)

## 2012-10-10 LAB — HEPARIN LEVEL (UNFRACTIONATED): Heparin Unfractionated: 0.32 IU/mL (ref 0.30–0.70)

## 2012-10-10 LAB — PROTIME-INR: INR: 0.91 (ref 0.00–1.49)

## 2012-10-10 LAB — APTT: aPTT: 30 seconds (ref 24–37)

## 2012-10-10 MED ORDER — METOPROLOL TARTRATE 12.5 MG HALF TABLET
12.5000 mg | ORAL_TABLET | Freq: Four times a day (QID) | ORAL | Status: DC
  Administered 2012-10-10 – 2012-10-11 (×2): 12.5 mg via ORAL
  Filled 2012-10-10 (×7): qty 1

## 2012-10-10 MED ORDER — SODIUM CHLORIDE 0.9 % IV SOLN: 250.0000 mL | INTRAVENOUS | Status: DC | PRN

## 2012-10-10 MED ORDER — LEVOTHYROXINE SODIUM 100 MCG PO TABS
100.0000 ug | ORAL_TABLET | Freq: Every day | ORAL | Status: DC
  Administered 2012-10-11: 100 ug via ORAL
  Filled 2012-10-10 (×3): qty 1

## 2012-10-10 MED ORDER — ATORVASTATIN CALCIUM 40 MG PO TABS
40.0000 mg | ORAL_TABLET | Freq: Every day | ORAL | Status: DC
  Filled 2012-10-10: qty 1

## 2012-10-10 MED ORDER — ACETAMINOPHEN 325 MG PO TABS
650.0000 mg | ORAL_TABLET | Freq: Once | ORAL | Status: AC
  Administered 2012-10-10: 650 mg via ORAL
  Filled 2012-10-10: qty 2

## 2012-10-10 MED ORDER — LORAZEPAM 0.5 MG PO TABS
2.0000 mg | ORAL_TABLET | Freq: Every day | ORAL | Status: DC
  Administered 2012-10-10: 2 mg via ORAL
  Filled 2012-10-10: qty 2

## 2012-10-10 MED ORDER — NITROGLYCERIN 0.4 MG SL SUBL: 0.4000 mg | SUBLINGUAL_TABLET | SUBLINGUAL | Status: DC | PRN

## 2012-10-10 MED ORDER — TRAZODONE HCL 150 MG PO TABS
150.0000 mg | ORAL_TABLET | Freq: Every day | ORAL | Status: DC
  Administered 2012-10-10: 150 mg via ORAL
  Filled 2012-10-10 (×2): qty 1

## 2012-10-10 MED ORDER — NITROGLYCERIN IN D5W 200-5 MCG/ML-% IV SOLN
5.0000 ug/min | INTRAVENOUS | Status: DC
  Administered 2012-10-10: 5 ug/min via INTRAVENOUS
  Filled 2012-10-10: qty 250

## 2012-10-10 MED ORDER — PANTOPRAZOLE SODIUM 40 MG PO TBEC
40.0000 mg | DELAYED_RELEASE_TABLET | Freq: Every day | ORAL | Status: DC
  Administered 2012-10-10 – 2012-10-11 (×2): 40 mg via ORAL
  Filled 2012-10-10 (×2): qty 1

## 2012-10-10 MED ORDER — IOHEXOL 350 MG/ML SOLN
100.0000 mL | Freq: Once | INTRAVENOUS | Status: AC | PRN
  Administered 2012-10-10: 60 mL via INTRAVENOUS

## 2012-10-10 MED ORDER — NITROGLYCERIN 2 % TD OINT
1.0000 [in_us] | TOPICAL_OINTMENT | Freq: Four times a day (QID) | TRANSDERMAL | Status: DC
  Administered 2012-10-10: 1 [in_us] via TOPICAL
  Filled 2012-10-10: qty 1

## 2012-10-10 MED ORDER — MORPHINE SULFATE 4 MG/ML IJ SOLN
4.0000 mg | Freq: Once | INTRAMUSCULAR | Status: AC
Start: 2012-10-10 — End: 2012-10-10
  Administered 2012-10-10: 4 mg via INTRAVENOUS
  Filled 2012-10-10: qty 1

## 2012-10-10 MED ORDER — HEPARIN BOLUS VIA INFUSION
4000.0000 [IU] | Freq: Once | INTRAVENOUS | Status: AC
  Administered 2012-10-10: 4000 [IU] via INTRAVENOUS

## 2012-10-10 MED ORDER — ACETAMINOPHEN 325 MG PO TABS: 650.0000 mg | ORAL_TABLET | ORAL | Status: DC | PRN

## 2012-10-10 MED ORDER — SODIUM CHLORIDE 0.9 % IJ SOLN
3.0000 mL | Freq: Two times a day (BID) | INTRAMUSCULAR | Status: DC
  Administered 2012-10-10 – 2012-10-11 (×2): 3 mL via INTRAVENOUS

## 2012-10-10 MED ORDER — HEPARIN (PORCINE) IN NACL 100-0.45 UNIT/ML-% IJ SOLN
1100.0000 [IU]/h | INTRAMUSCULAR | Status: DC
  Administered 2012-10-10: 850 [IU]/h via INTRAVENOUS
  Administered 2012-10-11: 1100 [IU]/h via INTRAVENOUS
  Administered 2012-10-11: 900 [IU]/h via INTRAVENOUS
  Filled 2012-10-10 (×2): qty 250

## 2012-10-10 MED ORDER — MORPHINE SULFATE 2 MG/ML IJ SOLN
2.0000 mg | INTRAMUSCULAR | Status: DC | PRN
  Administered 2012-10-11: 2 mg via INTRAVENOUS
  Filled 2012-10-10: qty 2
  Filled 2012-10-10: qty 1

## 2012-10-10 MED ORDER — ALPRAZOLAM 0.5 MG PO TABS
0.5000 mg | ORAL_TABLET | Freq: Four times a day (QID) | ORAL | Status: DC | PRN
  Administered 2012-10-10 – 2012-10-11 (×2): 0.5 mg via ORAL
  Filled 2012-10-10 (×2): qty 1

## 2012-10-10 MED ORDER — SODIUM CHLORIDE 0.9 % IV BOLUS (SEPSIS)
1000.0000 mL | Freq: Once | INTRAVENOUS | Status: AC
  Administered 2012-10-10: 1000 mL via INTRAVENOUS

## 2012-10-10 MED ORDER — SODIUM CHLORIDE 0.9 % IJ SOLN: 3.0000 mL | INTRAMUSCULAR | Status: DC | PRN

## 2012-10-10 NOTE — ED Notes (Signed)
Ativan ordered in orders pt refused he reports that he cannot take ativan but he has his xanax in his pockets.  Ativan returned to stock and the pt was told he could not take his xanax

## 2012-10-10 NOTE — ED Notes (Signed)
The pt has no pressure at present asking for water

## 2012-10-10 NOTE — ED Provider Notes (Signed)
CSN: [id number]     Arrival date & time 10/10/12  1309 History     First MD Initiated Contact with Patient 10/10/12 1313     Chief Complaint  Patient presents with  . Chest Pain   (Consider location/radiation/quality/duration/timing/severity/associated sxs/prior Treatment) Patient is a 51 y.o. male presenting with chest pain.  Chest Pain  Pt with history of MI and throat cancer reports onset of left sided chest pressure last night, associated with diaphoresis and general ill feeling. Reports he has some mild SOB and pain is worse with deep breath. No fever or cough. Recently finished chemo and radiation for his cancer, had followup at his doctor in [place] recently and reports unexplained 16lb weight loss in the last 4-6 weeks.   Past Medical History  Diagnosis Date  . Hypothyroidism   . 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 Myocardial infarction     x2  . S/P coronary angioplasty 2004    [facility]  . Degenerative joint disease of cervical spine   . GERD (gastroesophageal reflux disease)   . Coronary artery disease   . Basal cell carcinoma of skin   . Melanoma   . Tonsillar cancer     Squamous cell, on the left, stage IV  . History of radiation therapy 10/21/07-12/11/07    left tonsil   Past Surgical History  Procedure Laterality Date  . Tonsillectomy  2009    left Dr. Gordy Levan [facility]  . Posterior fusion cervical spine  September 2012    C5-6  . Pharyngectomy  2009    left Dr. Gordy Levan  . Partial glossectomy      left Dr. Gordy Levan  . Neck dissection      left Dr. Gordy Levan  . Multiple tooth extractions  2009  . Skin cancer excision      Multiple squamous and basal cell carcinomas  . Melanoma excision      Right forearm  . Cervical discectomy  2010    C 5-6  . Esophagogastroduodenoscopy  03/26/2011    Procedure: ESOPHAGOGASTRODUODENOSCOPY (EGD);  Surgeon: Iva Boop, MD;  Location:  [facility];  Service: Endoscopy;  Laterality: N/A;  . Balloon dilation  03/26/2011    Procedure: BALLOON DILATION;  Surgeon: Iva Boop, MD;  Location: [facility];  Service: Endoscopy;  Laterality: N/A;  . Colonoscopy  03/26/2011    Procedure: COLONOSCOPY;  Surgeon: Iva Boop, MD;  Location: [facility];  Service: Endoscopy;  Laterality: N/A;   Family History  Problem Relation Age of Onset  . Colon cancer Maternal Uncle   . Heart disease Maternal Grandmother   . Cirrhosis Maternal Grandfather     alcoholic  . Cirrhosis Maternal Uncle     alcoholic  . Hypothyroidism Mother   . Goiter Mother    History  Substance Use Topics  . Smoking status: Former Smoker -- 1.50 packs/day for 20 years    Types: Cigarettes    Quit date: 08/11/2007  . Smokeless tobacco: Never Used  . Alcohol Use: No    Review of Systems  Cardiovascular: Positive for chest pain.   All other systems reviewed and are negative except as noted in HPI.    Allergies  Benadryl; Ondansetron; Promethazine hcl; and Propofol  Home Medications   Current Outpatient Rx  Name  Route  Sig  Dispense  Refill  . levothyroxine (SYNTHROID, LEVOTHROID)  100 MCG tablet   Oral   Take 100 mcg by mouth daily.           Marland Kitchen LORazepam (ATIVAN) 1 MG tablet   Oral   Take 2 mg by mouth at bedtime. Take 2 tablets at bedtime         . pantoprazole (PROTONIX) 40 MG tablet   Oral   Take 1 tablet (40 mg total) by mouth daily.   30 tablet   3   . traZODone (DESYREL) 150 MG tablet   Oral   Take 150 mg by mouth at bedtime.           BP 169/107  Pulse 74  Temp(Src) 98.1 F (36.7 C) (Oral)  Resp 16  Ht 6\' 3"  (1.905 m)  Wt 158 lb (71.668 kg)  BMI 19.75 kg/m2  SpO2 99% Physical Exam  Nursing note and vitals reviewed. Constitutional: He is oriented to person, place, and time. He appears well-developed and well-nourished.  HENT:  Head: Normocephalic and atraumatic.  Eyes: EOM are normal. Pupils are equal, round,  and reactive to light.  Neck: Normal range of motion. Neck supple.  Cardiovascular: Normal rate, normal heart sounds and intact distal pulses.   Pulmonary/Chest: Effort normal and breath sounds normal. No respiratory distress. He has no wheezes. He has no rales. He exhibits tenderness (mild, left anterior).  Abdominal: Bowel sounds are normal. He exhibits no distension. There is no tenderness.  Musculoskeletal: Normal range of motion. He exhibits no edema and no tenderness.  Neurological: He is alert and oriented to person, place, and time. He has normal strength. No cranial nerve deficit or sensory deficit.  Skin: Skin is warm and dry. No rash noted.  Psychiatric: He has a normal mood and affect.    ED Course   Procedures (including critical care time)  Labs Reviewed  CBC WITH DIFFERENTIAL - Abnormal; Notable for the following:    Hemoglobin 17.7 (*)    MCHC 36.9 (*)    Platelets 138 (*)    All other components within normal limits  BASIC METABOLIC PANEL - Abnormal; Notable for the following:    GFR calc non Af Amer 80 (*)    All other components within normal limits  TROPONIN I  PROTIME-INR  APTT   Ct Angio Chest Pe W/cm &/or Wo Cm  10/10/2012   *RADIOLOGY REPORT*  Clinical Data: Chest pain  CT ANGIOGRAPHY CHEST  Technique:  Multidetector CT imaging of the chest using the standard protocol during bolus administration of intravenous contrast. Multiplanar reconstructed images including MIPs were obtained and reviewed to evaluate the vascular anatomy.  Contrast: OMNIPAQUE IOHEXOL 350 MG/ML SOLN  Comparison: 06/24/2011  Findings: There is no filling defect within the opacified pulmonary arteries to suggest the presence of an acute pulmonary embolus.  No thoracic aortic aneurysm.  No axillary, mediastinal, or hilar lymphadenopathy.  Heart size is normal.  There is no pericardial or pleural effusion.  Biapical pleuroparenchymal scarring is stable.  No evidence for pulmonary edema.  No  focal airspace consolidation.  Bone windows reveal no worrisome lytic or sclerotic osseous lesions.  IMPRESSION: No CT evidence for acute pulmonary embolus.  No acute findings to explain the patient's history of chest pain.   Original Report Authenticated By: Kennith Center, M.D.   1. Chest pain     MDM   Date: 10/10/2012  Rate: 76  Rhythm: normal sinus rhythm  QRS Axis: normal  Intervals: normal  ST/T Wave abnormalities: nonspecific T  wave changes  Conduction Disutrbances:none  Narrative Interpretation:   Old EKG Reviewed: unchanged  Pt states unable to take SL NTG due to lack of saliva production stemming from his prior surgery/radiation/chemo. No Nitro spray available in the ED. Will give IV morphine for pain control and place NTG paste.   3:06 PM CP resolved, patient feeling better. Now with mild headache. Labs and imaging reviewed. Will admit to [place] for further eval.    Leonette Most B. Bernette Mayers, MD 10/10/12 [phone]

## 2012-10-10 NOTE — ED Notes (Signed)
No bed assignment yet 

## 2012-10-10 NOTE — ED Notes (Signed)
The pt does not want his protonix he reports that he took all his meds this am and he does not need any now

## 2012-10-10 NOTE — ED Notes (Signed)
Admitting pa at the bedside 

## 2012-10-10 NOTE — ED Notes (Signed)
The pt   Reports that his chest pain has decreased the pressure is less

## 2012-10-10 NOTE — ED Notes (Signed)
Pt here by [facility] for CP, pt has hx of 2 MI and lymphoma, pt reports recent 16 pound weight loss within last month, unexplainable. Pt sts chest pain started yesterday and continues to just FEEL BAD, sts pain increases with deep breath and feels like pressure.

## 2012-10-10 NOTE — ED Notes (Signed)
The pt states his pressure is getting more intense tylenol given

## 2012-10-10 NOTE — ED Notes (Signed)
MD at bedside. 

## 2012-10-10 NOTE — ED Notes (Signed)
Family at bedside. 

## 2012-10-10 NOTE — Progress Notes (Signed)
ANTICOAGULATION CONSULT NOTE - Initial Consult  Pharmacy Consult for heparin Indication: chest pain/ACS  Allergies  Allergen Reactions  . Benadryl (Diphenhydramine Hcl)     "crazy"  . Ondansetron Other (See Comments)    Headaches  . Promethazine Hcl Nausea And Vomiting  . Propofol     "violent"    Patient Measurements: Height: 6\' 3"  (190.5 cm) Weight: 158 lb (71.668 kg) IBW/kg (Calculated) : 84.5 Heparin Dosing Weight: 72kg  Vital Signs: Temp: 98.1 F (36.7 C) (08/01 1320) Temp src: Oral (08/01 1320) BP: 135/84 mmHg (08/01 1618) Pulse Rate: 74 (08/01 1618)  Labs:  Recent Labs  10/10/12 1340  HGB 17.7*  HCT 48.0  PLT 138*  APTT 30  LABPROT 12.1  INR 0.91  CREATININE 1.07  TROPONINI <0.30    Estimated Creatinine Clearance: 84.7 ml/min (by C-G formula based on Cr of 1.07).   Medical History: Past Medical History  Diagnosis Date  . Hypothyroidism   . 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 Myocardial infarction     2004, 2013 and 1 more.  . S/P coronary angioplasty 2004    [facility]  . Degenerative joint disease of cervical spine   . GERD (gastroesophageal reflux disease)   . Coronary artery disease   . Basal cell carcinoma of skin   . Melanoma   . Tonsillar cancer     Squamous cell, on the left, stage IV  . History of radiation therapy 10/21/07-12/11/07    left tonsil  . Tobacco use   . Dyslipidemia (high LDL; low HDL)     Medications:  See med rec  Assessment: Patient is a 52 y.o M with hx of CAD presented to [facility] ED today with c/o CP.  To start heparin for r/o ACS.  Goal of Therapy:  Heparin level 0.3-0.7 units/ml Monitor platelets by anticoagulation protocol: Yes   Plan:  1) heparin 4000 units IV bolus x1, then drip at 850 units/hr 2) check 6 hour heparin level  Landy Dunnavant P 10/10/2012,5:05 PM

## 2012-10-10 NOTE — Consult Note (Signed)
CARDIOLOGY CONSULT NOTE   Patient ID: Bobby Floyd MRN: [id number] DOB/AGE: 1960/07/25 52 y.o.  Admit date: 10/10/2012  Primary Physician   Jerold Coombe Primary Cardiologist   Was TS Reason for Consultation   Chest pain  Bobby Floyd is a 52 y.o. male with a history of CAD.  He had PCI in 2013. He has not had chest pain since then until last pm. He had onset of SSCP, his usual angina. Pressure and 8/10. It was associated with SOB, nausea and some diaphoresis. The pain continued all night. 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 He says the pain is worse with deep inspiration. He did not seek help because he did not want to go to [facility]. Today, he called EMS and got them to take him to [facility]. At [facility], he received Tylenol, morphine, NTG paste and is being started on IV NTG. With the meds, the pain decreased to a small amount but is increasing now and is now a 7/10. He is upset and anxious about the situation.    Past Medical History  Diagnosis Date  . Hypothyroidism   . 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 Myocardial infarction     2004, 2013 and 1 more.  . S/P coronary angioplasty 2004    [facility]  . Degenerative joint disease of cervical spine   . GERD (gastroesophageal reflux disease)   . Coronary artery disease   . Basal cell carcinoma of skin   . Melanoma   . Tonsillar cancer     Squamous cell, on the left, stage IV  . History of radiation therapy 10/21/07-12/11/07    left tonsil  . Tobacco use   . Dyslipidemia (high LDL; low HDL)      Past Surgical History  Procedure Laterality Date  . Tonsillectomy  2009    left Dr. Gordy Levan [facility]  . Posterior fusion cervical spine  September 2012    C5-6  . Pharyngectomy  2009    left Dr. Gordy Levan  . Partial glossectomy      left Dr. Gordy Levan  . Neck dissection      left Dr. Gordy Levan  .  Multiple tooth extractions  2009  . Skin cancer excision      Multiple squamous and basal cell carcinomas  . Melanoma excision      Right forearm  . Cervical discectomy  2010    C 5-6  . Esophagogastroduodenoscopy  03/26/2011    Procedure: ESOPHAGOGASTRODUODENOSCOPY (EGD);  Surgeon: Iva Boop, MD;  Location: [facility];  Service: Endoscopy;  Laterality: N/A;  . Balloon dilation  03/26/2011    Procedure: BALLOON DILATION;  Surgeon: Iva Boop, MD;  Location: [facility];  Service: Endoscopy;  Laterality: N/A;  . Colonoscopy  03/26/2011    Procedure: COLONOSCOPY;  Surgeon: Iva Boop, MD;  Location: [facility]COPY;  Service: Endoscopy;  Laterality: N/A;  . Coronary angioplasty  06/2011    LAD 40%, OM1 60%, small OM 2 thrombotic treated with PTCA to 20%, RCA occluded but recanalized, EF 50%    Allergies  Allergen Reactions  . Benadryl (Diphenhydramine Hcl)     "crazy"  . Ondansetron Other (See Comments)    Headaches  . Promethazine Hcl Nausea And Vomiting  . Propofol     "violent"    I have  reviewed the patient's current medications . nitroGLYCERIN  1 inch Topical Q6H   Prior to Admission medications   Medication Sig Start Date End Date Taking? Authorizing Provider  levothyroxine (SYNTHROID, LEVOTHROID) 100 MCG tablet Take 100 mcg by mouth daily.     Yes Historical Provider, MD  LORazepam (ATIVAN) 1 MG tablet Take 2 mg by mouth at bedtime. Take 2 tablets at bedtime   Yes Historical Provider, MD  pantoprazole (PROTONIX) 40 MG tablet Take 1 tablet (40 mg total) by mouth daily. 05/09/12  Yes Herby Abraham, MD  traZODone (DESYREL) 150 MG tablet Take 150 mg by mouth at bedtime.  04/17/12  Yes Historical Provider, MD     History   Social History  . Marital Status: Divorced    Spouse Name: N/A    Number of Children: N/A  . Years of Education: N/A   Occupational History  . Disabled    Social History Main Topics  . Smoking status: Former Smoker -- 1.50 packs/day for  20 years    Types: Cigarettes    Quit date: 08/11/2007  . Smokeless tobacco: Never Used  . Alcohol Use: No  . Drug Use: No  . Sexually Active: Not on file   Other Topics Concern  . Not on file   Social History Narrative   Former Nutritional therapist, he is disabled. Pt lives alone, has a fiance. He just bought a house.    Family Status  Relation Status Death Age  . Mother Alive    Family History  Problem Relation Age of Onset  . Colon cancer Maternal Uncle   . Heart disease Maternal Grandmother   . Cirrhosis Maternal Grandfather     alcoholic  . Cirrhosis Maternal Uncle     alcoholic  . Hypothyroidism Mother   . Goiter Mother      ROS:  Full 14 point review of systems complete and found to be negative unless listed above.  Physical Exam: Blood pressure 135/84, pulse 74, temperature 98.1 F (36.7 C), temperature source Oral, resp. rate 18, height 6\' 3"  (1.905 m), weight 158 lb (71.668 kg), SpO2 99.00%.  General: Well developed, well nourished, male in some distress Head: Eyes PERRLA, No xanthomas.   Normocephalic and atraumatic, oropharynx without edema or exudate. Dentition: good Lungs: Few rales but generally clear Heart: HRRR S1 S2, no rub/gallop, no significant murmur. pulses are 2+ all 4 extrem.   Neck: No carotid bruits. No lymphadenopathy.  JVD not elevated. Abdomen: Bowel sounds present, abdomen soft and non-tender without masses or hernias noted. Msk:  No spine or cva tenderness. No weakness, no joint deformities or effusions. Extremities: No clubbing or cyanosis. No edema.  Neuro: Alert and oriented X 3. No focal deficits noted. Psych:  Anxious affect, responds appropriately Skin: No rashes or lesions noted.  Labs:  Lab Results  Component Value Date   WBC 6.7 10/10/2012   HGB 17.7* 10/10/2012   HCT 48.0 10/10/2012   MCV 92.1 10/10/2012   PLT 138* 10/10/2012    Recent Labs  10/10/12 1340  INR 0.91     Recent Labs Lab 10/10/12 1340  NA 139  K 4.4  CL 105  CO2 26    BUN 14  CREATININE 1.07  CALCIUM 9.8  GLUCOSE 96    Recent Labs  10/10/12 1340  TROPONINI <0.30   ECG: SR, no acute ischemic changes, rate 76   Radiology:  Ct Angio Chest Pe W/cm &/or Wo Cm 10/10/2012   *RADIOLOGY REPORT*  Clinical Data: Chest pain  CT ANGIOGRAPHY CHEST  Technique:  Multidetector CT imaging of the chest using the standard protocol during bolus administration of intravenous contrast. Multiplanar reconstructed images including MIPs were obtained and reviewed to evaluate the vascular anatomy.  Contrast: OMNIPAQUE IOHEXOL 350 MG/ML SOLN  Comparison: 06/24/2011  Findings: There is no filling defect within the opacified pulmonary arteries to suggest the presence of an acute pulmonary embolus.  No thoracic aortic aneurysm.  No axillary, mediastinal, or hilar lymphadenopathy.  Heart size is normal.  There is no pericardial or pleural effusion.  Biapical pleuroparenchymal scarring is stable.  No evidence for pulmonary edema.  No focal airspace consolidation.  Bone windows reveal no worrisome lytic or sclerotic osseous lesions.  IMPRESSION: No CT evidence for acute pulmonary embolus.  No acute findings to explain the patient's history of chest pain.   Original Report Authenticated By: Kennith Center, M.D.    ASSESSMENT AND PLAN:   The patient was seen today by Dr Shirlee Latch, the patient evaluated and the data reviewed.  Bobby Floyd is a 52 year old male with a history of CAD. He had onset of chest pain last pm at rest, his usual angina with associated symptoms. He came to the ER today and his pain has responded to nitrates and morphine.   Principal Problem:   Angina pectoris, unstable - admit, r/o MI, continue nitrates and narcotics for pain. Use heparin, ASA. ECG not acute, MD advise on cath. Active Problems:   GERD (gastroesophageal reflux disease) - continue Rx   Hypothyroidism - ck TSH and continue meds   Anxiety - home meds and PRN Rx   Hypercholesterolemia - ck profile and  probably needs statin   Tobacco use - says he has started smoking again. Cessation discussed and encouraged.   SignedTheodore Demark, PA-C 10/10/2012 4:51 PM Beeper [phone]  Co-Sign MD  Patient seen with PA, agree with the above note.  He has a prior history of CAD.   Patient has had prolonged chest pain since yesterday evening, it has never stopped.  He says it feels like prior MI pain.  However, his ECG is normal and his cardiac enzymes are normal after hours of pain.  I will repeat cardiac enzymes now.  I will put him on IV heparin, IV NTG, ASA, metoprolol 12.5 mg every 6 hours, atorvastatin 80 mg daily.  Will decide on further evaluation based on cardiac enzymes and symptoms, he will need stress test versus cath depending on his clinical course.  He needs to quit smoking.   Marca Ancona 10/10/2012 5:26 PM

## 2012-10-10 NOTE — Progress Notes (Signed)
ANTICOAGULATION CONSULT NOTE - Follow Up Consult  Pharmacy Consult for heparin Indication: chest pain/ACS  Labs:  Recent Labs  10/10/12 1340 10/10/12 1722 10/10/12 2217  HGB 17.7*  --   --   HCT 48.0  --   --   PLT 138*  --   --   APTT 30  --   --   LABPROT 12.1  --   --   INR 0.91  --   --   HEPARINUNFRC  --   --  0.32  CREATININE 1.07  --   --   TROPONINI <0.30 <0.30 <0.30    Assessment: 52yo male therapeutic on heparin with initial dosing for CP though at low end of goal.  Goal of Therapy:  Heparin level 0.3-0.7 units/ml   Plan:  Will increase heparin gtt slightly to 900 units/hr and confirm stable with am labs.  Vernard Gambles, PharmD, BCPS  10/10/2012,11:49 PM

## 2012-10-10 NOTE — ED Notes (Signed)
Nitro drip started.

## 2012-10-10 NOTE — ED Notes (Signed)
Still waiting for a bed assignment.

## 2012-10-10 NOTE — ED Notes (Signed)
Heparin drip and bolus started 

## 2012-10-10 NOTE — Progress Notes (Signed)
Pt having asymptomatic brachycardia noted lowest at 47, BP 87/56, on nitro drip at 31mcg/hr. . On call Cardiologist made aware. Nitro drip placed on hold to observe Pt.

## 2012-10-10 NOTE — ED Notes (Signed)
Report  Called to [phone]

## 2012-10-11 ENCOUNTER — Encounter (HOSPITAL_COMMUNITY): Payer: Self-pay | Admitting: Physician Assistant

## 2012-10-11 ENCOUNTER — Other Ambulatory Visit: Payer: Self-pay | Admitting: Physician Assistant

## 2012-10-11 DIAGNOSIS — R079 Chest pain, unspecified: Secondary | ICD-10-CM

## 2012-10-11 DIAGNOSIS — E43 Unspecified severe protein-calorie malnutrition: Secondary | ICD-10-CM | POA: Insufficient documentation

## 2012-10-11 DIAGNOSIS — I2510 Atherosclerotic heart disease of native coronary artery without angina pectoris: Secondary | ICD-10-CM

## 2012-10-11 LAB — CBC
MCH: 33.3 pg (ref 26.0–34.0)
Platelets: 122 10*3/uL — ABNORMAL LOW (ref 150–400)
RBC: 4.77 MIL/uL (ref 4.22–5.81)
RDW: 12.6 % (ref 11.5–15.5)
WBC: 5.5 10*3/uL (ref 4.0–10.5)

## 2012-10-11 LAB — COMPREHENSIVE METABOLIC PANEL
Albumin: 3.5 g/dL (ref 3.5–5.2)
Alkaline Phosphatase: 63 U/L (ref 39–117)
BUN: 13 mg/dL (ref 6–23)
Calcium: 9.1 mg/dL (ref 8.4–10.5)
Creatinine, Ser: 1.02 mg/dL (ref 0.50–1.35)
GFR calc Af Amer: >90 mL/min (ref >90)
Glucose, Bld: 88 mg/dL (ref 70–99)
Potassium: 3.8 mEq/L (ref 3.5–5.1)
Total Protein: 5.7 g/dL — ABNORMAL LOW (ref 6.0–8.3)

## 2012-10-11 LAB — TSH: TSH: 2.317 u[IU]/mL (ref 0.350–4.500)

## 2012-10-11 LAB — LIPID PANEL
Cholesterol: 146 mg/dL (ref 0–200)
HDL: 47 mg/dL (ref >39)
Total CHOL/HDL Ratio: 3.1 RATIO
Triglycerides: 63 mg/dL (ref <150)

## 2012-10-11 MED ORDER — BOOST / RESOURCE BREEZE PO LIQD
1.0000 | Freq: Two times a day (BID) | ORAL | Status: DC
Start: 2012-10-11 — End: 2012-10-11

## 2012-10-11 MED ORDER — PANTOPRAZOLE SODIUM 40 MG PO TBEC: 40.0000 mg | DELAYED_RELEASE_TABLET | Freq: Two times a day (BID) | ORAL | Status: DC

## 2012-10-11 MED ORDER — ATORVASTATIN CALCIUM 40 MG PO TABS: 40.0000 mg | ORAL_TABLET | Freq: Every day | ORAL | Status: DC

## 2012-10-11 MED ORDER — HEPARIN BOLUS VIA INFUSION
2000.0000 [IU] | Freq: Once | INTRAVENOUS | Status: AC
  Administered 2012-10-11: 2000 [IU] via INTRAVENOUS
  Filled 2012-10-11: qty 2000

## 2012-10-11 MED ORDER — NITROGLYCERIN 0.4 MG SL SUBL: 0.4000 mg | SUBLINGUAL_TABLET | SUBLINGUAL | Status: DC | PRN

## 2012-10-11 MED ORDER — METOPROLOL TARTRATE 25 MG PO TABS: 12.5000 mg | ORAL_TABLET | Freq: Two times a day (BID) | ORAL | Status: DC

## 2012-10-11 NOTE — Discharge Summary (Signed)
Discharge Summary   Patient ID: Bobby Floyd, MRN: [id number], DOB/AGE: Apr 30, 1960 52 y.o.  Admit date: 10/10/2012 Discharge date: 10/11/2012   Primary Care Physician:  Elizebeth Koller   Primary Cardiologist:  Dr.  Shawnie Pons => Dr. Marca Ancona   Reason for Admission:   Chest Pain   Primary Discharge Diagnoses:  1. Chest Pain - no objective evidence of ischemia 2. CAD 3. GERD 4. Tonsillar CA 5. Protein Calorie Malnutrition   Wt Readings from Last 3 Encounters:  10/11/12 164 lb 6.4 oz (74.571 kg)  04/29/12 181 lb 6.4 oz (82.283 kg)  07/10/11 186 lb 6.4 oz (84.55 kg)    Secondary Discharge Diagnoses:   Past Medical History  Diagnosis Date  . Hypothyroidism   . Degenerative joint disease of cervical spine   . GERD (gastroesophageal reflux disease)   . Coronary artery disease     a. NSTEMI 4/13 => LHC:  oLAD 40, then 40, mOM1 50-60, mOM2 occluded (culprit), AVCFX 30, RCA occluded, EF 50% => PCI:  BMS to OM2  . Dyslipidemia (high LDL; low HDL)   . Chemotherapy-induced neuropathy   . Myocardial infarction 2004; 2007; 2013  . Anemia   . History of blood transfusion 2009    "after throat cancer OR" (10/10/2012)  . 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 Depression   . Basal cell carcinoma of skin   . Melanoma     "right hand or forearm" (10/10/2012)  . Tonsillar cancer     Squamous cell, on the left, stage IV; radiation therapy 10/21/07-12/11/07  . Squamous cell carcinoma     "forearms, hands, head, nose" (10/10/2012)      Allergies:    Allergies  Allergen Reactions  . Benadryl (Diphenhydramine Hcl)     "crazy"  . Ondansetron Other (See Comments)    Headaches  . Promethazine Hcl Nausea And Vomiting  . Propofol     "violent"      Procedures Performed This Admission:    None   Hospital Course:  Bobby Floyd is a 52 y.o. male with a hx of CAD, s/p non-STEMI in 06/2011 treated with a bare-metal stent to the OM2,  known occluded RCA, tonsillar CA, GERD, HL.  He presented to the emergency room via EMS with complaints of chest pressure which had continued throughout the night prior to presentation. He was initially treated with Tylenol, morphine, nitro paste that was transitioned over to IV nitroglycerin. He had some relief with this regimen.  Despite hours of chest discomfort, the patient ECG remained normal and all his cardiac markers were normal. Chest CT was negative for pulmonary embolism.  He was evaluated by Dr. Ladona Ridgel on the morning of discharge.  His chest pain has resolved. The patient's Protonix will be increased at discharge to twice daily dosing to cover for possibility of gastrointestinal etiology. He'll also be set up for outpatient stress testing.   Discharge Vitals:   Blood pressure 126/90, pulse 83, temperature 97.7 F (36.5 C), temperature source Oral, resp. rate 18, height 6\' 3"  (1.905 m), weight 164 lb 6.4 oz (74.571 kg), SpO2 99.00%.   Labs:   Recent Labs  10/10/12 1340 10/11/12 0330  WBC 6.7 5.5  HGB 17.7* 15.9  HCT 48.0 43.8  MCV 92.1 91.8  PLT 138* 122*     Recent Labs  10/10/12 1340 10/11/12 0330  NA 139 138  K 4.4 3.8  CL 105 106  CO2 26 23  BUN 14 13  CREATININE 1.07 1.02  CALCIUM 9.8 9.1  PROT  --  5.7*  BILITOT  --  0.8  ALKPHOS  --  63  ALT  --  7  AST  --  13     Recent Labs  10/10/12 1340 10/10/12 1722 10/10/12 2217 10/11/12 0330  TROPONINI <0.30 <0.30 <0.30 <0.30    Lab Results  Component Value Date   CHOL 146 10/11/2012   HDL 47 10/11/2012   LDLCALC 86 10/11/2012   TRIG 63 10/11/2012     Recent Labs  10/10/12 1340  INR 0.91     Diagnostic Procedures and Studies:  Ct Angio Chest Pe W/cm &/or Wo Cm  10/10/2012     IMPRESSION: No CT evidence for acute pulmonary embolus.  No acute findings to explain the patient's history of chest pain.   Original Report Authenticated By: Kennith Center, M.D.     Disposition:   Pt is being discharged home  today in good condition.  Follow-up Plans & Appointments      Follow-up Information   Follow up with [facility] In 3 days. (the office will call you to arrange a stress test this week)    Contact information:   [address] [place] [address]       Follow up with Marca Ancona, MD In 2 weeks. (the office will call to arrange a follow up after your stress test with Dr. Shirlee Latch or the PA)    Contact information:   [address] [place] [address] [phone]       Call WHITE,HANS, PA-C. (to arrange follow up)    Contact information:   [address] [place] [phone]       Discharge Medications    Medication List         atorvastatin 40 MG tablet  Commonly known as:  LIPITOR  Take 1 tablet (40 mg total) by mouth daily at 6 PM.     levothyroxine 100 MCG tablet  Commonly known as:  SYNTHROID, LEVOTHROID  Take 100 mcg by mouth daily.     LORazepam 1 MG tablet  Commonly known as:  ATIVAN  Take 2 mg by mouth at bedtime. Take 2 tablets at bedtime     metoprolol tartrate 25 MG tablet  Commonly known as:  LOPRESSOR  Take 0.5 tablets (12.5 mg total) by mouth 2 (two) times daily.     nitroGLYCERIN 0.4 MG SL tablet  Commonly known as:  NITROSTAT  Place 1 tablet (0.4 mg total) under the tongue every 5 (five) minutes x 3 doses as needed for chest pain.     pantoprazole 40 MG tablet  Commonly known as:  PROTONIX  Take 1 tablet (40 mg total) by mouth 2 (two) times daily.     traZODone 150 MG tablet  Commonly known as:  DESYREL  Take 150 mg by mouth at bedtime.         Outstanding Labs/Studies  1. Outpatient Lexiscan Myoview Check Lipids and LFTs in 6 weeks.    Duration of Discharge Encounter: Greater than 30 minutes including physician and PA time.  Signed, Tereso Newcomer, PA-C   10/11/2012 1:59 PM    [address] [place], [place] [address] [phone]

## 2012-10-11 NOTE — Progress Notes (Signed)
ANTICOAGULATION CONSULT NOTE - Follow Up Consult  Pharmacy Consult for heparin Indication: chest pain/ACS  Labs:  Recent Labs  10/10/12 1340 10/10/12 1722 10/10/12 2217 10/11/12 0330  HGB 17.7*  --   --   --   HCT 48.0  --   --   --   PLT 138*  --   --   --   APTT 30  --   --   --   LABPROT 12.1  --   --   --   INR 0.91  --   --   --   HEPARINUNFRC  --   --  0.32 0.16*  CREATININE 1.07  --   --   --   TROPONINI <0.30 <0.30 <0.30  --     Assessment: 52yo male now subtherapeutic on heparin despite small rate increase with level at low end of goal.  Goal of Therapy:  Heparin level 0.3-0.7 units/ml   Plan:  Will rebolus with heparin 2000 units and increase gtt by 3 units/kg/hr to 1100 units/hr and check level in 6hr.  Vernard Gambles, PharmD, BCPS  10/11/2012,5:06 AM

## 2012-10-11 NOTE — Progress Notes (Signed)
Patient ID: Bobby Floyd, male   DOB: 07/31/1960, 52 y.o.   MRN: [id number] Subjective:  Chest pain has resolved.   Objective:  Vital Signs in the last 24 hours: Temp:  [97.5 F (36.4 C)-98.5 F (36.9 C)] 97.7 F (36.5 C) (08/02 1100) Pulse Rate:  [53-77] 63 (08/02 1100) Resp:  [12-22] 18 (08/02 1100) BP: (84-169)/(56-107) 124/84 mmHg (08/02 1100) SpO2:  [94 %-100 %] 98 % (08/02 1100) Weight:  [158 lb (71.668 kg)-164 lb 6.4 oz (74.571 kg)] 164 lb 6.4 oz (74.571 kg) (08/02 0500)  Intake/Output from previous day: 08/01 0701 - 08/02 0700 In: 57.1 [I.V.:57.1] Out: -  Intake/Output from this shift: Total I/O In: 47 [I.V.:47] Out: 300 [Urine:300]  Physical Exam: stable appearing 52 yo man, NAD HEENT: Unremarkable Neck:  7 cm JVD, no thyromegally Back:  No CVA tenderness Lungs:  Clear with no wheezes HEART:  Regular rate rhythm, no murmurs, no rubs, no clicks Abd:  Flat, positive bowel sounds, no organomegally, no rebound, no guarding Ext:  2 plus pulses, no edema, no cyanosis, no clubbing Skin:  No rashes no nodules Neuro:  CN II through XII intact, motor grossly intact  Lab Results:  Recent Labs  10/10/12 1340 10/11/12 0330  WBC 6.7 5.5  HGB 17.7* 15.9  PLT 138* 122*    Recent Labs  10/10/12 1340 10/11/12 0330  NA 139 138  K 4.4 3.8  CL 105 106  CO2 26 23  GLUCOSE 96 88  BUN 14 13  CREATININE 1.07 1.02    Recent Labs  10/10/12 2217 10/11/12 0330  TROPONINI <0.30 <0.30   Hepatic Function Panel  Recent Labs  10/11/12 0330  PROT 5.7*  ALBUMIN 3.5  AST 13  ALT 7  ALKPHOS 63  BILITOT 0.8    Recent Labs  10/11/12 0330  CHOL 146   No results found for this basename: PROTIME,  in the last 72 hours  Imaging: Ct Angio Chest Pe W/cm &/or Wo Cm  10/10/2012   *RADIOLOGY REPORT*  Clinical Data: Chest pain  CT ANGIOGRAPHY CHEST  Technique:  Multidetector CT imaging of the chest using the standard protocol during bolus administration of  intravenous contrast. Multiplanar reconstructed images including MIPs were obtained and reviewed to evaluate the vascular anatomy.  Contrast: OMNIPAQUE IOHEXOL 350 MG/ML SOLN  Comparison: 06/24/2011  Findings: There is no filling defect within the opacified pulmonary arteries to suggest the presence of an acute pulmonary embolus.  No thoracic aortic aneurysm.  No axillary, mediastinal, or hilar lymphadenopathy.  Heart size is normal.  There is no pericardial or pleural effusion.  Biapical pleuroparenchymal scarring is stable.  No evidence for pulmonary edema.  No focal airspace consolidation.  Bone windows reveal no worrisome lytic or sclerotic osseous lesions.  IMPRESSION: No CT evidence for acute pulmonary embolus.  No acute findings to explain the patient's history of chest pain.   Original Report Authenticated By: Kennith Center, M.D.    Cardiac Studies: Tele -nsr Assessment/Plan:  1. Chest pain, atypical, resolved with no ecg or enzyme abnormality 2. H/o throat CA 3. GERD Rec: ok for discharge as he has no objective evidence of ischemia. He will need a lexiscan myoview as he is unable to walk due to neuropathy in his legs. I have asked him to increase his protonix.   LOS: 1 day    Gregg Taylor,M.D. 10/11/2012, 12:02 PM

## 2012-10-11 NOTE — Progress Notes (Signed)
IV access removed x2, pt discharged home via wheelchair to car, in care of wife. Stable at time of discharge. Vitals stable. Alert and Oriented x4. Discharge instructions given, pt verbalized understanding.

## 2012-10-11 NOTE — Progress Notes (Signed)
INITIAL NUTRITION ASSESSMENT  Pt meets criteria for severe MALNUTRITION in the context of chronic illness as evidenced by <75% estimated energy intake with 8.9% weight loss in the past 2 months.  DOCUMENTATION CODES Per approved criteria  -Severe malnutrition in the context of chronic illness   INTERVENTION: - Magic Cup BID - Resource Breeze BID - Educated pt on high calorie/protein diet. Encouraged pt to increase meal/snack intake at home to prevent further unintended weight loss.  - Unit RD to continue to monitor   NUTRITION DIAGNOSIS: Unintended weight loss related to cancer as evidenced by pt report.   Goal: Pt to consume 100% of meals and supplements  Monitor:  Weights, labs, intake  Reason for Assessment: Nutrition risk   52 y.o. 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 Pt admitted with chest pain. 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   Met with pt who reports weighing 300 pounds in 2007-2008, now down to 164 pounds from cancer. States he lost 16 pounds in the past 2 months. He used to have a PEG for 2 years when he was getting treatment but does not have it anymore. States he eats 2 meals/day at home that include protein with each meal. Certain foods like breads and cakes he cannot eat because of not having salivary glands but pt can eat soft, moist foods. Says Ensure nutritional supplements give him nausea/vomiting but willing to try [employer] and [employer].   Height: Ht Readings from Last 1 Encounters:  10/10/12 6\' 3"  (1.905 m)    Weight: Wt Readings from Last 1 Encounters:  10/11/12 164 lb 6.4 oz (74.571 kg)    Ideal Body Weight: 196 lb  % Ideal Body Weight: 84%  Wt Readings from Last 10 Encounters:  10/11/12 164 lb 6.4 oz (74.571 kg)  04/29/12 181 lb 6.4 oz (82.283 kg)  07/10/11 186 lb 6.4 oz (84.55 kg)  06/27/11 177 lb 4 oz (80.4 kg)   06/27/11 177 lb 4 oz (80.4 kg)  06/27/11 177 lb 4 oz (80.4 kg)  04/17/11 183 lb 6.4 oz (83.19 kg)  10/10/10 191 lb (86.637 kg)  03/15/11 177 lb 9.6 oz (80.559 kg)    Usual Body Weight: 180 lb per pt  % Usual Body Weight: 91%  BMI:  Body mass index is 20.55 kg/(m^2).  Estimated Nutritional Needs: Kcal: 2250-2600 Protein: 115-135g Fluid: 2.2-2.6L/day  Skin: Intact  Diet Order: Cardiac  EDUCATION NEEDS: -Education needs addressed - discussed high calorie/protein diet and provided handout of this information   Intake/Output Summary (Last 24 hours) at 10/11/12 1229 Last data filed at 10/11/12 1100  Gross per 24 hour  Intake 104.09 ml  Output    300 ml  Net -195.91 ml    Last BM: PTA  Labs:   Recent Labs Lab 10/10/12 1340 10/11/12 0330  NA 139 138  K 4.4 3.8  CL 105 106  CO2 26 23  BUN 14 13  CREATININE 1.07 1.02  CALCIUM 9.8 9.1  GLUCOSE 96 88    CBG (last 3)  No results found for this basename: GLUCAP,  in the last 72 hours  Scheduled Meds: . atorvastatin  40 mg Oral q1800  . levothyroxine  100 mcg Oral QAC breakfast  . LORazepam  2 mg Oral QHS  . metoprolol tartrate  12.5 mg Oral Q6H  . nitroGLYCERIN  1 inch Topical Q6H  . pantoprazole  40 mg Oral Daily  . sodium chloride  3 mL Intravenous Q12H  . traZODone  150 mg Oral QHS    Continuous Infusions: . heparin 1,100 Units/hr (10/11/12 1100)  . nitroGLYCERIN Stopped (10/10/12 2351)    Past Medical History  Diagnosis Date  . Hypothyroidism   . S/P coronary angioplasty 2004    [facility]  . Degenerative joint disease of cervical spine   . GERD (gastroesophageal reflux disease)   . Coronary artery disease   . Dyslipidemia (high LDL; low HDL)   . Complication of anesthesia     "lots of allergies to it" (10/10/2012)  . Chemotherapy-induced neuropathy   . Anginal pain   . Myocardial infarction 2004; 2007; 2013  . Anemia   . History of blood transfusion 2009    "after  throat cancer OR" (10/10/2012)  . 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 Depression   . Basal cell carcinoma of skin   . Melanoma     "right hand or forearm" (10/10/2012)  . Tonsillar cancer     Squamous cell, on the left, stage IV; radiation therapy 10/21/07-12/11/07  . Squamous cell carcinoma     "forearms, hands, head, nose" (10/10/2012)    Past Surgical History  Procedure Laterality Date  . Tonsillectomy Left 2009    Dr. Gordy Levan [facility]  . Posterior fusion cervical spine  September 2012    C5-6  . Pharyngectomy Left 2009    Dr. Gordy Levan  . Partial glossectomy Left     Dr. Gordy Levan  . Neck dissection Left     Dr. Gordy Levan  . Multiple tooth extractions  2009  . Skin cancer excision      Multiple squamous and basal cell carcinomas  . Melanoma excision Right     forearm  . Cervical discectomy  2010    C 5-6  . Esophagogastroduodenoscopy  03/26/2011    Procedure: ESOPHAGOGASTRODUODENOSCOPY (EGD);  Surgeon: Iva Boop, MD;  Location: [facility];  Service: Endoscopy;  Laterality: N/A;  . Balloon dilation  03/26/2011    Procedure: BALLOON DILATION;  Surgeon: Iva Boop, MD;  Location: [facility];  Service: Endoscopy;  Laterality: N/A;  . Colonoscopy  03/26/2011    Procedure: COLONOSCOPY;  Surgeon: Iva Boop, MD;  Location: [facility];  Service: Endoscopy;  Laterality: N/A;  . Coronary angioplasty  06/2011    LAD 40%, OM1 60%, small OM 2 thrombotic treated with PTCA to 20%, RCA occluded but recanalized, EF 50%  . Carotid endarterectomy Left     "I've got a stent" (10/10/2012)    Levon Hedger MS, RD, LDN [phone] Weekend/After Hours Pager

## 2012-10-16 ENCOUNTER — Ambulatory Visit (HOSPITAL_COMMUNITY): Payer: Medicare Other | Attending: Cardiology | Admitting: Radiology

## 2012-10-16 VITALS — BP 111/89 | HR 59 | Ht 75.0 in | Wt 174.0 lb

## 2012-10-16 DIAGNOSIS — Z9861 Coronary angioplasty status: Secondary | ICD-10-CM | POA: Insufficient documentation

## 2012-10-16 DIAGNOSIS — R0789 Other chest pain: Secondary | ICD-10-CM | POA: Insufficient documentation

## 2012-10-16 DIAGNOSIS — R079 Chest pain, unspecified: Secondary | ICD-10-CM

## 2012-10-16 DIAGNOSIS — I2510 Atherosclerotic heart disease of native coronary artery without angina pectoris: Secondary | ICD-10-CM

## 2012-10-16 DIAGNOSIS — R110 Nausea: Secondary | ICD-10-CM | POA: Insufficient documentation

## 2012-10-16 DIAGNOSIS — Z8249 Family history of ischemic heart disease and other diseases of the circulatory system: Secondary | ICD-10-CM | POA: Insufficient documentation

## 2012-10-16 DIAGNOSIS — R61 Generalized hyperhidrosis: Secondary | ICD-10-CM | POA: Insufficient documentation

## 2012-10-16 DIAGNOSIS — R0989 Other specified symptoms and signs involving the circulatory and respiratory systems: Secondary | ICD-10-CM | POA: Insufficient documentation

## 2012-10-16 DIAGNOSIS — R5381 Other malaise: Secondary | ICD-10-CM | POA: Insufficient documentation

## 2012-10-16 DIAGNOSIS — R0609 Other forms of dyspnea: Secondary | ICD-10-CM | POA: Insufficient documentation

## 2012-10-16 DIAGNOSIS — Z8673 Personal history of transient ischemic attack (TIA), and cerebral infarction without residual deficits: Secondary | ICD-10-CM | POA: Insufficient documentation

## 2012-10-16 DIAGNOSIS — F17200 Nicotine dependence, unspecified, uncomplicated: Secondary | ICD-10-CM | POA: Insufficient documentation

## 2012-10-16 DIAGNOSIS — R0602 Shortness of breath: Secondary | ICD-10-CM | POA: Insufficient documentation

## 2012-10-16 DIAGNOSIS — I779 Disorder of arteries and arterioles, unspecified: Secondary | ICD-10-CM | POA: Insufficient documentation

## 2012-10-16 DIAGNOSIS — R42 Dizziness and giddiness: Secondary | ICD-10-CM | POA: Insufficient documentation

## 2012-10-16 DIAGNOSIS — E785 Hyperlipidemia, unspecified: Secondary | ICD-10-CM | POA: Insufficient documentation

## 2012-10-16 DIAGNOSIS — I252 Old myocardial infarction: Secondary | ICD-10-CM | POA: Insufficient documentation

## 2012-10-16 MED ORDER — REGADENOSON 0.4 MG/5ML IV SOLN
0.4000 mg | Freq: Once | INTRAVENOUS | Status: AC
  Administered 2012-10-16: 0.4 mg via INTRAVENOUS

## 2012-10-16 MED ORDER — TECHNETIUM TC 99M SESTAMIBI GENERIC - CARDIOLITE
11.0000 | Freq: Once | INTRAVENOUS | Status: AC | PRN
  Administered 2012-10-16: 11 via INTRAVENOUS

## 2012-10-16 MED ORDER — TECHNETIUM TC 99M SESTAMIBI GENERIC - CARDIOLITE
33.0000 | Freq: Once | INTRAVENOUS | Status: AC | PRN
  Administered 2012-10-16: 33 via INTRAVENOUS

## 2012-10-16 NOTE — Progress Notes (Addendum)
[facility] SITE 3 NUCLEAR MED [address] [place], [place] [address] [phone]    Cardiology Nuclear Med Study  Bobby Floyd is a 52 y.o. male     MRN : [id number]     DOB: 02-13-1961  Procedure Date: 10/16/2012  Nuclear Med Background Indication for Stress Test:  Evaluation for Ischemia, PTCA Patency and Post Hospital on  10/10/12 ER with CP, neg. enzymes History:  '04 MI; '05 Myocardial Perfusion Study-Normal, EF=54%; '07 MI; '13 NSTEMI> Cath: PTCA CFX/OM2, EF= 50%; and 07-2011 GXT: normal Cardiac Risk Factors: Carotid Disease, CVA, Family History - CAD, Lipids and Smoker  Symptoms: Chest Pain/Pressure with/without exertion (last occurrence at present 3/10 but continuous x 1 week worse as the day progresses to 7/10 at night),  Diaphoresis, Dizziness, DOE, Fatigue, Fatigue with Exertion, Light-Headedness, Nausea and SOB   Nuclear Pre-Procedure Caffeine/Decaff Intake:  None NPO After: 7:00pm   Lungs:  clear O2 Sat: 98% on room air. IV 0.9% NS with Angio Cath:  22g  IV Site: R Antecubital  IV Started by:  Bonnita Levan, RN  Chest Size (in):  44 Cup Size: n/a  Height: 6\' 3"  (1.905 m)  Weight:  174 lb (78.926 kg)  BMI:  Body mass index is 21.75 kg/(m^2). Tech Comments:  Dr. Verne Carrow reviewed baseline EKG with ok to do Lexiscan. Irean Hong, RN    Nuclear Med Study 1 or 2 day study: 1 day  Stress Test Type:  Eugenie Birks  Reading MD: Cassell Clement, MD  Order Authorizing Provider:  Marca Ancona, MD  Resting Radionuclide: Technetium 68m Sestamibi  Resting Radionuclide Dose: 11.0 mCi   Stress Radionuclide:  Technetium 65m Sestamibi  Stress Radionuclide Dose: 33.0 mCi           Stress Protocol Rest HR: 59 Stress HR: 85  Rest BP: 111/89 Stress BP: 109/76  Exercise Time (min): n/a METS: n/a   Predicted Max HR: 171 bpm % Max HR: 49.71 bpm Rate Pressure Product: 9265   Dose of Adenosine (mg):  n/a Dose of Lexiscan: 0.4 mg  Dose of Atropine (mg):  n/a Dose of Dobutamine: n/a mcg/kg/min (at max HR)  Stress Test Technologist: Irean Hong, RN  Nuclear Technologist:  Domenic Polite, CNMT     Rest Procedure:  Myocardial perfusion imaging was performed at rest 45 minutes following the intravenous administration of Technetium 41m Sestamibi. Rest ECG: NSR - Normal EKG  Stress Procedure:  The patient received IV Lexiscan 0.4 mg over 15-seconds.  Technetium 67m Sestamibi injected at 30-seconds. The patient had baseline chronic chest pain, and there was no change in chest pain with Lexiscan.   Quantitative spect images were obtained after a 45 minute delay. Stress ECG: No significant change from baseline ECG  QPS Raw Data Images:  Normal; no motion artifact; normal heart/lung ratio. Stress Images:  Normal homogeneous uptake in all areas of the myocardium. Rest Images:  Normal homogeneous uptake in all areas of the myocardium. Subtraction (SDS):  No evidence of ischemia. Transient Ischemic Dilatation (Normal <1.22):  n/a Lung/Heart Ratio (Normal <0.45):  0.35  Quantitative Gated Spect Images QGS EDV:  126 ml QGS ESV:  63 ml  Impression Exercise Capacity:  Lexiscan with no exercise. BP Response:  Normal blood pressure response.  Clinical Symptoms:  No significant symptoms noted. ECG Impression:  No significant ST segment change suggestive of ischemia. Comparison with Prior Nuclear Study: No significant change from previous study  Overall Impression:  Normal stress nuclear study. Normal  apical thinning seen on stress and rest. No ischemia.  LV Ejection Fraction: 50%.  LV Wall Motion:  NL LV Function; NL Wall Motion  Raelynne Ludwick  Low normal EF with no ischemia.  Please inform patient.   Marca Ancona 10/17/2012

## 2012-10-21 ENCOUNTER — Telehealth: Payer: Self-pay | Admitting: *Deleted

## 2012-10-21 NOTE — Telephone Encounter (Signed)
pt notified about myoview results with verbal understanding 

## 2012-10-21 NOTE — Telephone Encounter (Signed)
Message copied by Tarri Fuller on Tue Oct 21, 2012  8:31 AM ------      Message from: Laurey Morale      Created: Mon Oct 20, 2012  6:18 PM       See my addendum to note.  Normal study, inform patient ------

## 2012-11-05 ENCOUNTER — Encounter: Payer: Self-pay | Admitting: Physician Assistant

## 2012-11-05 NOTE — Progress Notes (Signed)
This encounter was created in error - please disregard.

## 2013-04-29 ENCOUNTER — Telehealth: Payer: Self-pay | Admitting: *Deleted

## 2013-04-29 NOTE — Telephone Encounter (Signed)
On 04-29-13 fax medical records to [facility], it was consult note , end of tx note, sim & tx plan note, follow up notes .

## 2013-06-12 ENCOUNTER — Other Ambulatory Visit: Payer: Self-pay

## 2013-06-12 MED ORDER — METOPROLOL TARTRATE 25 MG PO TABS: 12.5000 mg | ORAL_TABLET | Freq: Two times a day (BID) | ORAL | Status: DC

## 2013-06-30 ENCOUNTER — Other Ambulatory Visit: Payer: Self-pay

## 2013-06-30 MED ORDER — METOPROLOL TARTRATE 25 MG PO TABS: 12.5000 mg | ORAL_TABLET | Freq: Two times a day (BID) | ORAL | Status: DC

## 2013-11-09 ENCOUNTER — Encounter (HOSPITAL_COMMUNITY): Payer: Self-pay | Admitting: Emergency Medicine

## 2013-11-09 ENCOUNTER — Emergency Department (HOSPITAL_COMMUNITY): Payer: Medicare HMO

## 2013-11-09 ENCOUNTER — Inpatient Hospital Stay (HOSPITAL_COMMUNITY)
Admission: EM | Admit: 2013-11-09 | Discharge: 2013-11-11 | DRG: 247 | Disposition: A | Discharge status: Home / Self Care | Payer: Medicare HMO | Attending: Cardiology | Admitting: Cardiology

## 2013-11-09 DIAGNOSIS — Z923 Personal history of irradiation: Secondary | ICD-10-CM

## 2013-11-09 DIAGNOSIS — Z7902 Long term (current) use of antithrombotics/antiplatelets: Secondary | ICD-10-CM

## 2013-11-09 DIAGNOSIS — Z602 Problems related to living alone: Secondary | ICD-10-CM

## 2013-11-09 DIAGNOSIS — F1210 Cannabis abuse, uncomplicated: Secondary | ICD-10-CM | POA: Diagnosis present

## 2013-11-09 DIAGNOSIS — F329 Major depressive disorder, single episode, unspecified: Secondary | ICD-10-CM | POA: Diagnosis present

## 2013-11-09 DIAGNOSIS — Z9861 Coronary angioplasty status: Secondary | ICD-10-CM

## 2013-11-09 DIAGNOSIS — I25119 Atherosclerotic heart disease of native coronary artery with unspecified angina pectoris: Secondary | ICD-10-CM

## 2013-11-09 DIAGNOSIS — J4489 Other specified chronic obstructive pulmonary disease: Secondary | ICD-10-CM | POA: Diagnosis present

## 2013-11-09 DIAGNOSIS — I2510 Atherosclerotic heart disease of native coronary artery without angina pectoris: Principal | ICD-10-CM | POA: Diagnosis present

## 2013-11-09 DIAGNOSIS — Z85819 Personal history of malignant neoplasm of unspecified site of lip, oral cavity, and pharynx: Secondary | ICD-10-CM

## 2013-11-09 DIAGNOSIS — E7800 Pure hypercholesterolemia, unspecified: Secondary | ICD-10-CM

## 2013-11-09 DIAGNOSIS — Z7982 Long term (current) use of aspirin: Secondary | ICD-10-CM

## 2013-11-09 DIAGNOSIS — Z888 Allergy status to other drugs, medicaments and biological substances status: Secondary | ICD-10-CM

## 2013-11-09 DIAGNOSIS — I209 Angina pectoris, unspecified: Secondary | ICD-10-CM

## 2013-11-09 DIAGNOSIS — F17200 Nicotine dependence, unspecified, uncomplicated: Secondary | ICD-10-CM | POA: Diagnosis present

## 2013-11-09 DIAGNOSIS — E039 Hypothyroidism, unspecified: Secondary | ICD-10-CM | POA: Diagnosis present

## 2013-11-09 DIAGNOSIS — Z981 Arthrodesis status: Secondary | ICD-10-CM

## 2013-11-09 DIAGNOSIS — E785 Hyperlipidemia, unspecified: Secondary | ICD-10-CM | POA: Diagnosis present

## 2013-11-09 DIAGNOSIS — R079 Chest pain, unspecified: Secondary | ICD-10-CM

## 2013-11-09 DIAGNOSIS — Z9221 Personal history of antineoplastic chemotherapy: Secondary | ICD-10-CM

## 2013-11-09 DIAGNOSIS — F3289 Other specified depressive episodes: Secondary | ICD-10-CM | POA: Diagnosis present

## 2013-11-09 DIAGNOSIS — J449 Chronic obstructive pulmonary disease, unspecified: Secondary | ICD-10-CM | POA: Diagnosis present

## 2013-11-09 DIAGNOSIS — Z955 Presence of coronary angioplasty implant and graft: Secondary | ICD-10-CM

## 2013-11-09 DIAGNOSIS — I200 Unstable angina: Secondary | ICD-10-CM | POA: Diagnosis present

## 2013-11-09 DIAGNOSIS — I252 Old myocardial infarction: Secondary | ICD-10-CM

## 2013-11-09 DIAGNOSIS — D696 Thrombocytopenia, unspecified: Secondary | ICD-10-CM | POA: Diagnosis present

## 2013-11-09 DIAGNOSIS — I214 Non-ST elevation (NSTEMI) myocardial infarction: Secondary | ICD-10-CM | POA: Diagnosis present

## 2013-11-09 DIAGNOSIS — Z6379 Other stressful life events affecting family and household: Secondary | ICD-10-CM

## 2013-11-09 DIAGNOSIS — K219 Gastro-esophageal reflux disease without esophagitis: Secondary | ICD-10-CM | POA: Diagnosis present

## 2013-11-09 DIAGNOSIS — Z85820 Personal history of malignant melanoma of skin: Secondary | ICD-10-CM

## 2013-11-09 DIAGNOSIS — Z720 Tobacco use: Secondary | ICD-10-CM

## 2013-11-09 DIAGNOSIS — Z8249 Family history of ischemic heart disease and other diseases of the circulatory system: Secondary | ICD-10-CM

## 2013-11-09 DIAGNOSIS — F419 Anxiety disorder, unspecified: Secondary | ICD-10-CM

## 2013-11-09 DIAGNOSIS — Z79899 Other long term (current) drug therapy: Secondary | ICD-10-CM

## 2013-11-09 DIAGNOSIS — M47812 Spondylosis without myelopathy or radiculopathy, cervical region: Secondary | ICD-10-CM | POA: Diagnosis present

## 2013-11-09 LAB — TSH: TSH: 6.51 u[IU]/mL — ABNORMAL HIGH (ref 0.350–4.500)

## 2013-11-09 LAB — BASIC METABOLIC PANEL
ANION GAP: 11 (ref 5–15)
BUN: 15 mg/dL (ref 6–23)
CALCIUM: 9.6 mg/dL (ref 8.4–10.5)
CO2: 26 mEq/L (ref 19–32)
CREATININE: 0.97 mg/dL (ref 0.50–1.35)
Chloride: 104 mEq/L (ref 96–112)
Glucose, Bld: 102 mg/dL — ABNORMAL HIGH (ref 70–99)
Potassium: 4.3 mEq/L (ref 3.7–5.3)
Sodium: 141 mEq/L (ref 137–147)

## 2013-11-09 LAB — CBC
HEMATOCRIT: 46.3 % (ref 39.0–52.0)
Hemoglobin: 16.5 g/dL (ref 13.0–17.0)
MCH: 33.1 pg (ref 26.0–34.0)
MCHC: 35.6 g/dL (ref 30.0–36.0)
MCV: 93 fL (ref 78.0–100.0)
PLATELETS: 142 10*3/uL — AB (ref 150–400)
RBC: 4.98 MIL/uL (ref 4.22–5.81)
RDW: 12.9 % (ref 11.5–15.5)
WBC: 7.4 10*3/uL (ref 4.0–10.5)

## 2013-11-09 LAB — MAGNESIUM: Magnesium: 2.1 mg/dL (ref 1.5–2.5)

## 2013-11-09 LAB — I-STAT TROPONIN, ED: Troponin i, poc: 0.04 ng/mL (ref 0.00–0.08)

## 2013-11-09 LAB — MRSA PCR SCREENING: MRSA by PCR: NEGATIVE

## 2013-11-09 LAB — TROPONIN I: Troponin I: <0.3 ng/mL (ref <0.30)

## 2013-11-09 LAB — PROTIME-INR
INR: 1 (ref 0.00–1.49)
Prothrombin Time: 13.2 seconds (ref 11.6–15.2)

## 2013-11-09 MED ORDER — HEPARIN (PORCINE) IN NACL 100-0.45 UNIT/ML-% IJ SOLN
1100.0000 [IU]/h | INTRAMUSCULAR | Status: DC
  Administered 2013-11-09: 1100 [IU]/h via INTRAVENOUS
  Filled 2013-11-09 (×2): qty 250

## 2013-11-09 MED ORDER — PANTOPRAZOLE SODIUM 40 MG PO TBEC
40.0000 mg | DELAYED_RELEASE_TABLET | Freq: Two times a day (BID) | ORAL | Status: DC
  Administered 2013-11-09 – 2013-11-11 (×4): 40 mg via ORAL
  Filled 2013-11-09 (×4): qty 1

## 2013-11-09 MED ORDER — HYDROMORPHONE HCL PF 1 MG/ML IJ SOLN
1.0000 mg | Freq: Once | INTRAMUSCULAR | Status: AC
  Administered 2013-11-09: 1 mg via INTRAVENOUS
  Filled 2013-11-09: qty 1

## 2013-11-09 MED ORDER — ASPIRIN 81 MG PO CHEW
324.0000 mg | CHEWABLE_TABLET | Freq: Once | ORAL | Status: AC
  Administered 2013-11-09: 324 mg via ORAL
  Filled 2013-11-09: qty 4

## 2013-11-09 MED ORDER — DIAZEPAM 5 MG PO TABS
20.0000 mg | ORAL_TABLET | Freq: Every day | ORAL | Status: DC
  Administered 2013-11-09 – 2013-11-10 (×2): 20 mg via ORAL
  Filled 2013-11-09 (×2): qty 4

## 2013-11-09 MED ORDER — ASPIRIN EC 81 MG PO TBEC
81.0000 mg | DELAYED_RELEASE_TABLET | Freq: Every day | ORAL | Status: DC
  Administered 2013-11-10 – 2013-11-11 (×2): 81 mg via ORAL
  Filled 2013-11-09 (×2): qty 1

## 2013-11-09 MED ORDER — LEVOTHYROXINE SODIUM 100 MCG PO TABS
100.0000 ug | ORAL_TABLET | Freq: Every day | ORAL | Status: DC
  Administered 2013-11-10 – 2013-11-11 (×2): 100 ug via ORAL
  Filled 2013-11-09 (×2): qty 1

## 2013-11-09 MED ORDER — ACETAMINOPHEN 325 MG PO TABS: 650.0000 mg | ORAL_TABLET | ORAL | Status: DC | PRN

## 2013-11-09 MED ORDER — SODIUM CHLORIDE 0.9 % IJ SOLN: 3.0000 mL | Freq: Two times a day (BID) | INTRAMUSCULAR | Status: DC

## 2013-11-09 MED ORDER — SODIUM CHLORIDE 0.9 % IV SOLN
1.0000 mL/kg/h | INTRAVENOUS | Status: DC
  Administered 2013-11-10: 1 mL/kg/h via INTRAVENOUS

## 2013-11-09 MED ORDER — SODIUM CHLORIDE 0.9 % IJ SOLN: 3.0000 mL | INTRAMUSCULAR | Status: DC | PRN

## 2013-11-09 MED ORDER — METOPROLOL TARTRATE 12.5 MG HALF TABLET
12.5000 mg | ORAL_TABLET | Freq: Two times a day (BID) | ORAL | Status: DC
  Administered 2013-11-09 – 2013-11-11 (×4): 12.5 mg via ORAL
  Filled 2013-11-09 (×5): qty 1

## 2013-11-09 MED ORDER — ONDANSETRON HCL 4 MG/2ML IJ SOLN
4.0000 mg | Freq: Four times a day (QID) | INTRAMUSCULAR | Status: DC | PRN
  Administered 2013-11-10: 15:00:00 4 mg via INTRAVENOUS
  Filled 2013-11-09: qty 2

## 2013-11-09 MED ORDER — NITROGLYCERIN 0.4 MG SL SUBL: 0.4000 mg | SUBLINGUAL_TABLET | SUBLINGUAL | Status: DC | PRN

## 2013-11-09 MED ORDER — TRAZODONE HCL 150 MG PO TABS
150.0000 mg | ORAL_TABLET | Freq: Every day | ORAL | Status: DC
  Administered 2013-11-09 – 2013-11-10 (×2): 150 mg via ORAL
  Filled 2013-11-09 (×3): qty 1

## 2013-11-09 MED ORDER — NITROGLYCERIN IN D5W 200-5 MCG/ML-% IV SOLN
INTRAVENOUS | Status: AC
  Filled 2013-11-09: qty 250

## 2013-11-09 MED ORDER — ASPIRIN 81 MG PO TABS: 81.0000 mg | ORAL_TABLET | Freq: Every day | ORAL | Status: DC

## 2013-11-09 MED ORDER — NITROGLYCERIN 0.4 MG SL SUBL
0.4000 mg | SUBLINGUAL_TABLET | SUBLINGUAL | Status: DC | PRN
  Filled 2013-11-09: qty 1

## 2013-11-09 MED ORDER — ASPIRIN 81 MG PO CHEW: 324.0000 mg | CHEWABLE_TABLET | ORAL | Status: DC

## 2013-11-09 MED ORDER — SODIUM CHLORIDE 0.9 % IV SOLN: 250.0000 mL | INTRAVENOUS | Status: DC | PRN

## 2013-11-09 MED ORDER — ASPIRIN 300 MG RE SUPP
300.0000 mg | RECTAL | Status: DC
  Filled 2013-11-09: qty 1

## 2013-11-09 MED ORDER — ASPIRIN 81 MG PO CHEW
81.0000 mg | CHEWABLE_TABLET | ORAL | Status: AC
  Administered 2013-11-10: 81 mg via ORAL
  Filled 2013-11-09: qty 1

## 2013-11-09 MED ORDER — NITROGLYCERIN IN D5W 200-5 MCG/ML-% IV SOLN
2.0000 ug/min | INTRAVENOUS | Status: DC
  Administered 2013-11-09: 5 ug/min via INTRAVENOUS

## 2013-11-09 MED ORDER — HEPARIN BOLUS VIA INFUSION
4000.0000 [IU] | Freq: Once | INTRAVENOUS | Status: AC
  Administered 2013-11-09: 4000 [IU] via INTRAVENOUS
  Filled 2013-11-09: qty 4000

## 2013-11-09 MED ORDER — MORPHINE SULFATE 4 MG/ML IJ SOLN
4.0000 mg | Freq: Once | INTRAMUSCULAR | Status: AC
  Administered 2013-11-09: 4 mg via INTRAVENOUS
  Filled 2013-11-09: qty 1

## 2013-11-09 NOTE — ED Notes (Addendum)
PT reports chest pain radiating across pectoral area and into back; Cough worsening with yellow phlegm productive. Pt reports sent home last Monday with chest pain and reports worsening. Cardiac hx.

## 2013-11-09 NOTE — H&P (Signed)
Patient ID: GADGE HERMIZ MRN: [id number], DOB/AGE: 1961/02/28   Admit date: 11/09/2013   Primary Physician: Hilbert Corrigan Primary Cardiologist: Dr. Bing Quarry => Dr. [name] also followed at [facility]   Pt. Profile:  Bobby Floyd is a 53 y.o. male with a hx of CAD, s/p non-STEMI in 06/2011 treated with a BMS to the OM2, known occluded RCA, tonsillar CA s/p chemo/rad/resection, GERD, HLD, long term tobacco abuse (quit last week) and recent admission at [facility] for chest pain who presents with recurrent chest pain.   Patient has history of coronary disease and had a stent in 2012. Since that time he was admitted for recurrent chest pain in 10/2012. He underwent a Lexiscan Myoview at that time which returned normal and he was discharged. He then presented to [facility] with chest pain last week and was admitted for observation. He underwent a Lexiscan Myoview at that time which returned normal and he was discharged. He's had ongoing, constant chest pain since that is associated with shortness of breath and diaphoresis. It is worse with inspiration and recumbency. He followed up with his primary care physician today who told him to present to the ED for further work up as he also had a cough and could have pneumonia. He has had a cough since last Friday. He is very worried and anxious as there has been a delay in the elevation of his troponin in the past when he had a NStemi. He does not regularly followup with cardiology as he is on disability and does not have enough money to make his appointments. He is not sure if he follows with [facility] or with Dr. Aundra Dubin. He does wish she could still follow with Dr. Lia Foyer. He denies tobacco abuse as he quit last week.   Problem List  Past Medical History  Diagnosis Date  . Hypothyroidism   . Degenerative joint disease of cervical spine   . GERD (gastroesophageal reflux disease)   . Coronary artery disease     a.  NSTEMI 4/13 => [facility]:  oLAD 40, then 40, mOM1 50-60, mOM2 occluded (culprit), AVCFX 30, RCA occluded, EF 50% => PCI:  BMS to OM2  . Dyslipidemia (high LDL; low HDL)   . Chemotherapy-induced neuropathy   . Myocardial infarction 2004; 2007; 2013  . Anemia   . History of blood transfusion 2009    "after throat cancer OR" (10/10/2012)  . 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 Depression   . Basal cell carcinoma of skin   . Melanoma     "right hand or forearm" (10/10/2012)  . Tonsillar cancer     Squamous cell, on the left, stage IV; radiation therapy 10/21/07-12/11/07  . Squamous cell carcinoma     "forearms, hands, head, nose" (10/10/2012)    Past Surgical History  Procedure Laterality Date  . Tonsillectomy Left 2009    Dr. Silvio Clayman [facility]  . Posterior fusion cervical spine  September 2012    C5-6  . Pharyngectomy Left 2009    Dr. Silvio Clayman  . Partial glossectomy Left     Dr. Silvio Clayman  . Neck dissection Left     Dr. Silvio Clayman  . Multiple tooth extractions  2009  . Skin cancer excision      Multiple squamous and basal cell carcinomas  . Melanoma excision Right     forearm  . Cervical discectomy  2010    C  5-6  . Esophagogastroduodenoscopy  03/26/2011    Procedure: ESOPHAGOGASTRODUODENOSCOPY (EGD);  Surgeon: Gatha Mayer, MD;  Location: [facility];  Service: Endoscopy;  Laterality: N/A;  . Balloon dilation  03/26/2011    Procedure: BALLOON DILATION;  Surgeon: Gatha Mayer, MD;  Location: [facility];  Service: Endoscopy;  Laterality: N/A;  . Colonoscopy  03/26/2011    Procedure: COLONOSCOPY;  Surgeon: Gatha Mayer, MD;  Location: [facility];  Service: Endoscopy;  Laterality: N/A;  . Coronary angioplasty  06/2011    LAD 40%, OM1 60%, small OM 2 thrombotic treated with PTCA to 20%, RCA occluded but recanalized, EF 50%  . Carotid endarterectomy Left     "I've got a stent" (10/10/2012)     Allergies  Allergies  Allergen Reactions  . Lipitor  [Atorvastatin] Swelling and Other (See Comments)    Urination problems, myalgias also  . Benadryl [Diphenhydramine Hcl] Swelling    Hyperactivity, very   . Ondansetron Other (See Comments)    Headaches  . Promethazine Hcl Nausea And Vomiting  . Propofol     "violent"     Home Medications  Prior to Admission medications   Medication Sig Start Date End Date Taking? Authorizing Provider  atorvastatin (LIPITOR) 40 MG tablet Take 1 tablet (40 mg total) by mouth daily at 6 PM. 10/11/12   Liliane Shi, PA-C  levothyroxine (SYNTHROID, LEVOTHROID) 100 MCG tablet Take 100 mcg by mouth daily.      Historical Provider, MD  LORazepam (ATIVAN) 1 MG tablet Take 2 mg by mouth at bedtime. Take 2 tablets at bedtime    Historical Provider, MD  metoprolol tartrate (LOPRESSOR) 25 MG tablet Take 0.5 tablets (12.5 mg total) by mouth 2 (two) times daily. 06/30/13   Larey Dresser, MD  nitroGLYCERIN (NITROSTAT) 0.4 MG SL tablet Place 1 tablet (0.4 mg total) under the tongue every 5 (five) minutes x 3 doses as needed for chest pain. 10/11/12   Liliane Shi, PA-C  pantoprazole (PROTONIX) 40 MG tablet Take 1 tablet (40 mg total) by mouth 2 (two) times daily. 10/11/12   Liliane Shi, PA-C  traZODone (DESYREL) 150 MG tablet Take 150 mg by mouth at bedtime.  04/17/12   Historical Provider, MD    Family History  Family History  Problem Relation Age of Onset  . Colon cancer Maternal Uncle   . Heart disease Maternal Grandmother   . Cirrhosis Maternal Grandfather     alcoholic  . Cirrhosis Maternal Uncle     alcoholic  . Hypothyroidism Mother   . Goiter Mother    Family Status  Relation Status Death Age  . Mother Alive      Social History  History   Social History  . Marital Status: Divorced    Spouse Name: N/A    Number of Children: N/A  . Years of Education: N/A   Occupational History  . Disabled    Social History Main Topics  . Smoking status: Current Every Day Smoker -- 1.50 packs/day for 20  years    Types: Cigarettes    Last Attempt to Quit: 08/11/2007  . Smokeless tobacco: Never Used  . Alcohol Use: No  . Drug Use: Yes    Special: Marijuana     Comment: 10/10/2012 "I smoked a little grass while I was going thru chemo to help me w/my appetite"  . Sexual Activity: Not Currently   Other Topics Concern  . Not on file   Social History Narrative  Former Development worker, community, he is disabled. Pt lives alone, has a fiance.     All other systems reviewed and are otherwise negative except as noted above.  Physical Exam  Blood pressure 116/84, pulse 59, temperature 97.7 F (36.5 C), temperature source Oral, resp. rate 18, height 6\' 2"  (1.88 m), weight 186 lb (84.369 kg), SpO2 98.00%.  General: Pleasant, NAD. Appears nervous. S/p throat cancer Psych: Normal affect. Neuro: Alert and oriented X 3. Moves all extremities spontaneously. HEENT: Normal  Neck: Supple without bruits or JVD. Lungs:  Resp regular and unlabored, rhonchus breath sounds. Heart: RRR no s3, s4, or murmurs. Abdomen: Soft, non-tender, non-distended, BS + x 4.  Extremities: No clubbing, cyanosis or edema. DP/PT/Radials 2+ and equal bilaterally.  Labs  No results found for this basename: CKTOTAL, CKMB, TROPONINI,  in the last 72 hours Lab Results  Component Value Date   WBC 7.4 11/09/2013   HGB 16.5 11/09/2013   HCT 46.3 11/09/2013   MCV 93.0 11/09/2013   PLT 142* 11/09/2013    Recent Labs Lab 11/09/13 1301  NA 141  K 4.3  CL 104  CO2 26  BUN 15  CREATININE 0.97  CALCIUM 9.6  GLUCOSE 102*     Radiology/Studies  Dg Chest 2 View  11/09/2013   CLINICAL DATA:  Chest pain and cough  EXAM: CHEST  2 VIEW  COMPARISON:  10/30/2013  FINDINGS: The heart size and mediastinal contours are within normal limits. Both lungs are clear. The visualized skeletal structures are unremarkable.  IMPRESSION: No active cardiopulmonary disease.     ECG  Normal sinus rhythm Normal ECG  ASSESSMENT AND PLAN  Bobby Floyd is  a 53 y.o. male with a hx of CAD, s/p non-STEMI in 06/2011 treated with a BMS to the OM2, known occluded RCA, tonsillar CA s/p chemo/rad/resection, GERD, HLD, long term tobacco abuse (quit last week) and recent admission at [facility] for chest pain who presents with recurrent chest pain.   Chest pain- Admitted to [facility] from 10/30/13-11/02/13 and underwent nuclear stress test which was neg for ischemia: LVEF 60%. Small anteroseptal and larger inferior/inferiorseptal scar. Continuous chest pain since that time. Will start on heparin gtt and plan for [facility] tomorrow.  -- Also complains that chest pain is worse with deep inspiration and supine. Will order 2 D ECHO to r/o pericardial effusion -- Continue ASA, BB. Statin intolerant ( swelling and myalgias?)  HLD- statin intolerant. Will order a lipid panel  Hypothyroidism- continue synthroid. Check TSH  Cough- pt was worried about PNA. No white count, afebrile and CXR clear.   Judy Pimple, PA-C 11/09/2013, 4:43 PM  Pager [phone]

## 2013-11-09 NOTE — ED Notes (Signed)
Pt updated that pt wants pain meds.

## 2013-11-09 NOTE — ED Provider Notes (Signed)
CSN: [id number]     Arrival date & time 11/09/13  1249 History   First MD Initiated Contact with Patient 11/09/13 1321     Chief Complaint  Patient presents with  . Chest Pain     (Consider location/radiation/quality/duration/timing/severity/associated sxs/prior Treatment) HPI Comments: Pt comes in with cc of chest pain. Pt has hx of CAD/NSTEMI, in 2013, post cath that time, found to have narrowing that was not amenable to stenting. Pt reports that he had been chest pain free since that visit, but over the past 2 weeks, his chest pain has returned. Chest pain is left sided, radiates to his shoulder and back, and feels similar to his MI. He was admitted to [facility] last week, discharged on Monday - and had a negative stress test. Pt has extensive family hx of CAD and has extensive smoking hx.  The history is provided by the patient and medical records.    Past Medical History  Diagnosis Date  . Hypothyroidism   . Degenerative joint disease of cervical spine   . GERD (gastroesophageal reflux disease)   . Coronary artery disease     a. NSTEMI 4/13 => LHC:  oLAD 40, then 40, mOM1 50-60, mOM2 occluded (culprit), AVCFX 30, RCA occluded, EF 50% => PCI:  BMS to OM2  . Dyslipidemia (high LDL; low HDL)   . Chemotherapy-induced neuropathy   . Myocardial infarction 2004; 2007; 2013  . Anemia   . History of blood transfusion 2009    "after throat cancer OR" (10/10/2012)  . 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 Depression   . Basal cell carcinoma of skin   . Melanoma     "right hand or forearm" (10/10/2012)  . Tonsillar cancer     Squamous cell, on the left, stage IV; radiation therapy 10/21/07-12/11/07  . Squamous cell carcinoma     "forearms, hands, head, nose" (10/10/2012)   Past Surgical History  Procedure Laterality Date  . Tonsillectomy Left 2009    Dr. Silvio Clayman [facility]  . Posterior fusion cervical spine  September 2012    C5-6  . Pharyngectomy  Left 2009    Dr. Silvio Clayman  . Partial glossectomy Left     Dr. Silvio Clayman  . Neck dissection Left     Dr. Silvio Clayman  . Multiple tooth extractions  2009  . Skin cancer excision      Multiple squamous and basal cell carcinomas  . Melanoma excision Right     forearm  . Cervical discectomy  2010    C 5-6  . Esophagogastroduodenoscopy  03/26/2011    Procedure: ESOPHAGOGASTRODUODENOSCOPY (EGD);  Surgeon: Gatha Mayer, MD;  Location: [facility];  Service: Endoscopy;  Laterality: N/A;  . Balloon dilation  03/26/2011    Procedure: BALLOON DILATION;  Surgeon: Gatha Mayer, MD;  Location: [facility];  Service: Endoscopy;  Laterality: N/A;  . Colonoscopy  03/26/2011    Procedure: COLONOSCOPY;  Surgeon: Gatha Mayer, MD;  Location: [facility];  Service: Endoscopy;  Laterality: N/A;  . Coronary angioplasty  06/2011    LAD 40%, OM1 60%, small OM 2 thrombotic treated with PTCA to 20%, RCA occluded but recanalized, EF 50%  . Carotid endarterectomy Left     "I've got a stent" (10/10/2012)   Family History  Problem Relation Age of Onset  . Colon cancer Maternal Uncle   . Heart disease Maternal Grandmother   . Cirrhosis Maternal Grandfather  alcoholic  . Cirrhosis Maternal Uncle     alcoholic  . Hypothyroidism Mother   . Goiter Mother    History  Substance Use Topics  . Smoking status: Current Every Day Smoker -- 1.50 packs/day for 20 years    Types: Cigarettes    Last Attempt to Quit: 08/11/2007  . Smokeless tobacco: Never Used  . Alcohol Use: No    Review of Systems  Constitutional: Negative for fever, chills, activity change and appetite change.  Eyes: Negative for visual disturbance.  Respiratory: Negative for cough, chest tightness and shortness of breath.   Cardiovascular: Positive for chest pain.  Gastrointestinal: Negative for abdominal pain and abdominal distention.  Genitourinary: Negative for dysuria, enuresis and difficulty urinating.  Musculoskeletal: Negative for  arthralgias and neck pain.  Neurological: Negative for dizziness, light-headedness and headaches.  Psychiatric/Behavioral: Negative for confusion.      Allergies  Lipitor; Other; Propofol; Benadryl; Ondansetron; and Promethazine hcl  Home Medications   Prior to Admission medications   Medication Sig Start Date End Date Taking? Authorizing Provider  aspirin 81 MG tablet Take 81 mg by mouth daily.   Yes Historical Provider, MD  diazepam (VALIUM) 10 MG tablet Take 20 mg by mouth at bedtime.   Yes Historical Provider, MD  levothyroxine (SYNTHROID, LEVOTHROID) 100 MCG tablet Take 100 mcg by mouth daily.     Yes Historical Provider, MD  metoprolol tartrate (LOPRESSOR) 25 MG tablet Take 0.5 tablets (12.5 mg total) by mouth 2 (two) times daily. 06/30/13  Yes Larey Dresser, MD  pantoprazole (PROTONIX) 40 MG tablet Take 1 tablet (40 mg total) by mouth 2 (two) times daily. 10/11/12  Yes Scott Joylene Draft, PA-C  traZODone (DESYREL) 150 MG tablet Take 150 mg by mouth at bedtime.  04/17/12  Yes Historical Provider, MD  nitroGLYCERIN (NITROSTAT) 0.4 MG SL tablet Place 1 tablet (0.4 mg total) under the tongue every 5 (five) minutes x 3 doses as needed for chest pain. 10/11/12   Liliane Shi, PA-C   BP 116/84  Pulse 59  Temp(Src) 97.7 F (36.5 C) (Oral)  Resp 18  Ht 6\' 2"  (1.88 m)  Wt 186 lb (84.369 kg)  BMI 23.87 kg/m2  SpO2 98% Physical Exam  Nursing note and vitals reviewed. Constitutional: He is oriented to person, place, and time. He appears well-developed.  HENT:  Head: Normocephalic and atraumatic.  Eyes: Conjunctivae and EOM are normal. Pupils are equal, round, and reactive to light.  Neck: Normal range of motion. Neck supple.  Cardiovascular: Normal rate and regular rhythm.   Pulmonary/Chest: Effort normal and breath sounds normal.  Abdominal: Soft. Bowel sounds are normal. He exhibits no distension. There is no tenderness. There is no rebound and no guarding.  Neurological: He is alert  and oriented to person, place, and time.  Skin: Skin is warm.    ED Course  Procedures (including critical care time) Labs Review Labs Reviewed  CBC - Abnormal; Notable for the following:    Platelets 142 (*)    All other components within normal limits  BASIC METABOLIC PANEL - Abnormal; Notable for the following:    Glucose, Bld 102 (*)    All other components within normal limits  I-STAT TROPOININ, ED    Imaging Review Dg Chest 2 View  11/09/2013   CLINICAL DATA:  Chest pain and cough  EXAM: CHEST  2 VIEW  COMPARISON:  10/30/2013  FINDINGS: The heart size and mediastinal contours are within normal limits. Both lungs are  clear. The visualized skeletal structures are unremarkable.  IMPRESSION: No active cardiopulmonary disease.   Electronically Signed   By: Skipper Cliche M.D.   On: 11/09/2013 13:57     EKG Interpretation   Date/Time:  Monday November 09 2013 12:53:46 EDT Ventricular Rate:  66 PR Interval:  148 QRS Duration: 94 QT Interval:  392 QTC Calculation: 410 R Axis:   65 Text Interpretation:  Normal sinus rhythm Normal ECG Confirmed by  Kathrynn Humble, MD, Thelma Comp [phone]) on 11/09/2013 1:20:19 PM      MDM   Final diagnoses:  Chest pain, unspecified chest pain type    Differential diagnosis includes: ACS syndrome CHF exacerbation Valvular disorder Myocarditis Pericarditis Pericardial effusion Pneumonia Pleural effusion Pulmonary edema PE  Pt comes in with cc of chest pain. Similar to his NSTEMI pain. Pt just discharged from [facility] - where he had a stress and serial troponins, all negative.  Chest pain is persistent, and so we have consulted our Cards team to see the patient. Records retrieved from [facility]. EKG shows no acute findings.  ASA given.  Varney Biles, MD 11/09/13 1731

## 2013-11-09 NOTE — H&P (Signed)
Patient seen, and personally interviewed and examined.  Agree with not as outlined by Angelena Form, PA-C.  Patient has a history of chronically occluded RCA and BMS to the OM2 in the setting of NSTEMI in 2013.  He has had recurrent CP in the past with normal nuclear stress testing most recently a week ago at [facility].  He now presents with recurrent CP with both typical and atypical components.  Exam is benign.  He is convinced that is pain is identical to his prior angina and MI and is very frustrated that no one at [facility] hospital would listen to him.  He now continues to have intermittent chest pain with SOB and diaphoresis.    Will admit to tele bed for rule out.  Continue BB/statin.  Start ASA and IV Heparin gtt.  Cycle cardiac enzymes.  Make NPO after MN.  Would recommend cath in am to redefine coronary anatomy since he continues to complain of CP despite normal nuclear stress test.  Need to consider restenosis of OM stent.

## 2013-11-09 NOTE — ED Notes (Signed)
Cardiology at bedside.

## 2013-11-09 NOTE — ED Notes (Addendum)
Pt reports cp since 8/20. Reports cardiac hx. States was discharged from [facility] with CP. Reports cough. States he was told he has angina. Pt is a x 4. Denies n/v. Skin warm and dry. Reports the pain is in his left chest and radiates to his back.

## 2013-11-09 NOTE — ED Notes (Signed)
MD at bedside. 

## 2013-11-09 NOTE — Progress Notes (Signed)
ANTICOAGULATION CONSULT NOTE - Initial Consult  Pharmacy Consult for heparin Indication: chest pain/ACS  Allergies  Allergen Reactions  . Lipitor [Atorvastatin] Swelling and Other (See Comments)    Urination problems, myalgias also  . Other Other (See Comments)    Possible resistance to all narcotic pain meds-dilaudid might work  . Propofol Other (See Comments)    "violent"  . Benadryl [Diphenhydramine Hcl] Swelling    Hyperactivity, very   . Ondansetron Other (See Comments)    Headaches  . Promethazine Hcl Nausea And Vomiting    Patient Measurements: Height: 6\' 2"  (188 cm) Weight: 186 lb (84.369 kg) IBW/kg (Calculated) : 82.2 Heparin Dosing Weight: 84.4kg  Vital Signs: Temp: 97.7 F (36.5 C) (08/31 1258) Temp src: Oral (08/31 1258) BP: 144/99 mmHg (08/31 1700) Pulse Rate: 59 (08/31 1530)  Labs:  Recent Labs  11/09/13 1301  HGB 16.5  HCT 46.3  PLT 142*  CREATININE 0.97    Estimated Creatinine Clearance: 105.9 ml/min (by C-G formula based on Cr of 0.97).   Medical History: Past Medical History  Diagnosis Date  . Hypothyroidism   . Degenerative joint disease of cervical spine   . GERD (gastroesophageal reflux disease)   . Coronary artery disease     a. NSTEMI 4/13 => LHC:  oLAD 40, then 40, mOM1 50-60, mOM2 occluded (culprit), AVCFX 30, RCA occluded, EF 50% => PCI:  BMS to OM2  . Dyslipidemia (high LDL; low HDL)   . Chemotherapy-induced neuropathy   . Myocardial infarction 2004; 2007; 2013  . Anemia   . History of blood transfusion 2009    "after throat cancer OR" (10/10/2012)  . 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 Depression   . Basal cell carcinoma of skin   . Melanoma     "right hand or forearm" (10/10/2012)  . Tonsillar cancer     Squamous cell, on the left, stage IV; radiation therapy 10/21/07-12/11/07  . Squamous cell carcinoma     "forearms, hands, head, nose" (10/10/2012)    Medications:  Infusions:   . heparin    . heparin      Assessment: 41 yom with a history of recurrent chest pain to start IV heparin for anticoagulation. Baseline H/H is WNL and plts are slightly low. He is not on any anticoagulation PTA.   Goal of Therapy:  Heparin level 0.3-0.7 units/ml Monitor platelets by anticoagulation protocol: Yes   Plan:  1. Heparin bolus 4000 units IV x 1 2. Heparin gtt 1100 units/hr 3. Check a 6 hour HL 4. Daily HL and CBC  Bobby Floyd, Bobby Floyd 11/09/2013,5:48 PM

## 2013-11-09 NOTE — ED Notes (Signed)
Patient transported to X-ray 

## 2013-11-10 ENCOUNTER — Ambulatory Visit (HOSPITAL_COMMUNITY): Admission: RE | Admit: 2013-11-10 | Payer: Medicare HMO | Source: Ambulatory Visit | Admitting: Cardiology

## 2013-11-10 ENCOUNTER — Encounter (HOSPITAL_COMMUNITY)
Admission: EM | Disposition: A | Discharge status: Home / Self Care | Payer: Commercial Managed Care - HMO | Source: Home / Self Care | Attending: Cardiology

## 2013-11-10 ENCOUNTER — Encounter (HOSPITAL_COMMUNITY): Payer: Self-pay | Admitting: General Practice

## 2013-11-10 DIAGNOSIS — F17200 Nicotine dependence, unspecified, uncomplicated: Secondary | ICD-10-CM | POA: Diagnosis present

## 2013-11-10 DIAGNOSIS — Z79899 Other long term (current) drug therapy: Secondary | ICD-10-CM | POA: Diagnosis not present

## 2013-11-10 DIAGNOSIS — Z955 Presence of coronary angioplasty implant and graft: Secondary | ICD-10-CM | POA: Insufficient documentation

## 2013-11-10 DIAGNOSIS — Z888 Allergy status to other drugs, medicaments and biological substances status: Secondary | ICD-10-CM | POA: Diagnosis not present

## 2013-11-10 DIAGNOSIS — Z85820 Personal history of malignant melanoma of skin: Secondary | ICD-10-CM | POA: Diagnosis not present

## 2013-11-10 DIAGNOSIS — K219 Gastro-esophageal reflux disease without esophagitis: Secondary | ICD-10-CM | POA: Diagnosis present

## 2013-11-10 DIAGNOSIS — E039 Hypothyroidism, unspecified: Secondary | ICD-10-CM | POA: Diagnosis present

## 2013-11-10 DIAGNOSIS — Z8249 Family history of ischemic heart disease and other diseases of the circulatory system: Secondary | ICD-10-CM | POA: Diagnosis not present

## 2013-11-10 DIAGNOSIS — J449 Chronic obstructive pulmonary disease, unspecified: Secondary | ICD-10-CM | POA: Diagnosis present

## 2013-11-10 DIAGNOSIS — Z602 Problems related to living alone: Secondary | ICD-10-CM | POA: Diagnosis not present

## 2013-11-10 DIAGNOSIS — R072 Precordial pain: Secondary | ICD-10-CM

## 2013-11-10 DIAGNOSIS — Z981 Arthrodesis status: Secondary | ICD-10-CM | POA: Diagnosis not present

## 2013-11-10 DIAGNOSIS — F1210 Cannabis abuse, uncomplicated: Secondary | ICD-10-CM | POA: Diagnosis present

## 2013-11-10 DIAGNOSIS — I252 Old myocardial infarction: Secondary | ICD-10-CM | POA: Diagnosis not present

## 2013-11-10 DIAGNOSIS — I2510 Atherosclerotic heart disease of native coronary artery without angina pectoris: Principal | ICD-10-CM

## 2013-11-10 DIAGNOSIS — I200 Unstable angina: Secondary | ICD-10-CM | POA: Diagnosis present

## 2013-11-10 DIAGNOSIS — Z9221 Personal history of antineoplastic chemotherapy: Secondary | ICD-10-CM | POA: Diagnosis not present

## 2013-11-10 DIAGNOSIS — Z85819 Personal history of malignant neoplasm of unspecified site of lip, oral cavity, and pharynx: Secondary | ICD-10-CM | POA: Diagnosis not present

## 2013-11-10 DIAGNOSIS — D696 Thrombocytopenia, unspecified: Secondary | ICD-10-CM | POA: Diagnosis present

## 2013-11-10 DIAGNOSIS — M47812 Spondylosis without myelopathy or radiculopathy, cervical region: Secondary | ICD-10-CM | POA: Diagnosis present

## 2013-11-10 DIAGNOSIS — Z7982 Long term (current) use of aspirin: Secondary | ICD-10-CM | POA: Diagnosis not present

## 2013-11-10 DIAGNOSIS — Z923 Personal history of irradiation: Secondary | ICD-10-CM | POA: Diagnosis not present

## 2013-11-10 DIAGNOSIS — F3289 Other specified depressive episodes: Secondary | ICD-10-CM | POA: Diagnosis present

## 2013-11-10 DIAGNOSIS — F329 Major depressive disorder, single episode, unspecified: Secondary | ICD-10-CM | POA: Diagnosis present

## 2013-11-10 DIAGNOSIS — Z7902 Long term (current) use of antithrombotics/antiplatelets: Secondary | ICD-10-CM | POA: Diagnosis not present

## 2013-11-10 DIAGNOSIS — I517 Cardiomegaly: Secondary | ICD-10-CM

## 2013-11-10 DIAGNOSIS — Z6379 Other stressful life events affecting family and household: Secondary | ICD-10-CM | POA: Diagnosis not present

## 2013-11-10 DIAGNOSIS — R079 Chest pain, unspecified: Secondary | ICD-10-CM | POA: Diagnosis present

## 2013-11-10 DIAGNOSIS — E785 Hyperlipidemia, unspecified: Secondary | ICD-10-CM | POA: Diagnosis present

## 2013-11-10 DIAGNOSIS — I209 Angina pectoris, unspecified: Secondary | ICD-10-CM | POA: Diagnosis present

## 2013-11-10 DIAGNOSIS — Z9861 Coronary angioplasty status: Secondary | ICD-10-CM | POA: Diagnosis not present

## 2013-11-10 HISTORY — PX: LEFT HEART CATHETERIZATION WITH CORONARY ANGIOGRAM: SHX5451

## 2013-11-10 HISTORY — PX: CORONARY STENT PLACEMENT: SHX1402

## 2013-11-10 LAB — COMPREHENSIVE METABOLIC PANEL
ALK PHOS: 60 U/L (ref 39–117)
ALT: 10 U/L (ref 0–53)
AST: 12 U/L (ref 0–37)
Albumin: 3.4 g/dL — ABNORMAL LOW (ref 3.5–5.2)
Anion gap: 7 (ref 5–15)
BILIRUBIN TOTAL: 0.5 mg/dL (ref 0.3–1.2)
BUN: 15 mg/dL (ref 6–23)
CO2: 29 meq/L (ref 19–32)
Calcium: 9.1 mg/dL (ref 8.4–10.5)
Chloride: 106 mEq/L (ref 96–112)
Creatinine, Ser: 1.18 mg/dL (ref 0.50–1.35)
GFR calc Af Amer: 82 mL/min — ABNORMAL LOW (ref >90)
GFR, EST NON AFRICAN AMERICAN: 70 mL/min — AB (ref >90)
GLUCOSE: 111 mg/dL — AB (ref 70–99)
POTASSIUM: 4.1 meq/L (ref 3.7–5.3)
SODIUM: 142 meq/L (ref 137–147)
Total Protein: 5.7 g/dL — ABNORMAL LOW (ref 6.0–8.3)

## 2013-11-10 LAB — LIPID PANEL
CHOL/HDL RATIO: 3.3 ratio
Cholesterol: 172 mg/dL (ref 0–200)
HDL: 52 mg/dL (ref >39)
LDL Cholesterol: 105 mg/dL — ABNORMAL HIGH (ref 0–99)
TRIGLYCERIDES: 76 mg/dL (ref <150)
VLDL: 15 mg/dL (ref 0–40)

## 2013-11-10 LAB — CBC
HCT: 43.6 % (ref 39.0–52.0)
HEMOGLOBIN: 15 g/dL (ref 13.0–17.0)
MCH: 32.9 pg (ref 26.0–34.0)
MCHC: 34.4 g/dL (ref 30.0–36.0)
MCV: 95.6 fL (ref 78.0–100.0)
Platelets: 125 10*3/uL — ABNORMAL LOW (ref 150–400)
RBC: 4.56 MIL/uL (ref 4.22–5.81)
RDW: 12.9 % (ref 11.5–15.5)
WBC: 5.8 10*3/uL (ref 4.0–10.5)

## 2013-11-10 LAB — PROTIME-INR
INR: 1.01 (ref 0.00–1.49)
Prothrombin Time: 13.3 seconds (ref 11.6–15.2)

## 2013-11-10 LAB — HEPARIN LEVEL (UNFRACTIONATED): HEPARIN UNFRACTIONATED: 0.43 [IU]/mL (ref 0.30–0.70)

## 2013-11-10 LAB — HEMOGLOBIN A1C
Hgb A1c MFr Bld: 5.8 % — ABNORMAL HIGH (ref <5.7)
MEAN PLASMA GLUCOSE: 120 mg/dL — AB (ref <117)

## 2013-11-10 LAB — POCT ACTIVATED CLOTTING TIME: Activated Clotting Time: 478 seconds

## 2013-11-10 LAB — TROPONIN I

## 2013-11-10 SURGERY — LEFT HEART CATHETERIZATION WITH CORONARY ANGIOGRAM
Anesthesia: LOCAL

## 2013-11-10 MED ORDER — LABETALOL HCL 5 MG/ML IV SOLN
20.0000 mg | Freq: Once | INTRAVENOUS | Status: AC
  Administered 2013-11-10: 10 mg via INTRAVENOUS
  Filled 2013-11-10: qty 4

## 2013-11-10 MED ORDER — TICAGRELOR 90 MG PO TABS
ORAL_TABLET | ORAL | Status: AC
  Filled 2013-11-10: qty 2

## 2013-11-10 MED ORDER — MIDAZOLAM HCL 2 MG/2ML IJ SOLN
INTRAMUSCULAR | Status: AC
  Filled 2013-11-10: qty 2

## 2013-11-10 MED ORDER — VERAPAMIL HCL 2.5 MG/ML IV SOLN
INTRAVENOUS | Status: AC
  Filled 2013-11-10: qty 2

## 2013-11-10 MED ORDER — ONDANSETRON HCL 4 MG/2ML IJ SOLN
4.0000 mg | INTRAMUSCULAR | Status: DC | PRN
  Administered 2013-11-10 (×2): 4 mg via INTRAVENOUS
  Filled 2013-11-10 (×2): qty 2

## 2013-11-10 MED ORDER — DIAZEPAM 5 MG/ML IJ SOLN
5.0000 mg | Freq: Once | INTRAMUSCULAR | Status: AC
  Administered 2013-11-10: 5 mg via INTRAVENOUS
  Filled 2013-11-10: qty 2

## 2013-11-10 MED ORDER — LABETALOL HCL 5 MG/ML IV SOLN
10.0000 mg | INTRAVENOUS | Status: DC | PRN
  Administered 2013-11-10: 10 mg via INTRAVENOUS
  Filled 2013-11-10: qty 4

## 2013-11-10 MED ORDER — ALUM & MAG HYDROXIDE-SIMETH 200-200-20 MG/5ML PO SUSP
30.0000 mL | ORAL | Status: DC | PRN
  Administered 2013-11-10: 16:00:00 30 mL via ORAL
  Filled 2013-11-10: qty 30

## 2013-11-10 MED ORDER — HEPARIN (PORCINE) IN NACL 2-0.9 UNIT/ML-% IJ SOLN
INTRAMUSCULAR | Status: AC
  Filled 2013-11-10: qty 1500

## 2013-11-10 MED ORDER — HYDRALAZINE HCL 20 MG/ML IJ SOLN
10.0000 mg | Freq: Four times a day (QID) | INTRAMUSCULAR | Status: DC | PRN
  Administered 2013-11-10: 10 mg via INTRAVENOUS
  Filled 2013-11-10: qty 1

## 2013-11-10 MED ORDER — OXYCODONE-ACETAMINOPHEN 5-325 MG PO TABS
1.0000 | ORAL_TABLET | ORAL | Status: DC | PRN
  Administered 2013-11-10 – 2013-11-11 (×3): 2 via ORAL
  Filled 2013-11-10 (×3): qty 2

## 2013-11-10 MED ORDER — ADENOSINE 12 MG/4ML IV SOLN
16.0000 mL | Freq: Once | INTRAVENOUS | Status: DC
  Filled 2013-11-10: qty 16

## 2013-11-10 MED ORDER — MIDAZOLAM HCL 2 MG/2ML IJ SOLN
INTRAMUSCULAR | Status: AC
Start: 2013-11-10 — End: 2013-11-10
  Filled 2013-11-10: qty 2

## 2013-11-10 MED ORDER — FENTANYL CITRATE 0.05 MG/ML IJ SOLN
INTRAMUSCULAR | Status: AC
  Filled 2013-11-10: qty 2

## 2013-11-10 MED ORDER — LIDOCAINE HCL (PF) 1 % IJ SOLN
INTRAMUSCULAR | Status: AC
  Filled 2013-11-10: qty 30

## 2013-11-10 MED ORDER — HEPARIN SODIUM (PORCINE) 1000 UNIT/ML IJ SOLN
INTRAMUSCULAR | Status: AC
  Filled 2013-11-10: qty 1

## 2013-11-10 MED ORDER — TICAGRELOR 90 MG PO TABS
90.0000 mg | ORAL_TABLET | Freq: Two times a day (BID) | ORAL | Status: DC
  Administered 2013-11-10 – 2013-11-11 (×2): 90 mg via ORAL
  Filled 2013-11-10 (×3): qty 1

## 2013-11-10 MED ORDER — NITROGLYCERIN IN D5W 200-5 MCG/ML-% IV SOLN: 2.0000 ug/min | INTRAVENOUS | Status: DC

## 2013-11-10 MED ORDER — FUROSEMIDE 10 MG/ML IJ SOLN
INTRAMUSCULAR | Status: AC
  Filled 2013-11-10: qty 4

## 2013-11-10 MED ORDER — BIVALIRUDIN 250 MG IV SOLR
INTRAVENOUS | Status: AC
  Filled 2013-11-10: qty 250

## 2013-11-10 MED ORDER — NITROGLYCERIN 1 MG/10 ML FOR IR/CATH LAB
INTRA_ARTERIAL | Status: AC
  Filled 2013-11-10: qty 10

## 2013-11-10 MED ORDER — FUROSEMIDE 10 MG/ML IJ SOLN
40.0000 mg | Freq: Once | INTRAMUSCULAR | Status: AC
  Administered 2013-11-10: 40 mg via INTRAVENOUS

## 2013-11-10 MED ORDER — FUROSEMIDE 10 MG/ML IJ SOLN: 40.0000 mg | Freq: Once | INTRAMUSCULAR | Status: DC

## 2013-11-10 MED ORDER — DIAZEPAM 5 MG PO TABS
5.0000 mg | ORAL_TABLET | Freq: Once | ORAL | Status: AC
  Administered 2013-11-10: 12:00:00 5 mg via ORAL
  Filled 2013-11-10: qty 1

## 2013-11-10 MED ORDER — HYDRALAZINE HCL 20 MG/ML IJ SOLN: 20.0000 mg | Freq: Four times a day (QID) | INTRAMUSCULAR | Status: DC | PRN

## 2013-11-10 MED ORDER — LABETALOL HCL 5 MG/ML IV SOLN: 20.0000 mg | INTRAVENOUS | Status: DC | PRN

## 2013-11-10 NOTE — Care Management Note (Addendum)
  Page 2 of 2   11/11/2013     10:03:40 AM CARE MANAGEMENT NOTE 11/11/2013  Patient:  Bobby Floyd, Bobby Floyd   Account Number:  [id number]  Date Initiated:  11/10/2013  Documentation initiated by:  Mariann Laster  Subjective/Objective Assessment:   CP     Action/Plan:   CM to follow for disposition needs   Anticipated DC Date:  11/11/2013   Anticipated DC Plan:  [place]  CM consult  Medication Assistance      Choice offered to / List presented to:             Status of service:  Completed, signed off Medicare Important Message given?   (If response is "NO", the following Medicare IM given date fields will be blank) Date Medicare IM given:   Medicare IM given by:   Date Additional Medicare IM given:   Additional Medicare IM given by:    Discharge Disposition:  HOME/SELF CARE  Per UR Regulation:  Reviewed for med. necessity/level of care/duration of stay  If discussed at [place] of Stay Meetings, dates discussed:    Comments:  Sharika Mosquera RN, BSN, MSHL, CCM  Nurse - Case Manager,  (Unit [phone]  11/11/2013 IM:  Initial IM 8/31 provided by Admissions Dispostion Plan:  Home / Self care.   Benefits Check Update: PT COPAY WILL BE $42.95-PRIOR AUTH NOT [facility]  phone: [phone] - not available [id number] [place], [place] available.  Patient will [facility] up Rx[facility]Bril[facility]. Brilinta card reviewed with patient (30 free + $18.00 copay assistance card) Dispostion Plan:  Home / Self care.  Graysen Woodyard RN, BSN, MSHL, CCM  Nurse - Case Manager,  (Unit [phone]  11/10/2013 ---11/10/2013 1507 by Mariann Laster--- Benefits check: ticagrelor (BRILINTA) tablet 90 mg  :  Dose 90 mg  :  Oral :  2 times daily coverage, co-pay, deductibles, authorizations, pharmacy requirements. Sharada Albornoz RN, BSN, [place], CCM  Nurse - Case Manager,  (Unit [phone]  11/10/2013

## 2013-11-10 NOTE — CV Procedure (Signed)
CARDIAC CATHETERIZATION AND PERCUTANEOUS CORONARY INTERVENTION REPORT  NAME:  Bobby Floyd   MRN: [id number] DOB:  10-08-1960   ADMIT DATE: 11/09/2013 Procedure Date: 11/10/2013  INTERVENTIONAL CARDIOLOGIST: Leonie Man, M.D., MS PRIMARY CARE PROVIDER: Hilbert Corrigan PRIMARY CARDIOLOGIST: [name], MD  PATIENT:  Bobby Floyd is a 53 y.o. male with a history of PTCA only to the OM 2 in the setting of a non-STEMI in April 2013. He had reported occluded RCA, however it was essentially a nondominant, diffusely diseased vessel with what appeared to be a recanalized total occlusion in the mid vessel. Due to doing relatively well until the last couple weeks, when he began to have additional similar chest pain in August of 2014. He had a negative Myoview at that time. He then was admitted to [facility] one week ago with an episode of early morning awakening with chest discomfort alleviated with nitroglycerin. At present until actually he became presyncopal several hours later.   He was evaluated after several days in the hospital and was found to have a negative stress test or repeat study. There was concern for possible pneumonia. He continued to have intermittent episodes of chest discomfort and with minimal exertion equivalent to this class III angina and was readmitted on August 31. He was seen and evaluated by Dr. Fransico Him, who was concerned for possible restenosis of the POBA site. He is therefore referred for invasive evaluation and possible intervention.  PRE-OPERATIVE DIAGNOSIS:    Class III Angina  Known CAD - s/p POBA of OM2  PROCEDURES PERFORMED:   1. LEFT HEART Catheterization with Native Coroanry Angiography via RIGHT RADIAL Artery access 2. 2 SITE PERCUTANEOUS CORONARY INTERVENTION OF MID 95-99% & DISTAL~80% DOMINANT CIRCUMFLEX - PROMUS PREMIER DES 3.0 MM X 18 mid (~3.2 mm) , PROMUS PREMIER DES 3.0 mm x 12 mm distal (~3.0 mm) 3. PERCUTANEOUS  CORONARY INTERVENTION OF PROX-MID LAD 70% LESION WITH XIENCE ALPINE DES 3.5 MM X 14 MM (3.75 MM)  PROCEDURE: The patient was brought to the 2nd [place] [facility]atheter[facility] Lab in the fasting state and prepped and draped in the usual sterile fashion for Right Radial artery access. A modified Allen's test was performed on the right wrist demonstrating excellent collateral flow for radial access.   Sterile technique was used including antiseptics, cap, gloves, gown, hand hygiene, mask and sheet. Skin prep: Chlorhexidine.   Consent: Risks of procedure as well as the alternatives and risks of each were explained to the (patient/caregiver). Consent for procedure obtained.   Time Out: Verified patient identification, verified procedure, site/side was marked, verified correct patient position, special equipment/implants available, medications/allergies/relevent history reviewed, required imaging and test results available. Performed.  Access:   Right Radial Artery: 6 Fr Sheath -  Seldinger Technique (Angiocath Micropuncture Kit)  Radial Cocktail - 10 mL; IV Heparin 4500 Units   Left Heart Catheterization: 5 Fr Catheters advanced or exchanged over a long exchange safety J-wire; TIG 4.0 catheter advanced first.  Left Coronary Artery Cineangiography: TIG 4.0 Catheter  Right Coronary Artery Cineangiography: JR 4 Catheter   LV Hemodynamics (LV Gram): Angled pigtail  Sheath removed in the cath lab with TR band placem for hemostasis.  TR Band: 040  Hours; 14 mL air  FINDINGS:  Hemodynamics:   Central Aortic Pressure / Mean: 90/62/76 mmHg  Left Ventricular Pressure / LVEDP: 93/5/10 mmHg  Left Ventriculography:  EF: 60-65 %  Wall Motion: Normal  Coronary Anatomy:  Dominance: Left  Left Main: Large caliber vessel that bifurcates into the LAD and Circumflex, angiographically normal LAD: Large caliber vessel that reaches down around the apex there is ostial 20% stenosis followed by  a focal 70-80% lesion at the takeoff of SP 1, just prior to the bifurcation into D1.  There are then several smaller diagonal branches distally. There is a eccentric 40% lesion in the mid distal vessel and a 60% almost apical lesion.  D1: Moderate large caliber vessel that bifurcates proximally. This covers the anterolateral wall. Angiographically normal.  Left Circumflex: Large caliber, dominant vessel that gives rise to a moderate large caliber first OM branch followed by a diminutive marginal branch and then a small to moderate caliber OM 2 (previously PTCA vessel). Just prior to OM 2 there is a Focal 95-99% Stenosis in the Mid Circumflex. The OM 2 Has minimal restenosis at the PTCA site. Beyond OM 2, the vessel coursing in the AV groove where there is a focal 80% stenosis just prior to very small posterior lateral branch. The circumflex flexor bifurcates into the right posterior descending artery with a tandem branch and a smaller posterolateral branch.  OM1: Moderate caliber vessel with proximal 20% stenosis.  OM 2: Small moderate caliber vessel with widely patent prior PTCA site.   RCA: Moderate caliber, nondominant vessel with a focal ectatic segment followed by a diffusely diseased 60-70% stenosis (previously considered to be subtotal occlusion). The vessel terminates as 2 marginal branches.   After reviewing the initial angiography, the culprit lesion was thought to be 95-99% mid Circumflex lesion as well as the ~80% distal Circumflex lesion.Marland Kitchen  Preparation were made to proceed with PCI on these lesion.  The proximal-early mid LAD ~70% lesion was also concerning in tandem with the distal ~60% lesion.  The decision was made to do FFR guided PCI on the LAD if indicated.  Percutaneous Coronary Intervention:  Sheath exchanged for 6 Fr Guide: 6 Fr   XB 3.5 Guidewire: Prowater  Lesion #1: Mid circumflex 95-99% -- used to 0%; TIMI-3 flow pre-and post (required in one first due to the extent of  stenosis and it is impossible to visualize downstream with balloon catheter in place).  Predilation Balloon: Euphora 2.0 mm x 12 mm;   8 Atm x 20 Sec, -- following inflation, there was the appearance of a small localized dissection at the original lesion site. Therefore decision was made to stent this lesion first prior to lesion #2  The balloon was then advanced down to use for lesion #2 Stent: Promus Premier DES 3.0 mm x 16 mm;   Max inflation: 16 Atm x 30 Sec -- Final Diameter 3.2 mm   Lesion #2:  distal circumflex 80% stenosis reduced to 0% post-PCI. TIMI-3 flow in the Predilation Balloon: Euphora 2.0 mm x 12 mm;   6 Atm x 20 Sec,  Stent: Promus Premier DES 3.0 mm x 12 mm;   Max inflation: 14 Atm x 30 Sec, Final Diameter: 3.1 mm  Post deployment angiography in multiple views, with and without guidewire in place revealed appropriate stent deployment and lesion coverage.  There was no evidence of dissection or perforation. There was appropriate step up and step down prior to each lesion. Attention was then turned to the LAD.   Lesion #3:  Proximal-Mid LAD 70-80 % stenosis reduced to 0%. TIMI-3 flow pre-and post  Fractional Flow Reserve:  With the Prowater wire advanced down the LAD to the apex, the Navvus Acist FFR catheter was advanced beyond  the distal 60 % lesion. Adenosine infusion was administered for total of 2 minutes and, when the distal lesion appeared to be significant the catheter was pulled back across that lesion and there was no significant change. Therefore the culprit lesion was the proximal lesion with a physiologically significant FFR ratio 0.66.  Stent: Xience Alpine DES 3.5 mm x 15 mm;   Max inflation: 16 Atm x 45 Sec, Final Diameter: 3.75 mm  MEDICATIONS:  Anesthesia:  Local Lidocaine 2 ml  Sedation:  4 mg IV Versed, 100 mcg IV fentanyl ;   Omnipaque Contrast: 170 ml  Anticoagulation:  IV Heparin 4000 Units ;   For PCI : Angiomax Bolus &  drip  Anti-Platelet Agent:  Brilinta 180 mg  Radial Cocktail: 5 mg Verapamil, 400 mcg NTG, 2 ml 2% Lidocaine in 10 ml NS  IC NTG 400 mcg  FFR: IV Adenosine x 2 min @ 140 mcg/kg/min  PATIENT DISPOSITION:    The patient was transferred to the PACU holding area in a hemodynamicaly stable, chest pain free condition.  The patient tolerated the procedure well, and there were no complications.  EBL:   < 10 ml  The patient was stable before, during, and after the procedure.  POST-OPERATIVE DIAGNOSIS:    Severe 3 vessel CAD with 95-90% mid circumflex & distal circumflex 80% stenosis prior to LPL1, as well as 70-80% proximal to mid LAD at takeoff of the first septal perforator with a physiologically significant FFR of 0.66, and known chronic severe lesion in the mid nondominant RCA ~ 70% residual.   Successful 2 site PCI in the mid and distal circumflex as well as proximal LAD with drug-eluting stents as described above  Well-preserved LVEF of 60-65% with normal LVEDP of roughly 10 mmHg  PLAN OF CARE:  Return to [facility] for standard post Radial Catheterization Care with TR band removal.  Continue Angiomax until current bag complete   Dual Antiplatelet Therapy with aspirin plus Brilinta (may convert to Plavix after one 1 month) for a minimum of one year. However based on the amount of stents, would consider longer-term therapy   Continue aggressive risk factor modification with blood pressure control, lipid management (has a listed reaction to Lipitor)  Anticipate discharge tomorrow if he hemodynamically stable and pain-free.     Leonie Man, M.D., M.S. [facility] [address] [place], [place]  [address]  [phone]  11/10/2013 9:51 AM

## 2013-11-10 NOTE — Progress Notes (Signed)
Blood pressures today have been taken in the right leg secondary to right arm restriction secondary to TR band, and patient report that due to chemo/neuropathy, left arm pressures were not accurate.  Medicating for high BP this afternoon in consultation with Tarri Fuller, PA and Glenetta Hew, MD.  At this time, I took manual BP in each arm.  Right arm yielded 120/80, Left arm 121/75, Right leg (auto) 177/84.  Will proceed with routine VS Q 4 hours using left arm.

## 2013-11-10 NOTE — Interval H&P Note (Signed)
History and Physical Interval Note:  11/10/2013 7:38 AM  [name]  has presented today for surgery, with the diagnosis of cp with minimal exertion concerning for Class III Angina. Seen by Dr. Radford Pax & referred for cardiac cath. "Patient seen, and personally interviewed and examined. Agree with not as outlined by Angelena Form, PA-C. Patient has a history of chronically occluded RCA and BMS to the OM2 in the setting of NSTEMI in 2013. He has had recurrent CP in the past with normal nuclear stress testing most recently a week ago at [facility]. He now presents with recurrent CP with both typical and atypical components. Exam is benign. He is convinced that is pain is identical to his prior angina and MI and is very frustrated that no one at [facility] hospital would listen to him. He now continues to have intermittent chest pain with SOB and diaphoresis.  Will admit to tele bed for rule out. Continue BB/statin. Start ASA and IV Heparin gtt. Cycle cardiac enzymes. Make NPO after MN. Would recommend cath in am to redefine coronary anatomy since he continues to complain of CP despite normal nuclear stress test. Need to consider restenosis of OM stent." - Dr. Radford Pax.  Based on my discussion with the patient, he has had ongoing intermittent CP for over 1 week - initially relieved by NTG.  He has known CAD, & despite negative Nuc ST - his Sx are persistent.  To exclude LM disease +/- OM ISR, he is referred for cath to confirm or deny Angina vs. Non-cardiac CP.    The various methods of treatment have been discussed with the patient and family. After consideration of risks, benefits and other options for treatment, the patient has consented to  Procedure(s): LEFT HEART CATHETERIZATION WITH CORONARY ANGIOGRAM (N/A) as a surgical intervention .  The patient's history has been reviewed, patient examined, no change in status, stable for surgery.  I have reviewed the patient's chart and labs.  Questions were answered to  the patient's satisfaction.     HARDING,DAVID W  Cath Lab Visit (complete for each Cath Lab visit)  Clinical Evaluation Leading to the Procedure:   ACS: No.  Non-ACS:    Anginal Classification: CCS III  Anti-ischemic medical therapy: Minimal Therapy (1 class of medications)  Non-Invasive Test Results: Low-risk stress test findings: cardiac mortality <1%/year  Prior CABG: No previous CABG

## 2013-11-10 NOTE — Progress Notes (Signed)
   Called by RN re: high SBP & HA, Nausea & chest pressure post PCI x 3.  BP in ~170/110 mmHg range, CP free now.  TR band close to removal.  Plan - IV Labetalol 10 mg & increase PRN to 20mg  labetalol.    Lasix 40mg  x 1  Increase frequency of PRN Zofran. Would hold off on additional narcotics for now.  Will need BP medication regimen reassessed in AM prior to d/c.  Leonie Man, MD

## 2013-11-10 NOTE — Progress Notes (Signed)
*  PRELIMINARY RESULTS* Echocardiogram 2D Echocardiogram has been performed.  Leavy Cella 11/10/2013, 3:31 PM

## 2013-11-10 NOTE — Progress Notes (Signed)
ANTICOAGULATION CONSULT NOTE - Follow Up Consult  Pharmacy Consult for heparin Indication: chest pain/ACS  Labs:  Recent Labs  11/09/13 1301 11/09/13 2115 11/10/13 0050  HGB 16.5  --  15.0  HCT 46.3  --  43.6  PLT 142*  --  125*  LABPROT  --  13.2 13.3  INR  --  1.00 1.01  HEPARINUNFRC  --   --  0.43  CREATININE 0.97  --   --   TROPONINI  --  <0.30  --     Assessment/Plan:  53yo male therapeutic on heparin with initial dosing for CP. Will continue gtt at current rate and confirm stable with additional level.   Wynona Neat, PharmD, BCPS  11/10/2013,1:19 AM

## 2013-11-10 NOTE — Brief Op Note (Signed)
   NAME:  Bobby Floyd   MRN: [id number] DOB:  1961-03-05   ADMIT DATE: 11/09/2013  Brief Cardia Catheterization Note:   Indication: 1. Class III Angina 2. Known CAD - s/p POBA of OM2  Procedures: 1. LEFT HEART Catheterization with Native Coroanry Angiography via RIGHT RADIAL Artery access  6 Fr R Radial sheath: TIG 4.0 Cath for LCA & RCA Angio; Angled Pigtail for LV Gram & pressures..  TR BAND: 14 mL @ 0940 2. 2 SITE PCI OF MID 95-99% & DISTAL~80% DOMINANT CIRCUMFLEX -  PROMUS PREMIER DES 3.0 MM X 18 mid (~3.2 mm) , PROMUS PREMIER DES 3.0 mm x 12 mm distal (~3.0 mm)  6 Fr XB Guide - Prowater Wire - Euphora 2.0 mm x 12 mm predilation 3. FRACTIONAL FLOW RESERVE MEASUREMENT OF PROX & DISTAL LAD -- 0.66 FOR PROXIMAL ~70% LESION (NOT SIGNIFICANT FOR DISTAL 50-60%) 4. PCI OF PROX-MID LAD 70% LESION WITH XIENCE ALPINE DES 3.5 MM X 14 MM (3.75 MM)  Medications:  4 mg Versed IV; 100 mcg Fentanyl IV Radial Cocktail: 5 mg Verapamil, 400 mcg NTG, 2 ml 2% Lidocaine in 10 ml NS Heparin 4000 Units Angiomax bolus & gtt IC NTG 400 mcg IV Adenosine x 2 min @ 140 mcg/kg/min Brilinta 180 mg  Impression:  Severe 3 vessel CAD with 95-90% mid circumflex prior to takeoff of previously angioplastied OM 2 with a followup lesion in the distal circumflex 80% stenosis prior to LPL1, also 70% proximal to mid LAD at takeoff of the first septal perforator FFR of 0.66, known chronic severe lesion in the mid nondominant RCA ~ 70% residual.  Successful 2 site PCI in the mid and distal circumflex as well as proximal LAD as noted above the  Well-preserved LVEF of 60-65% with normal LVEDP of roughly 10 mmHg  Recommendations:  Return to [facility] for post radiochemotherapy. Continue Angiomax until current back completed  Dual antiplatelet therapy with aspirin  Plus Brilinta for first month and then consider converting to Plavix.  Continue aggressive risk modification with blood pressure control. Has a reaction  listed for Lipitor, may need to consider alternate lipid lowering agent.  Anticipate discharge tomorrow.  Full note to follow  Leonie Man, M.D., M.S. Interventional Cardiologist   Pager # [phone] 11/10/2013    11/10/2013 10:08 AM

## 2013-11-11 ENCOUNTER — Encounter (HOSPITAL_COMMUNITY): Payer: Self-pay | Admitting: Physician Assistant

## 2013-11-11 LAB — BASIC METABOLIC PANEL
Anion gap: 14 (ref 5–15)
BUN: 12 mg/dL (ref 6–23)
CHLORIDE: 100 meq/L (ref 96–112)
CO2: 25 meq/L (ref 19–32)
CREATININE: 0.95 mg/dL (ref 0.50–1.35)
Calcium: 9.7 mg/dL (ref 8.4–10.5)
GFR calc Af Amer: >90 mL/min (ref >90)
GFR calc non Af Amer: >90 mL/min (ref >90)
GLUCOSE: 106 mg/dL — AB (ref 70–99)
POTASSIUM: 3.6 meq/L — AB (ref 3.7–5.3)
Sodium: 139 mEq/L (ref 137–147)

## 2013-11-11 LAB — CBC
HEMATOCRIT: 49.6 % (ref 39.0–52.0)
HEMOGLOBIN: 17.9 g/dL — AB (ref 13.0–17.0)
MCH: 34 pg (ref 26.0–34.0)
MCHC: 36.1 g/dL — ABNORMAL HIGH (ref 30.0–36.0)
MCV: 94.1 fL (ref 78.0–100.0)
Platelets: 145 10*3/uL — ABNORMAL LOW (ref 150–400)
RBC: 5.27 MIL/uL (ref 4.22–5.81)
RDW: 12.9 % (ref 11.5–15.5)
WBC: 9.4 10*3/uL (ref 4.0–10.5)

## 2013-11-11 MED ORDER — TICAGRELOR 90 MG PO TABS: 90.0000 mg | ORAL_TABLET | Freq: Two times a day (BID) | ORAL | Status: DC

## 2013-11-11 MED FILL — Sodium Chloride IV Soln 0.9%: INTRAVENOUS | Qty: 50 | Status: AC

## 2013-11-11 NOTE — Discharge Instructions (Signed)
PLEASE REMEMBER TO BRING ALL OF YOUR MEDICATIONS TO EACH OF YOUR FOLLOW-UP OFFICE VISITS. ° °PLEASE ATTEND ALL SCHEDULED FOLLOW-UP APPOINTMENTS.  ° °Activity: Increase activity slowly as tolerated. You may shower, but no soaking baths (or swimming) for 1 week. No driving for 2 days. No lifting over 5 lbs for 1 week. No sexual activity for 1 week.  ° °You May Return to Work: in 1 week (if applicable) ° °Wound Care: You may wash cath site gently with soap and water. Keep cath site clean and dry. If you notice pain, swelling, bleeding or pus at your cath site, please call [phone]. ° ° ° °Cardiac Cath Site Care °Refer to this sheet in the next few weeks. These instructions provide you with information on caring for yourself after your procedure. Your caregiver may also give you more specific instructions. Your treatment has been planned according to current medical practices, but problems sometimes occur. Call your caregiver if you have any problems or questions after your procedure. °HOME CARE INSTRUCTIONS °· You may shower 24 hours after the procedure. Remove the bandage (dressing) and gently wash the site with plain soap and water. Gently pat the site dry.  °· Do not apply powder or lotion to the site.  °· Do not sit in a bathtub, swimming pool, or whirlpool for 5 to 7 days.  °· No bending, squatting, or lifting anything over 10 pounds (4.5 kg) as directed by your caregiver.  °· Inspect the site at least twice daily.  °· Do not drive home if you are discharged the same day of the procedure. Have someone else drive you.  °· You may drive 24 hours after the procedure unless otherwise instructed by your caregiver.  °What to expect: °· Any bruising will usually fade within 1 to 2 weeks.  °· Blood that collects in the tissue (hematoma) may be painful to the touch. It should usually decrease in size and tenderness within 1 to 2 weeks.  °SEEK IMMEDIATE MEDICAL CARE IF: °· You have unusual pain at the site or down the  affected limb.  °· You have redness, warmth, swelling, or pain at the site.  °· You have drainage (other than a small amount of blood on the dressing).  °· You have chills.  °· You have a fever or persistent symptoms for more than 72 hours.  °· You have a fever and your symptoms suddenly get worse.  °· Your leg becomes pale, cool, tingly, or numb.  °· You have heavy bleeding from the site. Hold pressure on the site.  °Document Released: 03/31/2010 Document Revised: 02/15/2011 Document Reviewed: 03/31/2010 °ExitCare® Patient Information ©2012 [employer]. ° °

## 2013-11-11 NOTE — Progress Notes (Signed)
Patient Name: Bobby Floyd Date of Encounter: 11/11/2013  Principal Problem:   Angina, class III Active Problems:   H/O Non-ST elevation myocardial infarction (NSTEMI), subsequent episode of care   Chest pain   CAD S/P percutaneous coronary angioplasty: POBA OM & known RCA CTO   S/P coronary artery stent placement - DES to dominant CFX 11/10/2013 CFX - PROMUS PREMIER DES 3.0 MM X 18 mid, PROMUS PREMIER DES 3.0 mm x 12 mm distal, LAD - XIENCE ALPINE DES 3.5 MM X 14 MM    Patient Profile: 53 yo male w/ hx CAD, BMS CFX, CTO RCA tonsillar CA s/p chemo/rad/resection, GERD, HLD, long term tobacco abuse (quit last week)   SUBJECTIVE: No chest pain overnight.  OBJECTIVE Filed Vitals:   11/11/13 0037 11/11/13 0135 11/11/13 0547 11/11/13 0550  BP:   125/84 125/84  Pulse:   79 72  Temp:   97.9 F (36.6 C)   TempSrc:   Oral   Resp:   20   Height:      Weight: 184 lb 8.4 oz (83.7 kg) 184 lb 8.4 oz (83.7 kg)    SpO2:   99% 100%    Intake/Output Summary (Last 24 hours) at 11/11/13 0705 Last data filed at 11/11/13 0547  Gross per 24 hour  Intake    240 ml  Output   3850 ml  Net  -3610 ml   Filed Weights   11/10/13 0500 11/11/13 0037 11/11/13 0135  Weight: 184 lb 15.5 oz (83.9 kg) 184 lb 8.4 oz (83.7 kg) 184 lb 8.4 oz (83.7 kg)    PHYSICAL EXAM General: Well developed, well nourished, male in no acute distress. Head: Normocephalic, atraumatic.  Neck: Supple without bruits, JVD not elevated  Lungs:  Resp regular and unlabored, CTA. Heart: RRR, S1, S2, no S3, S4, or murmur; no rub. Abdomen: Soft, non-tender, non-distended, BS + x 4.  Extremities: No clubbing, cyanosis, no edema. Right radial cath site no ecchymosis/hematoma Neuro: Alert and oriented X 3. Moves all extremities spontaneously. Psych: Normal affect.  LABS: CBC:  Recent Labs  11/09/13 1301 11/10/13 0050  WBC 7.4 5.8  HGB 16.5 15.0  HCT 46.3 43.6  MCV 93.0 95.6  PLT 142* 125*   INR:  Recent  Labs  11/10/13 0050  INR 8.50   Basic Metabolic Panel:  Recent Labs  11/09/13 1301 11/09/13 2115 11/10/13 0050  NA 141  --  142  K 4.3  --  4.1  CL 104  --  106  CO2 26  --  29  GLUCOSE 102*  --  111*  BUN 15  --  15  CREATININE 0.97  --  1.18  CALCIUM 9.6  --  9.1  MG  --  2.1  --    Liver Function Tests:  Recent Labs  11/10/13 0050  AST 12  ALT 10  ALKPHOS 60  BILITOT 0.5  PROT 5.7*  ALBUMIN 3.4*   Cardiac Enzymes:  Recent Labs  11/09/13 2115 11/10/13 0050 11/10/13 1040  TROPONINI <0.30 <0.30 <0.30    Recent Labs  11/09/13 1332  TROPIPOC 0.04   BNP: Pro B Natriuretic peptide (BNP)  Date/Time Value Ref Range Status  06/24/2011  1:00 PM 47.8  0 - 125 pg/mL Final   Hemoglobin A1C:  Recent Labs  11/09/13 2115  HGBA1C 5.8*   Fasting Lipid Panel:  Recent Labs  11/10/13 0050  CHOL 172  HDL 52  LDLCALC 105*  TRIG 76  CHOLHDL 3.3   Thyroid Function Tests:  Recent Labs  11/09/13 2115  TSH 6.510*   TELE:  SR      ECHO: 11/10/2013  Study Conclusions - Left ventricle: The cavity size was normal. There was moderate concentric hypertrophy. Systolic function was normal. The estimated ejection fraction was in the range of 55% to 60%. Wall motion was normal; there were no regional wall motion abnormalities. Diastolic function is indeterminate. - Left atrium: The atrium was normal in size. - Right atrium: The atrium was mildly dilated.   Radiology/Studies: Dg Chest 2 View 11/09/2013  CLINICAL DATA:  Chest pain and cough  EXAM: CHEST  2 VIEW  COMPARISON:  10/30/2013  FINDINGS: The heart size and mediastinal contours are within normal limits. Both lungs are clear. The visualized skeletal structures are unremarkable.  IMPRESSION: No active cardiopulmonary disease.   Electronically Signed   By: Skipper Cliche M.D.   On: 11/09/2013 13:57   Current Medications:  . aspirin EC  81 mg Oral Daily  . diazepam  20 mg Oral QHS  . levothyroxine  100  mcg Oral Daily  . metoprolol tartrate  12.5 mg Oral BID  . pantoprazole  40 mg Oral BID  . sodium chloride  3 mL Intravenous Q12H  . ticagrelor  90 mg Oral BID  . traZODone  150 mg Oral QHS   . nitroGLYCERIN      ASSESSMENT AND PLAN: Principal Problem:   Angina, class III - S/P DES to CFX x 2 and LAD x 1, tolerated procedure well. Continue ticagrelor, ASA, BB, has not tolerated statin in the past. LDL is up, MD advise on referral to lipid clinic.  Active Problems:   H/O Non-ST elevation myocardial infarction (NSTEMI), subsequent episode of care - see above    Chest pain - see above    CAD S/P percutaneous coronary angioplasty: POBA OM & known RCA CTO    S/P coronary artery stent placement - DES x 2 to dominant CFX and DES to LAD 11/10/2013 - see above  Plan - ambulate and d/c home if does well.  Signed, Rosaria Ferries , PA-C 7:05 AM 11/11/2013  I have personally seen and examined this patient with Rosaria Ferries, PA-C. I agree with the assessment and plan as outlined above. He is stable post PCI yesterday per Dr. Ellyn Hack. Discharge home today. Will continue ASA and Brilinta for at least one year. He will need the free month supply of Brilinta. He can f/u in the office with Dr. Aundra Dubin or Richardson Dopp, PA-C in 2-3 weeks and if unable to afford Brilinta, can be changed to Plavix once daily.   MCALHANY,CHRISTOPHER 11/11/2013 7:44 AM

## 2013-11-11 NOTE — Discharge Summary (Signed)
See full note this am. cdm 

## 2013-11-11 NOTE — Progress Notes (Signed)
Patient refused morning labs even after explantation of why we need them.

## 2013-11-11 NOTE — Progress Notes (Signed)
[phone] Pt walked with RN this morning without CP so did not walk. Education completed with pt who voiced understanding. Gave smoking cessation handout and encouraged pt to call 1800quitnow as needed. Pt using electronic cigarette and discussed with pt not FDA approved and to be careful with how much nicotine he is getting with this. 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 Discussed importance of brilinta and gave booklet. Discussed CRP 2 and pt gave permission for referral to [place] program. Graylon Good RN BSN 11/11/2013 8:55 AM

## 2013-11-11 NOTE — Discharge Summary (Signed)
CARDIOLOGY DISCHARGE SUMMARY   Patient ID: Bobby Floyd MRN: [id number] DOB/AGE: 51/01/1961 51 y.o.  Admit date: 11/09/2013 Discharge date: 11/11/2013  PCP: Hilbert Corrigan Primary Cardiologist: was Dr. Lia Foyer, now Dr. Aundra Dubin (has also been seen at [facility])  Primary Discharge Diagnosis:    Angina, class III - CFX - PROMUS PREMIER DES 3.0 MM X 18 mid, PROMUS PREMIER DES 3.0 mm x 12 mm distal, LAD - XIENCE ALPINE DES 3.5 MM X 14 MM   Secondary Discharge Diagnosis:    H/O Non-ST elevation myocardial infarction (NSTEMI), subsequent episode of care   CAD S/P percutaneous coronary angioplasty: BMS OM & known RCA CTO   S/P coronary artery stent placement - DES x 2 to dominant CFX and DES to LAD 11/10/2013  PROCEDURES PERFORMED:  1. LEFT HEART Catheterization with Native Coroanry Angiography via RIGHT RADIAL Artery access 2. 2 SITE PERCUTANEOUS CORONARY INTERVENTION OF MID 95-99% & DISTAL~80% DOMINANT CIRCUMFLEX - PROMUS PREMIER DES 3.0 MM X 18 mid (~3.2 mm) , PROMUS PREMIER DES 3.0 mm x 12 mm distal (~3.0 mm) 3. PERCUTANEOUS CORONARY INTERVENTION OF PROX-MID LAD 70% LESION WITH XIENCE ALPINE DES 3.5 MM X 14 MM (3.75 MM) 4. 2 D Echocardiogram  Hospital Course: Bobby Floyd is a 53 y.o. male with a history of CAD. He was recently hospitalized in [facility] and a Lexi scan Myoview was without ischemia. He had recurrent chest pain and also call. He saw his primary care physician who sent him to the emergency room out of concern for his chest pain and was also concerned about the possibility for pneumonia. He was admitted for further evaluation and treatment.  Bobby Floyd last week. His chest x-ray did not infiltrate, just COPD. The chest pain was worse with deep inspiration, so a 2-D echocardiogram was performed to evaluate for effusion. The results are below. It was negative for effusion and his EF is preserved.  He was on aspirin and a beta blocker prior to  admission. These were continued. He had been intolerant to statins in the past. A lipid profile plus LFTs were checked, results are below. His LDL is elevated, but he is currently refusing a statin at this time. He had thrombocytopenia on admission with a platelet count of 125. This is in line with previous values in his not having any current bleeding issues. His TSH was mildly elevated, and he is to followup with primary care for this. No change in his Synthroid dose was indicated.  His symptoms were concerning for progressive angina, so he was taken to the cath lab on 09/01. The results are below but he required drug-eluting stents x 2 to the circumflex and an additional drug-eluting stent to the LAD. He tolerated the procedure well. He was placed on Brilinta and we wish to continue this but he may have trouble affording it, so may have to be switched to Plavix after 30 days.  On 09/02, he was seen by Dr. Angelena Form and all data were reviewed. He is encouraged to continue with smoking cessation, and is encouraged to be compliant with medications and followup appointments. However, the patient states he has difficulty affording his co-pay for appointments as well as a higher co-pay for medications. He also states he was told that he has stage IV cancer and a life expectancy of less than 6 months, however the note from his otolaryngologist 07/2013, states that he completed chemotherapy and radiation in 2009, no evidence of disease on  exam. Will have patient clarify these issues at his next appointment, but encouraged him to continue heart-healthy lifestyle modifications and compliance with medications. No further inpatient workup was indicated and he is considered stable for discharge, to follow up as an outpatient.  Labs:  Lab Results  Component Value Date   WBC 5.8 11/10/2013   HGB 15.0 11/10/2013   HCT 43.6 11/10/2013   MCV 95.6 11/10/2013   PLT 125* 11/10/2013     Recent Labs Lab 11/10/13 0050  NA 142   K 4.1  CL 106  CO2 29  BUN 15  CREATININE 1.18  CALCIUM 9.1  PROT 5.7*  BILITOT 0.5  ALKPHOS 60  ALT 10  AST 12  GLUCOSE 111*    Recent Labs  11/09/13 2115 11/10/13 0050 11/10/13 1040  TROPONINI <0.30 <0.30 <0.30   Lipid Panel     Component Value Date/Time   CHOL 172 11/10/2013 0050   TRIG 76 11/10/2013 0050   HDL 52 11/10/2013 0050   CHOLHDL 3.3 11/10/2013 0050   VLDL 15 11/10/2013 0050   LDLCALC 105* 11/10/2013 0050    Recent Labs  11/10/13 0050  INR 1.01   Radiology/Studies:  Dg Chest 2 View  11/09/2013 CLINICAL DATA: Chest pain and cough EXAM: CHEST 2 VIEW COMPARISON: 10/30/2013 FINDINGS: The heart size and mediastinal contours are within normal limits. Both lungs are clear. The visualized skeletal structures are unremarkable. IMPRESSION: No active cardiopulmonary disease. Electronically Signed By: Skipper Cliche M.D. On: 11/09/2013 13:57   Cardiac Cath: 11/10/2013 FINDINGS:  Hemodynamics:  Central Aortic Pressure / Mean: 90/62/76 mmHg  Left Ventricular Pressure / LVEDP: 93/5/10 mmHg Left Ventriculography:  EF: 60-65 %  Wall Motion: Normal Coronary Anatomy:  Dominance: Left Left Main: Large caliber vessel that bifurcates into the LAD and Circumflex, angiographically normal LAD: Large caliber vessel that reaches down around the apex there is ostial 20% stenosis followed by a focal 70-80% lesion at the takeoff of SP 1, just prior to the bifurcation into D1. There are then several smaller diagonal branches distally. There is a eccentric 40% lesion in the mid distal vessel and a 60% almost apical lesion.  D1: Moderate large caliber vessel that bifurcates proximally. This covers the anterolateral wall. Angiographically normal. Left Circumflex: Large caliber, dominant vessel that gives rise to a moderate large caliber first OM branch followed by a diminutive marginal branch and then a small to moderate caliber OM 2 (previously PTCA vessel). Just prior to OM 2 there is a Focal  95-99% Stenosis in the Mid Circumflex. The OM 2 Has minimal restenosis at the PTCA site. Beyond OM 2, the vessel coursing in the AV groove where there is a focal 80% stenosis just prior to very small posterior lateral branch. The circumflex flexor bifurcates into the right posterior descending artery with a tandem branch and a smaller posterolateral branch.  OM1: Moderate caliber vessel with proximal 20% stenosis.  OM 2: Small moderate caliber vessel with widely patent prior PTCA site. RCA: Moderate caliber, nondominant vessel with a focal ectatic segment followed by a diffusely diseased 60-70% stenosis (previously considered to be subtotal occlusion). The vessel terminates as 2 marginal branches.  After reviewing the initial angiography, the culprit lesion was thought to be 95-99% mid Circumflex lesion as well as the ~80% distal Circumflex lesion.Marland Kitchen Preparation were made to proceed with PCI on these lesion. The proximal-early mid LAD ~70% lesion was also concerning in tandem with the distal ~60% lesion. The decision was made to do FFR  guided PCI on the LAD if indicated.  Percutaneous Coronary Intervention: Sheath exchanged for 6 Fr  Guide: 6 Fr XB 3.5 Guidewire: Prowater  Lesion #1: Mid circumflex 95-99% -- used to 0%; TIMI-3 flow pre-and post (required in one first due to the extent of stenosis and it is impossible to visualize downstream with balloon catheter in place).  Predilation Balloon: Euphora 2.0 mm x 12 mm;  8 Atm x 20 Sec, -- following inflation, there was the appearance of a small localized dissection at the original lesion site. Therefore decision was made to stent this lesion first prior to lesion #2  The balloon was then advanced down to use for lesion #2 Stent: Promus Premier DES 3.0 mm x 16 mm;  Max inflation: 16 Atm x 30 Sec -- Final Diameter 3.2 mm Lesion #2: distal circumflex 80% stenosis reduced to 0% post-PCI. TIMI-3 flow  Predilation Balloon: Euphora 2.0 mm x 12 mm;  6 Atm x 20  Sec,  Stent: Promus Premier DES 3.0 mm x 12 mm;  Max inflation: 14 Atm x 30 Sec, Final Diameter: 3.1 mm Post deployment angiography in multiple views, with and without guidewire in place revealed appropriate stent deployment and lesion coverage. There was no evidence of dissection or perforation. There was appropriate step up and step down prior to each lesion.  Attention was then turned to the LAD.  Lesion #3: Proximal-Mid LAD 70-80 % stenosis reduced to 0%. TIMI-3 flow Fractional Flow Reserve: With the Prowater wire advanced down the LAD to the apex, the Navvus Acist FFR catheter was advanced beyond the distal 60 % lesion. Adenosine infusion was administered for total of 2 minutes and, when the distal lesion appeared to be significant the catheter was pulled back across that lesion and there was no significant change. Therefore the culprit lesion was the proximal lesion with a physiologically significant FFR ratio 0.66.  Stent: Xience Alpine DES 3.5 mm x 15 mm;  Max inflation: 16 Atm x 45 Sec, Final Diameter: 3.75 mm POST-OPERATIVE DIAGNOSIS:  Severe 3 vessel CAD with 95-90% mid circumflex & distal circumflex 80% stenosis prior to LPL1, as well as 70-80% proximal to mid LAD at takeoff of the first septal perforator with a physiologically significant FFR of 0.66, and known chronic severe lesion in the mid nondominant RCA ~ 70% residual.  Successful 2 site PCI in the mid and distal circumflex as well as proximal LAD with drug-eluting stents as described above  Well-preserved LVEF of 60-65% with normal LVEDP of roughly 10 mmHg PLAN OF CARE:  Return to [facility] for standard post Radial Catheterization Care with TR band removal.  Continue Angiomax until current bag complete  Dual Antiplatelet Therapy with aspirin plus Brilinta (may convert to Plavix after one 1 month) for a minimum of one year. However based on the amount of stents, would consider longer-term therapy  Continue aggressive risk factor  modification with blood pressure control, lipid management (has a listed reaction to Lipitor)  Anticipate discharge tomorrow if he hemodynamically stable and pain-free.  EKG: 11/11/2013 SR, no ST elevation Vent. rate 68 BPM PR interval 148 ms QRS duration 90 ms QT/QTc 416/442 ms P-R-T axes 62 69 63  ECHO: 11/10/2013  Study Conclusions - Left ventricle: The cavity size was normal. There was moderate concentric hypertrophy. Systolic function was normal. The estimated ejection fraction was in the range of 55% to 60%. Wall motion was normal; there were no regional wall motion abnormalities. Diastolic function is indeterminate. - Left atrium: The  atrium was normal in size. - Right atrium: The atrium was mildly dilated.    FOLLOW UP PLANS AND APPOINTMENTS Allergies  Allergen Reactions  . Lipitor [Atorvastatin] Swelling and Other (See Comments)    Urination problems, myalgias also  . Other Other (See Comments)    Possible resistance to all narcotic pain meds-dilaudid might work  . Propofol Other (See Comments)    "violent"  . Benadryl [Diphenhydramine Hcl] Swelling    Hyperactivity, very   . Ondansetron Other (See Comments)    Headaches  . Promethazine Hcl Nausea And Vomiting     Medication List         aspirin 81 MG tablet  Take 81 mg by mouth daily.     diazepam 10 MG tablet  Commonly known as:  VALIUM  Take 20 mg by mouth at bedtime.     levothyroxine 100 MCG tablet  Commonly known as:  SYNTHROID, LEVOTHROID  Take 100 mcg by mouth daily.     metoprolol tartrate 25 MG tablet  Commonly known as:  LOPRESSOR  Take 0.5 tablets (12.5 mg total) by mouth 2 (two) times daily.     nitroGLYCERIN 0.4 MG SL tablet  Commonly known as:  NITROSTAT  Place 1 tablet (0.4 mg total) under the tongue every 5 (five) minutes x 3 doses as needed for chest pain.     pantoprazole 40 MG tablet  Commonly known as:  PROTONIX  Take 1 tablet (40 mg total) by mouth 2 (two) times daily.      ticagrelor 90 MG Tabs tablet  Commonly known as:  BRILINTA  Take 1 tablet (90 mg total) by mouth 2 (two) times daily.     traZODone 150 MG tablet  Commonly known as:  DESYREL  Take 150 mg by mouth at bedtime.         Follow-up Information   Follow up with Richardson Dopp, PA-C On 11/24/2013. (at 3:20 pm)    Specialty:  Physician Assistant   Contact information:   [address] [address] [phone]       BRING ALL MEDICATIONS WITH YOU TO FOLLOW UP APPOINTMENTS  Time spent with patient to include physician time: 48 min Signed: Rosaria Ferries, PA-C 11/11/2013, 8:26 AM Co-Sign MD

## 2013-11-24 ENCOUNTER — Ambulatory Visit (INDEPENDENT_AMBULATORY_CARE_PROVIDER_SITE_OTHER): Payer: Commercial Managed Care - HMO | Admitting: Physician Assistant

## 2013-11-24 ENCOUNTER — Encounter: Payer: Self-pay | Admitting: Physician Assistant

## 2013-11-24 VITALS — BP 120/85 | HR 71 | Ht 74.0 in | Wt 187.0 lb

## 2013-11-24 DIAGNOSIS — I2510 Atherosclerotic heart disease of native coronary artery without angina pectoris: Secondary | ICD-10-CM | POA: Diagnosis not present

## 2013-11-24 DIAGNOSIS — F17200 Nicotine dependence, unspecified, uncomplicated: Secondary | ICD-10-CM

## 2013-11-24 DIAGNOSIS — E7800 Pure hypercholesterolemia, unspecified: Secondary | ICD-10-CM

## 2013-11-24 DIAGNOSIS — Z9861 Coronary angioplasty status: Secondary | ICD-10-CM

## 2013-11-24 DIAGNOSIS — Z720 Tobacco use: Secondary | ICD-10-CM

## 2013-11-24 MED ORDER — CLOPIDOGREL BISULFATE 75 MG PO TABS: 75.0000 mg | ORAL_TABLET | Freq: Every day | ORAL | Status: DC

## 2013-11-24 NOTE — Patient Instructions (Signed)
A NEW RX FOR PLAVIX 75 MG 1 TABLET DAILY HAS BEEN SENT IN; YOU WILL ONLY START THE PLAVIX IF YOU DO NOT QUALIFY FOR THE ASSISTANCE PROGRAM FOR THE [place].  YOU HAVE BEEN REFERRED TO THE LIPID CLINIC  Your physician recommends that you schedule a follow-up appointment in: 2-3 MONTHS WITH DR. Aundra Dubin

## 2013-11-24 NOTE — Progress Notes (Signed)
Cardiology Office Note    Date:  11/24/2013   ID:  Bobby Floyd, DOB 1960/08/04, MRN [id number]  PCP:  Rochel Brome, MD  Cardiologist:  Dr. [name]    History of Present Illness: Bobby Floyd is a 53 y.o. male with a hx of CAD s/p NSTEMI 4/13 tx with POBA to OM2, tonsillar CA, GERD, HL.  Previously followed by Dr.  Bing Quarry. He was admitted 8/31-9/2 with [place].  He had been recently admitted to [facility] with chest pain and inpatient myoview was neg for ischemia.  He had ongoing pain and presented to [facility] for evaluation.  Echo demonstrated normal LVF.  CEs were neg.  LHC demonstrated 3 v CAD and he underwent PCI with DES x 2 to CFX and DES x 1 to LAD.  DAP therapy with Brilinta and ASA was recommended x 1 year minimum.    He returns for FU.  He is doing much better.  The patient denies chest pain, shortness of breath, syncope, orthopnea, PND or significant pedal edema. Walking 3 miles a day.     Studies:  - LHC (9/15):  EF 60-65%, ostial LAD 20% followed by 70-80%, mid LAD 40%, apical LAD 60%, mid CFX 95-99%, AVCFX 80%, prox OM1 20%, OM2 PTCA site patent, RCA 60-70% >>> PCI:  Promus Premier DES x 2 to CFX and Xience Alpine DES to prox to mid LAD  - Echo (11/10/13):  Mod LVH, EF 55-60%, no RWMA, mild RAE.  - Nuclear (8/14):  Apical thinning, no ischemia, EF 50%; NORMAL   Recent Labs/Images: 11/09/2013: TSH 6.510*  11/10/2013: ALT 10; HDL Cholesterol by NMR 52; LDL (calc) 105*  11/11/2013: Creatinine 0.95; Hemoglobin 17.9*; Potassium 3.6*     Wt Readings from Last 3 Encounters:  11/24/13 187 lb (84.823 kg)  11/11/13 184 lb 8.4 oz (83.7 kg)  11/11/13 184 lb 8.4 oz (83.7 kg)     Past Medical History  Diagnosis Date  . Hypothyroidism   . Degenerative joint disease of cervical spine   . GERD (gastroesophageal reflux disease)   . Coronary artery disease     a. NSTEMI 4/13 => LHC:  oLAD 40, then 40, mOM1 50-60, mOM2 occluded (culprit), AVCFX 30, RCA occluded, EF  50% => PCI:  BMS to OM2  . Dyslipidemia (high LDL; low HDL)   . Chemotherapy-induced neuropathy   . Myocardial infarction 2004; 2007; 2013  . Anemia   . History of blood transfusion 2009    "after throat cancer OR" (10/10/2012)  . 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 Depression   . Basal cell carcinoma of skin   . Melanoma     "right hand or forearm" (10/10/2012)  . Tonsillar cancer     Squamous cell, on the left, stage IV; radiation therapy 10/21/07-12/11/07  . Squamous cell carcinoma     "forearms, hands, head, nose" (10/10/2012)  . Complication of anesthesia     woke up violent a couple of times "    Current Outpatient Prescriptions  Medication Sig Dispense Refill  . aspirin 81 MG tablet Take 81 mg by mouth daily.      . diazepam (VALIUM) 10 MG tablet Take 20 mg by mouth at bedtime.      Marland Kitchen levothyroxine (SYNTHROID, LEVOTHROID) 100 MCG tablet Take 100 mcg by mouth daily.        . metoprolol tartrate (LOPRESSOR) 25 MG tablet Take 0.5 tablets (  12.5 mg total) by mouth 2 (two) times daily.  15 tablet  0  . nitroGLYCERIN (NITROSTAT) 0.4 MG SL tablet Place 1 tablet (0.4 mg total) under the tongue every 5 (five) minutes x 3 doses as needed for chest pain.  25 tablet  12  . pantoprazole (PROTONIX) 40 MG tablet Take 1 tablet (40 mg total) by mouth 2 (two) times daily.  60 tablet  3  . ticagrelor (BRILINTA) 90 MG TABS tablet Take 1 tablet (90 mg total) by mouth 2 (two) times daily.  60 tablet  11  . traZODone (DESYREL) 150 MG tablet Take 150 mg by mouth at bedtime.        No current facility-administered medications for this visit.     Allergies:   Lipitor; Other; Propofol; Benadryl; Ondansetron; and Promethazine hcl   Social History:  The patient  reports that he quit smoking about 4 weeks ago. His smoking use included Cigarettes. He has a 30 pack-year smoking history. He has never used smokeless tobacco. He reports that he uses illicit drugs  (Marijuana). He reports that he does not drink alcohol.   Family History:  The patient's family history includes Cirrhosis in his maternal grandfather and maternal uncle; Colon cancer in his maternal uncle; Goiter in his mother; Heart attack in his mother and paternal grandmother; Heart disease in his maternal grandmother; Hypothyroidism in his mother.   ROS:  Please see the history of present illness.      All other systems reviewed and negative.   PHYSICAL EXAM: VS:  BP 120/85  Pulse 71  Ht 6\' 2"  (1.88 m)  Wt 187 lb (84.823 kg)  BMI 24.00 kg/m2 Well nourished, well developed, in no acute distress HEENT: normal Neck: no JVD Cardiac:  normal S1, S2; RRR; no murmur Lungs:  clear to auscultation bilaterally, no wheezing, rhonchi or rales Abd: soft, nontender, no hepatomegaly Ext: no edemaright wrist without hematoma or mass  Skin: warm and dry Neuro:  CNs 2-12 intact, no focal abnormalities noted  EKG:  NSR, HR 71, normal axis, no ST changes      ASSESSMENT AND PLAN:  1. Coronary artery disease:  No further angina.  We discussed the importance of dual antiplatelet therapy. He does not think he can afford Brilinta.  We will see if we can get assistance for him.  I will also give him samples.  If he does not get assistance, he can change to Plavix 75 mg QD after 30 days.  I will send a Rx for him.  Continue beta blocker.   2. Hypercholesterolemia:  He is intol to statins.  He seems to be describing an allergic reaction.  I will refer him to the Lipid Clinic.  He may be a candidate for one of the [employer] trials.   3. Tobacco use :  He has stopped smoking.   Disposition:  FU with Dr. [name] in 2-3 mos.    Signed, Versie Starks, MHS 11/24/2013 4:01 PM    [facility] [address], [place], [place]  [address] Phone: [phone]; Fax: [phone]

## 2013-11-30 ENCOUNTER — Ambulatory Visit: Payer: Self-pay | Admitting: Pharmacist

## 2014-02-18 ENCOUNTER — Encounter (HOSPITAL_COMMUNITY): Payer: Self-pay | Admitting: Cardiology

## 2014-02-26 ENCOUNTER — Encounter: Payer: Self-pay | Admitting: Cardiology

## 2014-02-26 ENCOUNTER — Ambulatory Visit (INDEPENDENT_AMBULATORY_CARE_PROVIDER_SITE_OTHER): Payer: Commercial Managed Care - HMO | Admitting: Cardiology

## 2014-02-26 VITALS — BP 104/74 | HR 77 | Ht 74.0 in | Wt 191.8 lb

## 2014-02-26 DIAGNOSIS — E7800 Pure hypercholesterolemia, unspecified: Secondary | ICD-10-CM

## 2014-02-26 DIAGNOSIS — I2510 Atherosclerotic heart disease of native coronary artery without angina pectoris: Secondary | ICD-10-CM

## 2014-02-26 DIAGNOSIS — Z9861 Coronary angioplasty status: Secondary | ICD-10-CM

## 2014-02-26 MED ORDER — PRAVASTATIN SODIUM 40 MG PO TABS
40.0000 mg | ORAL_TABLET | Freq: Every evening | ORAL | Status: DC
Start: 2014-02-26 — End: 2014-12-01

## 2014-02-26 NOTE — Patient Instructions (Signed)
Start pravastatin 40mg  daily in the evening.  Your physician recommends that you return for a FASTING lipid profile /liver profile in 2 months.  Your physician wants you to follow-up in: 4 months with Dr Aundra Dubin. (April 2016). You will receive a reminder letter in the mail two months in advance. If you don't receive a letter, please call our office to schedule the follow-up appointment.

## 2014-02-28 NOTE — Progress Notes (Signed)
Patient ID: Bobby Floyd, male   DOB: 1961-02-09, 53 y.o.   MRN: [id number] PCP: Dr Tobie Poet Tia Alert)  53 yo with history of extensive CAD presents for cardiology followup.  He was seen by Dr Lia Foyer in the past and is seen by me for the first time today.  He was recently admitted in 9/15 with unstable angina and had DES to the pLAD, mLCx, and dLCx.  Echo showed EF 55-60%.  He has not been on a statin due to myalgias with atorvastatin.    He has been doing well recently.  No chest pain.  He walks for 1.5 hours/day.  No exertional dyspnea.  No orthopnea, PND, lightheadedness, or palpitations.   Labs (9/15): K 3.6, creatinine 0.95, HCT 49.6  PMH: 1. C-spine surgery 2. Tonsillar cancer 3. GERD 4. Hyperlipidemia: Myalgias with Lipitor.  5. Depression 6. Melanoma 7. CAD: 1st MI at 53.  NSTEMI 4/13 with PTCA OM2 (nondominant right was subtotally occluded).  Unstable angina 9/15 with LHC showing 70-80% pLAD, 60% apical LAD, 95% mLCx, 80% dLCx, diffuse moderate disease of nondominant RCA.  Patient had DES to pLAD, mLCx, and dLCx.   Echo (9/15) with EF 55-60%, no rWMAs.    SH: Quit smoking.  Lives in [place].  FH: Grandmother with CABG at 11, uncles/aunts with MIs.    ROS: All systems reviewed and negative except as per HPI.   Current Outpatient Prescriptions  Medication Sig Dispense Refill  . aspirin 81 MG tablet Take 81 mg by mouth daily.    . clopidogrel (PLAVIX) 75 MG tablet Take 1 tablet (75 mg total) by mouth daily. 30 tablet 11  . diazepam (VALIUM) 10 MG tablet Take 20 mg by mouth at bedtime.    Marland Kitchen levothyroxine (SYNTHROID, LEVOTHROID) 100 MCG tablet Take 100 mcg by mouth daily.      . metoprolol tartrate (LOPRESSOR) 25 MG tablet Take 0.5 tablets (12.5 mg total) by mouth 2 (two) times daily. 15 tablet 0  . nitroGLYCERIN (NITROSTAT) 0.4 MG SL tablet Place 1 tablet (0.4 mg total) under the tongue every 5 (five) minutes x 3 doses as needed for chest pain. 25 tablet 12  . pantoprazole  (PROTONIX) 40 MG tablet Take 1 tablet (40 mg total) by mouth 2 (two) times daily. 60 tablet 3  . traZODone (DESYREL) 150 MG tablet Take 150 mg by mouth at bedtime.     . pravastatin (PRAVACHOL) 40 MG tablet Take 1 tablet (40 mg total) by mouth every evening. 30 tablet 3   No current facility-administered medications for this visit.    BP 104/74 mmHg  Pulse 77  Ht 6\' 2"  (1.88 m)  Wt 191 lb 12.8 oz (87 kg)  BMI 24.62 kg/m2  SpO2 97% General: NAD Neck: No JVD, no thyromegaly or thyroid nodule.  Lungs: Clear to auscultation bilaterally with normal respiratory effort. CV: Nondisplaced PMI.  Heart regular S1/S2, no S3/S4, no murmur.  No peripheral edema.  No carotid bruit.  Normal pedal pulses.  Abdomen: Soft, nontender, no hepatosplenomegaly, no distention.  Skin: Intact without lesions or rashes.  Neurologic: Alert and oriented x 3.  Psych: Normal affect. Extremities: No clubbing or cyanosis.  HEENT: Normal.   Assessment/Plan: 1. CAD: No ischemic symptoms.  S/p DES to pLAD, mLCx, dLCx in 9/15.   - With multiple DES, would continue Plavix long-term as long as he has no bleeding side effects.   - Continue ASA 81 and metoprolol. 2. Hyperlipidemia: He is not on a statin due  to myalgias with atorvastatin.  I am going to try him on pravastatin 40 mg daily with lipids/LFTs in 2 months.   [name] 02/28/2014

## 2014-03-29 ENCOUNTER — Telehealth: Payer: Self-pay | Admitting: Cardiology

## 2014-03-29 NOTE — Telephone Encounter (Signed)
Order signed by Dr Aundra Dubin for fasting lipid/liver profile returned to HIM to be faxed to Dr Tobie Poet.

## 2014-03-29 NOTE — Telephone Encounter (Signed)
Pt asking Dr Aundra Dubin to fax an order for fasting lipid/liver profile due February 18,2016  to his  PCP, Dr Dirk Dress in [place]. Dr Tobie Poet ph [phone]. Pt advised I will fax an order for lab to Dr Tobie Poet so he can have fasting lipid/liver profile at her office February 18,2016.

## 2014-03-29 NOTE — Telephone Encounter (Signed)
New Msg        Pt would like to go to PCP to have labs completed, is this ok?    Pt would like a call back.

## 2014-04-06 ENCOUNTER — Observation Stay (HOSPITAL_COMMUNITY)
Admission: EM | Admit: 2014-04-06 | Discharge: 2014-04-08 | Disposition: A | Discharge status: Home / Self Care | Payer: Commercial Managed Care - HMO | Attending: Cardiology | Admitting: Cardiology

## 2014-04-06 ENCOUNTER — Encounter (HOSPITAL_COMMUNITY): Payer: Self-pay | Admitting: *Deleted

## 2014-04-06 DIAGNOSIS — R61 Generalized hyperhidrosis: Secondary | ICD-10-CM | POA: Diagnosis not present

## 2014-04-06 DIAGNOSIS — E7800 Pure hypercholesterolemia, unspecified: Secondary | ICD-10-CM | POA: Diagnosis present

## 2014-04-06 DIAGNOSIS — I2510 Atherosclerotic heart disease of native coronary artery without angina pectoris: Secondary | ICD-10-CM | POA: Insufficient documentation

## 2014-04-06 DIAGNOSIS — M47892 Other spondylosis, cervical region: Secondary | ICD-10-CM | POA: Diagnosis not present

## 2014-04-06 DIAGNOSIS — C099 Malignant neoplasm of tonsil, unspecified: Secondary | ICD-10-CM | POA: Diagnosis present

## 2014-04-06 DIAGNOSIS — F4310 Post-traumatic stress disorder, unspecified: Secondary | ICD-10-CM | POA: Insufficient documentation

## 2014-04-06 DIAGNOSIS — Z87891 Personal history of nicotine dependence: Secondary | ICD-10-CM | POA: Diagnosis not present

## 2014-04-06 DIAGNOSIS — F329 Major depressive disorder, single episode, unspecified: Secondary | ICD-10-CM | POA: Diagnosis not present

## 2014-04-06 DIAGNOSIS — Z85828 Personal history of other malignant neoplasm of skin: Secondary | ICD-10-CM | POA: Diagnosis not present

## 2014-04-06 DIAGNOSIS — Z888 Allergy status to other drugs, medicaments and biological substances status: Secondary | ICD-10-CM | POA: Diagnosis not present

## 2014-04-06 DIAGNOSIS — F419 Anxiety disorder, unspecified: Secondary | ICD-10-CM | POA: Diagnosis not present

## 2014-04-06 DIAGNOSIS — E785 Hyperlipidemia, unspecified: Secondary | ICD-10-CM | POA: Insufficient documentation

## 2014-04-06 DIAGNOSIS — F13239 Sedative, hypnotic or anxiolytic dependence with withdrawal, unspecified: Secondary | ICD-10-CM | POA: Insufficient documentation

## 2014-04-06 DIAGNOSIS — R079 Chest pain, unspecified: Secondary | ICD-10-CM | POA: Diagnosis present

## 2014-04-06 DIAGNOSIS — G620 Drug-induced polyneuropathy: Secondary | ICD-10-CM | POA: Diagnosis not present

## 2014-04-06 DIAGNOSIS — I252 Old myocardial infarction: Secondary | ICD-10-CM | POA: Insufficient documentation

## 2014-04-06 DIAGNOSIS — E876 Hypokalemia: Secondary | ICD-10-CM | POA: Insufficient documentation

## 2014-04-06 DIAGNOSIS — Z955 Presence of coronary angioplasty implant and graft: Secondary | ICD-10-CM | POA: Diagnosis not present

## 2014-04-06 DIAGNOSIS — I10 Essential (primary) hypertension: Secondary | ICD-10-CM | POA: Diagnosis not present

## 2014-04-06 DIAGNOSIS — Z85818 Personal history of malignant neoplasm of other sites of lip, oral cavity, and pharynx: Secondary | ICD-10-CM | POA: Insufficient documentation

## 2014-04-06 DIAGNOSIS — R1314 Dysphagia, pharyngoesophageal phase: Secondary | ICD-10-CM | POA: Diagnosis present

## 2014-04-06 DIAGNOSIS — E039 Hypothyroidism, unspecified: Secondary | ICD-10-CM | POA: Diagnosis present

## 2014-04-06 DIAGNOSIS — K219 Gastro-esophageal reflux disease without esophagitis: Secondary | ICD-10-CM | POA: Insufficient documentation

## 2014-04-06 DIAGNOSIS — F13939 Sedative, hypnotic or anxiolytic use, unspecified with withdrawal, unspecified: Secondary | ICD-10-CM | POA: Diagnosis present

## 2014-04-06 DIAGNOSIS — R0789 Other chest pain: Principal | ICD-10-CM | POA: Insufficient documentation

## 2014-04-06 NOTE — ED Provider Notes (Signed)
CSN: [id number]     Arrival date & time 04/06/14  2319 History  This chart was scribed for Hoy Morn, MD by Molli Posey, ED Scribe. This patient was seen in room A07C/A07C and the patient's care was started 12:30 AM.   Chief Complaint  Patient presents with  . Chest Pain   The history is provided by the patient. No language interpreter was used.   HPI Comments: Bobby Floyd is a 54 y.o. male with a history of MI, coronary stents, throat CA, CAD and echocardiogram who presents to the Emergency Department complaining of CP with radiation to the left arm that started earlier tonight. Pt states he started having CP similar to his prior MIs so he took baby aspirin and spray nitroglycerin. He states he was diaphoretic during onset. On EMS arrival pt was hypertensive and was given 2 more nitro. Pt states that he currently feels better. He states that he walks 5 miles everyday and has been for the last 3 to 4 years.    Past Medical History  Diagnosis Date  . Hypothyroidism   . Degenerative joint disease of cervical spine   . GERD (gastroesophageal reflux disease)   . Dyslipidemia (high LDL; low HDL)   . Chemotherapy-induced neuropathy   . Myocardial infarction 2004; 2007; 2013  . Anemia   . History of blood transfusion 2009    "after throat cancer OR" (10/10/2012)  . 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 Depression   . Basal cell carcinoma of skin   . Melanoma     "right hand or forearm" (10/10/2012)  . Tonsillar cancer     Squamous cell, on the left, stage IV; radiation therapy 10/21/07-12/11/07  . Squamous cell carcinoma     "forearms, hands, head, nose" (10/10/2012)  . Complication of anesthesia     woke up violent a couple of times "  . History of echocardiogram     Echo (11/10/13):  Mod LVH, EF 55-60%, no RWMA, mild RAE.  Marland Kitchen Coronary artery disease     a. NSTEMI 4/13 >>> PCI:  BMS to OM2;  b.  Nuclear (8/14):  Apical thinning, no  ischemia, EF 50%; NORMAL;  c.  [place]:  LHC (9/15):  EF 60-65%, ostial LAD 20% followed by 70-80%, mid LAD 40%, apical LAD 60%, mid CFX 95-99%, AVCFX 80%, prox OM1 20%, OM2 PTCA site patent, RCA 60-70% >>> PCI:  Promus Premier DES x 2 to CFX and Xience Alpine DES to prox to mid LAD   Past Surgical History  Procedure Laterality Date  . Tonsillectomy Left 2009    Dr. Silvio Clayman [facility]  . Posterior fusion cervical spine  September 2012    C5-6  . Pharyngectomy Left 2009    Dr. Silvio Clayman  . Partial glossectomy Left     Dr. Silvio Clayman  . Neck dissection Left     Dr. Silvio Clayman  . Multiple tooth extractions  2009  . Skin cancer excision      Multiple squamous and basal cell carcinomas  . Melanoma excision Right     forearm  . Cervical discectomy  2010    C 5-6  . Esophagogastroduodenoscopy  03/26/2011    Procedure: ESOPHAGOGASTRODUODENOSCOPY (EGD);  Surgeon: Gatha Mayer, MD;  Location: [facility];  Service: Endoscopy;  Laterality: N/A;  . Balloon dilation  03/26/2011    Procedure: BALLOON DILATION;  Surgeon: Gatha Mayer, MD;  Location: [facility] ENDOSCOPY;  Service: Endoscopy;  Laterality: N/A;  . Colonoscopy  03/26/2011    Procedure: COLONOSCOPY;  Surgeon: Gatha Mayer, MD;  Location: [facility] ENDOSCOPY;  Service: Endoscopy;  Laterality: N/A;  . Coronary angioplasty  06/2011    LAD 40%, OM1 60%, small OM 2 thrombotic treated with PTCA to 20%, RCA occluded but recanalized, EF 50%  . Carotid endarterectomy Left     "I've got a stent" (10/10/2012)  . Coronary stent placement  11/10/2013    DES x 2 CFX, DES x 1 LAD  . Left heart catheterization with coronary angiogram N/A 06/25/2011    Procedure: LEFT HEART CATHETERIZATION WITH CORONARY ANGIOGRAM;  Surgeon: Hillary Bow, MD;  Location: [facility] CATH LAB;  Service: Cardiovascular;  Laterality: N/A;  . Percutaneous coronary intervention-balloon only  06/25/2011    Procedure: PERCUTANEOUS CORONARY INTERVENTION-BALLOON ONLY;  Surgeon: Hillary Bow, MD;  Location: [facility] CATH  LAB;  Service: Cardiovascular;;  . Left heart catheterization with coronary angiogram N/A 11/10/2013    Procedure: LEFT HEART CATHETERIZATION WITH CORONARY ANGIOGRAM;  Surgeon: Leonie Man, MD;  Location: [facility] CATH LAB;  Service: Cardiovascular;  Laterality: N/A;   Family History  Problem Relation Age of Onset  . Colon cancer Maternal Uncle   . Heart disease Maternal Grandmother   . Cirrhosis Maternal Grandfather     alcoholic  . Cirrhosis Maternal Uncle     alcoholic  . Hypothyroidism Mother   . Goiter Mother   . Heart attack Mother   . Heart attack Paternal Grandmother    History  Substance Use Topics  . Smoking status: Former Smoker -- 1.50 packs/day for 20 years    Types: Cigarettes    Quit date: 10/24/2013  . Smokeless tobacco: Never Used  . Alcohol Use: No    Review of Systems  A complete 10 system review of systems was obtained and all systems are negative except as noted in the HPI and PMH.   Allergies  Lipitor; Other; Propofol; Benadryl; Ondansetron; and Promethazine hcl  Home Medications   Prior to Admission medications   Medication Sig Start Date End Date Taking? Authorizing Provider  aspirin 81 MG tablet Take 81 mg by mouth daily.    Historical Provider, MD  clopidogrel (PLAVIX) 75 MG tablet Take 1 tablet (75 mg total) by mouth daily. 11/24/13   Liliane Shi, PA-C  diazepam (VALIUM) 10 MG tablet Take 20 mg by mouth at bedtime.    Historical Provider, MD  levothyroxine (SYNTHROID, LEVOTHROID) 100 MCG tablet Take 100 mcg by mouth daily.      Historical Provider, MD  metoprolol tartrate (LOPRESSOR) 25 MG tablet Take 0.5 tablets (12.5 mg total) by mouth 2 (two) times daily. 06/30/13   Larey Dresser, MD  nitroGLYCERIN (NITROSTAT) 0.4 MG SL tablet Place 1 tablet (0.4 mg total) under the tongue every 5 (five) minutes x 3 doses as needed for chest pain. 10/11/12   Liliane Shi, PA-C  pantoprazole (PROTONIX) 40 MG tablet Take 1 tablet (40 mg total) by mouth 2 (two)  times daily. 10/11/12   Liliane Shi, PA-C  pravastatin (PRAVACHOL) 40 MG tablet Take 1 tablet (40 mg total) by mouth every evening. 02/26/14   Larey Dresser, MD  traZODone (DESYREL) 150 MG tablet Take 150 mg by mouth at bedtime.  04/17/12   Historical Provider, MD   BP 133/96 mmHg  Pulse 78  Temp(Src) 98.1 F (36.7 C) (Oral)  Resp 18  Ht 6\' 2"  (1.88 m)  Wt  190 lb (86.183 kg)  BMI 24.38 kg/m2  SpO2 98% Physical Exam  Constitutional: He is oriented to person, place, and time. He appears well-developed and well-nourished.  HENT:  Head: Normocephalic and atraumatic.  Eyes: EOM are normal.  Neck: Normal range of motion.  Cardiovascular: Normal rate, regular rhythm, normal heart sounds and intact distal pulses.   Pulmonary/Chest: Effort normal and breath sounds normal. No respiratory distress.  Abdominal: Soft. He exhibits no distension. There is no tenderness.  Musculoskeletal: Normal range of motion.  Neurological: He is alert and oriented to person, place, and time.  Skin: Skin is warm and dry.  Psychiatric: He has a normal mood and affect. Judgment normal.  Nursing note and vitals reviewed.   ED Course  Procedures   DIAGNOSTIC STUDIES: Oxygen Saturation is 98% on RA, normal by my interpretation.    COORDINATION OF CARE: 12:35 AM Discussed treatment plan with pt at bedside and pt agreed to plan.   Labs Review Labs Reviewed  CBC - Abnormal; Notable for the following:    Platelets 142 (*)    All other components within normal limits  BASIC METABOLIC PANEL - Abnormal; Notable for the following:    Glucose, Bld 100 (*)    GFR calc non Af Amer 83 (*)    All other components within normal limits  Randolm Idol, ED    Imaging Review Dg Chest 2 View  04/07/2014   CLINICAL DATA:  High blood pressure. Swelling in the hands. Headache. Chest pain.  EXAM: CHEST  2 VIEW  COMPARISON:  11/09/2013  FINDINGS: Normal heart size and pulmonary vascularity. No focal airspace disease or  consolidation in the lungs. No blunting of costophrenic angles. No pneumothorax. Mediastinal contours appear intact. Postoperative changes in the cervical spine. Mild degenerative changes in the thoracic spine. Coronary artery stent.  IMPRESSION: No active cardiopulmonary disease.   Electronically Signed   By: Lucienne Capers M.D.   On: 04/07/2014 01:48  I personally reviewed the imaging tests through PACS system I reviewed available ER/hospitalization records through the EMR   EKG Interpretation #1  Date/Time:  Tuesday April 06 2014 23:30:20 EST Ventricular Rate:  80 PR Interval:  152 QRS Duration: 81 QT Interval:  382 QTC Calculation: 441 R Axis:   52 Text Interpretation:  Sinus rhythm Abnormal R-wave progression, early  transition Baseline wander in lead(s) III No significant change was found  Confirmed by Wille Aubuchon  MD, Vidyuth Belsito ([id number]) on 04/06/2014 11:58:28 PM      EKG Interpretation #2  Date/Time:  Wednesday April 07 2014 02:33:05 EST Ventricular Rate:  65 PR Interval:  151 QRS Duration: 92 QT Interval:  401 QTC Calculation: 417 R Axis:   65 Text Interpretation:  Sinus rhythm Abnormal R-wave progression, early transition Minimal ST elevation, inferior leads No significant change was found Confirmed by Chiyo Fay  MD, Jaimya Feliciano ([id number]) on 04/07/2014 2:58:55 AM        MDM   Final diagnoses:  None   Given the patient's recent MI requiring PCI in September 2015 and now with recurrent left-sided chest pain with radiation to his left shoulder ask cardiology to see and evaluate the patient.  The patient was pain-free on my initial evaluation but while in emergency department is developed recurrent chest pain.  His repeat x-ray looks normal.  His artery had aspirin and nitroglycerin.  He'll be given morphine at this time.  Cardiology to see and evaluate the patient.  Doubt PE.   I personally performed the services  described in this documentation, which was scribed in my presence. The  recorded information has been reviewed and is accurate.        Hoy Morn, MD 04/07/14 [phone]

## 2014-04-06 NOTE — ED Notes (Signed)
Pt. Started having 5/10 chst pain with radiation to left arm. Pt. Was diaphoretic took 2 nitro and a baby aspirin and called EMS. On EMS arrival pt. Was hypertensive and was given 2 more nitro. BP came down some with nitro

## 2014-04-07 ENCOUNTER — Emergency Department (HOSPITAL_COMMUNITY): Payer: Commercial Managed Care - HMO

## 2014-04-07 ENCOUNTER — Encounter (HOSPITAL_COMMUNITY): Payer: Self-pay | Admitting: General Practice

## 2014-04-07 DIAGNOSIS — F419 Anxiety disorder, unspecified: Secondary | ICD-10-CM

## 2014-04-07 DIAGNOSIS — E780 Pure hypercholesterolemia: Secondary | ICD-10-CM

## 2014-04-07 DIAGNOSIS — R1314 Dysphagia, pharyngoesophageal phase: Secondary | ICD-10-CM | POA: Diagnosis not present

## 2014-04-07 DIAGNOSIS — E039 Hypothyroidism, unspecified: Secondary | ICD-10-CM | POA: Diagnosis not present

## 2014-04-07 DIAGNOSIS — I209 Angina pectoris, unspecified: Secondary | ICD-10-CM

## 2014-04-07 DIAGNOSIS — R0789 Other chest pain: Secondary | ICD-10-CM | POA: Diagnosis not present

## 2014-04-07 DIAGNOSIS — F13939 Sedative, hypnotic or anxiolytic use, unspecified with withdrawal, unspecified: Secondary | ICD-10-CM | POA: Diagnosis present

## 2014-04-07 DIAGNOSIS — F13231 Sedative, hypnotic or anxiolytic dependence with withdrawal delirium: Secondary | ICD-10-CM

## 2014-04-07 DIAGNOSIS — F13239 Sedative, hypnotic or anxiolytic dependence with withdrawal, unspecified: Secondary | ICD-10-CM | POA: Diagnosis not present

## 2014-04-07 LAB — HEPATIC FUNCTION PANEL
ALK PHOS: 67 U/L (ref 39–117)
ALT: 13 U/L (ref 0–53)
AST: 24 U/L (ref 0–37)
Albumin: 4.3 g/dL (ref 3.5–5.2)
Bilirubin, Direct: 0.3 mg/dL (ref 0.0–0.5)
Indirect Bilirubin: 1 mg/dL — ABNORMAL HIGH (ref 0.3–0.9)
Total Bilirubin: 1.3 mg/dL — ABNORMAL HIGH (ref 0.3–1.2)
Total Protein: 6.7 g/dL (ref 6.0–8.3)

## 2014-04-07 LAB — BASIC METABOLIC PANEL
Anion gap: 7 (ref 5–15)
Anion gap: 7 (ref 5–15)
BUN: 13 mg/dL (ref 6–23)
BUN: 15 mg/dL (ref 6–23)
CALCIUM: 9.1 mg/dL (ref 8.4–10.5)
CHLORIDE: 103 mmol/L (ref 96–112)
CO2: 26 mmol/L (ref 19–32)
CO2: 27 mmol/L (ref 19–32)
CREATININE: 1.06 mg/dL (ref 0.50–1.35)
Calcium: 9.6 mg/dL (ref 8.4–10.5)
Chloride: 107 mmol/L (ref 96–112)
Creatinine, Ser: 1.02 mg/dL (ref 0.50–1.35)
GFR calc non Af Amer: 83 mL/min — ABNORMAL LOW (ref >90)
GFR, EST NON AFRICAN AMERICAN: 80 mL/min — AB (ref >90)
GLUCOSE: 100 mg/dL — AB (ref 70–99)
GLUCOSE: 104 mg/dL — AB (ref 70–99)
Potassium: 3.5 mmol/L (ref 3.5–5.1)
Potassium: 3.8 mmol/L (ref 3.5–5.1)
Sodium: 137 mmol/L (ref 135–145)
Sodium: 140 mmol/L (ref 135–145)

## 2014-04-07 LAB — CBC
HCT: 44.9 % (ref 39.0–52.0)
HEMATOCRIT: 47.9 % (ref 39.0–52.0)
Hemoglobin: 16 g/dL (ref 13.0–17.0)
Hemoglobin: 16.5 g/dL (ref 13.0–17.0)
MCH: 31.5 pg (ref 26.0–34.0)
MCH: 32.6 pg (ref 26.0–34.0)
MCHC: 34.4 g/dL (ref 30.0–36.0)
MCHC: 35.6 g/dL (ref 30.0–36.0)
MCV: 91.4 fL (ref 78.0–100.0)
MCV: 91.6 fL (ref 78.0–100.0)
PLATELETS: 124 10*3/uL — AB (ref 150–400)
Platelets: 142 10*3/uL — ABNORMAL LOW (ref 150–400)
RBC: 4.91 MIL/uL (ref 4.22–5.81)
RBC: 5.23 MIL/uL (ref 4.22–5.81)
RDW: 12.7 % (ref 11.5–15.5)
RDW: 12.8 % (ref 11.5–15.5)
WBC: 5.2 10*3/uL (ref 4.0–10.5)
WBC: 7.1 10*3/uL (ref 4.0–10.5)

## 2014-04-07 LAB — RAPID URINE DRUG SCREEN, HOSP PERFORMED
Amphetamines: NOT DETECTED
Barbiturates: NOT DETECTED
Benzodiazepines: POSITIVE — AB
Cocaine: NOT DETECTED
OPIATES: POSITIVE — AB
TETRAHYDROCANNABINOL: POSITIVE — AB

## 2014-04-07 LAB — TROPONIN I: Troponin I: <0.03 ng/mL (ref <0.031)

## 2014-04-07 LAB — I-STAT TROPONIN, ED: TROPONIN I, POC: 0 ng/mL (ref 0.00–0.08)

## 2014-04-07 LAB — T4, FREE: FREE T4: 1.33 ng/dL (ref 0.80–1.80)

## 2014-04-07 LAB — LIPID PANEL
CHOL/HDL RATIO: 2.8 ratio
Cholesterol: 174 mg/dL (ref 0–200)
HDL: 62 mg/dL (ref >39)
LDL Cholesterol: 100 mg/dL — ABNORMAL HIGH (ref 0–99)
TRIGLYCERIDES: 62 mg/dL (ref <150)
VLDL: 12 mg/dL (ref 0–40)

## 2014-04-07 LAB — PROTIME-INR
INR: 1.02 (ref 0.00–1.49)
PROTHROMBIN TIME: 13.5 s (ref 11.6–15.2)

## 2014-04-07 LAB — TSH: TSH: 7.355 u[IU]/mL — AB (ref 0.350–4.500)

## 2014-04-07 LAB — MAGNESIUM: Magnesium: 2.1 mg/dL (ref 1.5–2.5)

## 2014-04-07 MED ORDER — DIAZEPAM 5 MG PO TABS
10.0000 mg | ORAL_TABLET | Freq: Three times a day (TID) | ORAL | Status: DC
  Administered 2014-04-07: 10 mg via ORAL
  Filled 2014-04-07: qty 2

## 2014-04-07 MED ORDER — LEVOTHYROXINE SODIUM 125 MCG PO TABS
125.0000 ug | ORAL_TABLET | Freq: Every day | ORAL | Status: DC
  Administered 2014-04-08: 125 ug via ORAL
  Filled 2014-04-07 (×3): qty 1

## 2014-04-07 MED ORDER — LORAZEPAM 2 MG/ML IJ SOLN: 1.0000 mg | INTRAMUSCULAR | Status: DC | PRN

## 2014-04-07 MED ORDER — PANTOPRAZOLE SODIUM 40 MG PO TBEC
40.0000 mg | DELAYED_RELEASE_TABLET | ORAL | Status: DC
  Administered 2014-04-07 – 2014-04-08 (×2): 40 mg via ORAL
  Filled 2014-04-07 (×3): qty 1

## 2014-04-07 MED ORDER — NITROGLYCERIN 0.4 MG SL SUBL: 0.4000 mg | SUBLINGUAL_TABLET | SUBLINGUAL | Status: DC | PRN

## 2014-04-07 MED ORDER — LEVOTHYROXINE SODIUM 100 MCG PO TABS
100.0000 ug | ORAL_TABLET | Freq: Every day | ORAL | Status: DC
  Administered 2014-04-07: 100 ug via ORAL
  Filled 2014-04-07 (×2): qty 1

## 2014-04-07 MED ORDER — ASPIRIN 81 MG PO CHEW
324.0000 mg | CHEWABLE_TABLET | ORAL | Status: AC
  Administered 2014-04-07: 324 mg via ORAL
  Filled 2014-04-07: qty 4

## 2014-04-07 MED ORDER — ENOXAPARIN SODIUM 40 MG/0.4ML ~~LOC~~ SOLN
40.0000 mg | SUBCUTANEOUS | Status: DC
  Administered 2014-04-07 – 2014-04-08 (×2): 40 mg via SUBCUTANEOUS
  Filled 2014-04-07 (×2): qty 0.4

## 2014-04-07 MED ORDER — ONDANSETRON HCL 4 MG/2ML IJ SOLN: 4.0000 mg | Freq: Four times a day (QID) | INTRAMUSCULAR | Status: DC | PRN

## 2014-04-07 MED ORDER — ASPIRIN 81 MG PO CHEW: 81.0000 mg | CHEWABLE_TABLET | ORAL | Status: DC

## 2014-04-07 MED ORDER — LORAZEPAM 2 MG/ML IJ SOLN
INTRAMUSCULAR | Status: AC
  Administered 2014-04-07: 2 mg
  Filled 2014-04-07: qty 1

## 2014-04-07 MED ORDER — ASPIRIN EC 81 MG PO TBEC: 81.0000 mg | DELAYED_RELEASE_TABLET | Freq: Every day | ORAL | Status: DC

## 2014-04-07 MED ORDER — TRAZODONE HCL 150 MG PO TABS
150.0000 mg | ORAL_TABLET | Freq: Every day | ORAL | Status: DC
  Administered 2014-04-07: 150 mg via ORAL
  Filled 2014-04-07 (×2): qty 1

## 2014-04-07 MED ORDER — ASPIRIN 300 MG RE SUPP
300.0000 mg | RECTAL | Status: AC
  Filled 2014-04-07: qty 1

## 2014-04-07 MED ORDER — ASPIRIN EC 81 MG PO TBEC
81.0000 mg | DELAYED_RELEASE_TABLET | Freq: Every day | ORAL | Status: DC
  Administered 2014-04-08: 81 mg via ORAL
  Filled 2014-04-07: qty 1

## 2014-04-07 MED ORDER — HYDROCODONE-ACETAMINOPHEN 10-325 MG PO TABS: 1.0000 | ORAL_TABLET | Freq: Four times a day (QID) | ORAL | Status: DC | PRN

## 2014-04-07 MED ORDER — CLOPIDOGREL BISULFATE 75 MG PO TABS
75.0000 mg | ORAL_TABLET | Freq: Every day | ORAL | Status: DC
  Administered 2014-04-07 – 2014-04-08 (×2): 75 mg via ORAL
  Filled 2014-04-07 (×2): qty 1

## 2014-04-07 MED ORDER — PRAVASTATIN SODIUM 40 MG PO TABS
40.0000 mg | ORAL_TABLET | Freq: Every evening | ORAL | Status: DC
  Filled 2014-04-07: qty 1

## 2014-04-07 MED ORDER — MORPHINE SULFATE 4 MG/ML IJ SOLN
4.0000 mg | Freq: Once | INTRAMUSCULAR | Status: AC
  Administered 2014-04-07: 4 mg via INTRAVENOUS
  Filled 2014-04-07: qty 1

## 2014-04-07 MED ORDER — HYDRALAZINE HCL 20 MG/ML IJ SOLN
10.0000 mg | Freq: Three times a day (TID) | INTRAMUSCULAR | Status: DC | PRN
  Administered 2014-04-07: 10 mg via INTRAVENOUS
  Filled 2014-04-07: qty 1

## 2014-04-07 MED ORDER — ACETAMINOPHEN 325 MG PO TABS: 650.0000 mg | ORAL_TABLET | ORAL | Status: DC | PRN

## 2014-04-07 MED ORDER — HYDRALAZINE HCL 25 MG PO TABS
12.5000 mg | ORAL_TABLET | Freq: Two times a day (BID) | ORAL | Status: DC
Start: 2014-04-07 — End: 2014-04-07

## 2014-04-07 MED ORDER — AMLODIPINE BESYLATE 5 MG PO TABS: 5.0000 mg | ORAL_TABLET | Freq: Every day | ORAL | Status: DC

## 2014-04-07 MED ORDER — METOPROLOL TARTRATE 50 MG PO TABS
50.0000 mg | ORAL_TABLET | Freq: Two times a day (BID) | ORAL | Status: DC
  Administered 2014-04-07 (×2): 50 mg via ORAL
  Filled 2014-04-07 (×4): qty 1

## 2014-04-07 MED ORDER — HYDROMORPHONE HCL 1 MG/ML IJ SOLN: 0.5000 mg | INTRAMUSCULAR | Status: DC | PRN

## 2014-04-07 MED ORDER — ALPRAZOLAM 0.5 MG PO TABS
1.0000 mg | ORAL_TABLET | Freq: Three times a day (TID) | ORAL | Status: DC
  Administered 2014-04-07 – 2014-04-08 (×3): 1 mg via ORAL
  Filled 2014-04-07 (×2): qty 4
  Filled 2014-04-07: qty 2

## 2014-04-07 MED ORDER — HYDROMORPHONE HCL 1 MG/ML IJ SOLN
1.0000 mg | Freq: Once | INTRAMUSCULAR | Status: AC
  Administered 2014-04-07: 1 mg via INTRAVENOUS
  Filled 2014-04-07: qty 1

## 2014-04-07 MED ORDER — POTASSIUM CHLORIDE CRYS ER 20 MEQ PO TBCR
20.0000 meq | EXTENDED_RELEASE_TABLET | Freq: Once | ORAL | Status: AC
  Administered 2014-04-07: 20 meq via ORAL
  Filled 2014-04-07: qty 1

## 2014-04-07 MED ORDER — HYDRALAZINE HCL 20 MG/ML IJ SOLN: 10.0000 mg | Freq: Once | INTRAMUSCULAR | Status: DC

## 2014-04-07 MED ORDER — LORAZEPAM 2 MG/ML IJ SOLN
2.0000 mg | Freq: Once | INTRAMUSCULAR | Status: AC
  Administered 2014-04-07: 2 mg via INTRAVENOUS

## 2014-04-07 MED ORDER — METOPROLOL TARTRATE 12.5 MG HALF TABLET
12.5000 mg | ORAL_TABLET | Freq: Two times a day (BID) | ORAL | Status: DC
  Administered 2014-04-07: 12.5 mg via ORAL
  Filled 2014-04-07 (×2): qty 1

## 2014-04-07 MED ORDER — METOPROLOL TARTRATE 25 MG PO TABS
25.0000 mg | ORAL_TABLET | Freq: Two times a day (BID) | ORAL | Status: DC
  Filled 2014-04-07: qty 1

## 2014-04-07 NOTE — Progress Notes (Signed)
Patient: Bobby Floyd / Admit Date: 04/06/2014 / Date of Encounter: 04/07/2014, 10:07 AM   Subjective: This morning c/o nausea, vomiting (yellow, 165mL), shakiness, sweating, leg cramps and chills. Blood pressure taken automatically was 187/170. Manual blood pressure 165/110. BP at home typically runs 580 systolic. His other arm is s/p surgery for prior cancer so difficult to assess BP reliably.  He becomes extremely anxious when talking about how he is feeling and became tremulous, tearful, and developed a stutter. Chest pain is now gone but he still feels "awful" with nausea, headache, leg cramps and chills. Has not taken his valium for 2 days. Says he's gone downhill since starting statin.  EKG NSR LVH no ST-T changes. No acute changes on CXR including normal mediastinal contours.  Objective: Telemetry: NSR, became sinus tach when anxious Physical Exam: Blood pressure 165/110, pulse 97, temperature 97.5 F (36.4 C), temperature source Oral, resp. rate 16, height 6\' 2"  (1.88 m), weight 181 lb 6.4 oz (82.283 kg), SpO2 98 %. General: Well developed anxious appearing WM in no acute distress. Head: Normocephalic, atraumatic, sclera non-icteric, no xanthomas, nares are without discharge. Neck: Negative for carotid bruits. JVP not elevated. Lungs: Clear bilaterally to auscultation without wheezes, rales, or rhonchi. Breathing is unlabored. Heart: RRR S1 S2 without murmurs, rubs, or gallops.  Abdomen: Soft, non-tender, non-distended with normoactive bowel sounds. No rebound/guarding. Extremities: No clubbing or cyanosis. No lowe extremity edema. Distal pedal pulses are 2+ and equal bilaterally. Neuro: Alert and oriented X 3, follows commands, anxious, tremulous, becomes tearful easily   Intake/Output Summary (Last 24 hours) at 04/07/14 1007 Last data filed at 04/07/14 0817  Gross per 24 hour  Intake      0 ml  Output      0 ml  Net      0 ml    Inpatient Medications:  . aspirin  81  mg Oral BH-q7a  . [START ON 04/08/2014] aspirin EC  81 mg Oral Daily  . clopidogrel  75 mg Oral Daily  . diazepam  10 mg Oral TID  . enoxaparin (LOVENOX) injection  40 mg Subcutaneous Q24H  . levothyroxine  100 mcg Oral QAC breakfast  . metoprolol tartrate  12.5 mg Oral BID  . pantoprazole  40 mg Oral BH-q7a  . pravastatin  40 mg Oral QPM  . traZODone  150 mg Oral QHS   Infusions:    Labs:  Recent Labs  04/06/14 2353 04/07/14 0535  NA 140 137  K 3.8 3.5  CL 107 103  CO2 26 27  GLUCOSE 100* 104*  BUN 15 13  CREATININE 1.02 1.06  CALCIUM 9.6 9.1     Recent Labs  04/06/14 2353 04/07/14 0535  WBC 5.2 7.1  HGB 16.0 16.5  HCT 44.9 47.9  MCV 91.4 91.6  PLT 142* 124*    Recent Labs  04/07/14 0535  TROPONINI <0.03     Radiology/Studies:  Dg Chest 2 View  04/07/2014   CLINICAL DATA:  High blood pressure. Swelling in the hands. Headache. Chest pain.  EXAM: CHEST  2 VIEW  COMPARISON:  11/09/2013  FINDINGS: Normal heart size and pulmonary vascularity. No focal airspace disease or consolidation in the lungs. No blunting of costophrenic angles. No pneumothorax. Mediastinal contours appear intact. Postoperative changes in the cervical spine. Mild degenerative changes in the thoracic spine. Coronary artery stent.  IMPRESSION: No active cardiopulmonary disease.   Electronically Signed   By: Lucienne Capers M.D.   On: 04/07/2014 01:48  Assessment and Plan   1. Chest discomfort, improved, ruled out for MI and troponins negative 2. Chills/diaphoresis/leg cramping/anxiety, ?withdrawal symptoms 3. Hypertension (usually normtensive at home) 4. Known CAD (1st MI age 16, NSTEMI 2013 s/p PTCA OM2, 11/2013: s/p DES-pLAD/mLCx/dLCx) 5. Stage IV tonsillar cancer  His symptoms are atypical for ACS - he has ruled out and EKG was normal during acute symptoms. He appears extremely anxious and becomes diaphoretic and tremulous when discussing his symptoms. I called Dr. Gwenlyn Found to assess the  patient at bedside with me given unusual presentation. His chest pain has resolved, so will hold off on further imaging at present time, but will consult IM because we are concerned for component of withdrawal or other medical process which is not necessarily cardiac. His home valium has already been re-ordered - have asked nurse to give this now. Zofran has already been ordered for n/v. Will rx IV hydralazine PRN SBP>150 q8hr for now and increase metoprolol to 25mg  BID. Supect BP will come down with benzo resumption as well. Check UDS, blood cultures. Continue to cycle enzymes. D/c statin as patient is convinced this corresponded with his recent decline.   Signed, Melina Copa PA-C   Agree with findings by Melina Copa PA-C  Sx most C/W benzo W/D. Doubt ACS. Will get [facility] involved in care as well. Pt is tachy and diaphoretic.  Lorretta Harp, M.D., [place], [facility], [place] [address] [place], [place]  [address]  [phone] 04/08/2014 8:26 AM

## 2014-04-07 NOTE — Progress Notes (Signed)
I called [facility] and [facility] to clarify pt's home Valium vs. Xanax.  - Valium has not been filled for the past year. - Per [facility] record the prescription of Xanax has been changed to 0.5mg  TID on 04/05/2014, and pt got a refill that day. The prescription was previously 1mg  TID.   [facility] has been updated.  Thanks.  Maryanna Shape, PharmD, BCPS  Clinical Pharmacist  Pager: [phone]

## 2014-04-07 NOTE — Consult Note (Signed)
Medical Consultation   Bobby Floyd  OAC:[id number]  DOB: Jan 01, 1961  DOA: 04/06/2014  PCP: Rochel Brome, MD  Requesting physician: Dr Aundra Dubin  Reason for consultation: Patient appears to be in withdrawals, tremulous, shaky  History of Present Illness: Patient is a 54 year old male with extensive CAD, first MI at age of 73, NSTEMI 4/13, unstable angina, 9/15, cardiac cath showed 70-80% proximal LAD, 60% apical LAD, 95% circumflex, 80% distal circumflex, diffuse moderate disease of nondominant RCA, stent to proximal LAD, mid circumflex and distal circumflex. Patient has hypertension, hyperlipidemia, hypothyroidism, PTSD/anxiety/depression (from witnessing his son's death and decapitation in a car wreck in 2009), takes Xanax daily. Patient currently admitted under cardiology service for chest pain. He was also reportedly hypotensive '300/169' upon EMS arrival. Patient also has history of stage IV SCC tonsils, has extensive chemotherapy and surgery, some residual dysphagia. Consult was called as patient was noticed to be very anxious, shaky, tremulous and sweaty, vomited this morning blood pressure elevated at 187/170. At the time of my encounter, patient noticed to be tremulous and diaphoretic, complaining of nausea, leg cramps. Listed on his outpatient medication is Valium 10mg  TID, which patient was not started on yet however when I questioned the patient he reports that he does not take Valium. He reports that he takes Xanax 1mg  AM, 2mg  qhs (prescribed as 1mg  TID PRN) and I verified the medication from his pill bottle in his bag. Patient also reported to the rapid response nurse that he does not take Valium.  Allergies:   Allergies  Allergen Reactions  . Lipitor [Atorvastatin] Swelling and Other (See Comments)    Urination problems, myalgias also  . Other Other (See Comments)    Possible resistance to all narcotic pain meds-dilaudid might work  . Propofol Other (See Comments)     "violent"  . Benadryl [Diphenhydramine Hcl] Swelling    Hyperactivity, very   . Ondansetron Other (See Comments)    Headaches  . Promethazine Hcl Nausea And Vomiting      Past Medical History  Diagnosis Date  . Hypothyroidism   . Degenerative joint disease of cervical spine   . GERD (gastroesophageal reflux disease)   . Dyslipidemia (high LDL; low HDL)   . Chemotherapy-induced neuropathy   . Myocardial infarction 2004; 2007; 2013  . Anemia   . History of blood transfusion 2009    "after throat cancer OR" (10/10/2012)  . 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 Depression   . Basal cell carcinoma of skin   . Melanoma     "right hand or forearm" (10/10/2012)  . Tonsillar cancer     Squamous cell, on the left, stage IV; radiation therapy 10/21/07-12/11/07  . Squamous cell carcinoma     "forearms, hands, head, nose" (10/10/2012)  . Complication of anesthesia     woke up violent a couple of times "  . History of echocardiogram     Echo (11/10/13):  Mod LVH, EF 55-60%, no RWMA, mild RAE.  Marland Kitchen Coronary artery disease     a. NSTEMI 4/13 >>> PCI:  BMS to OM2;  b.  Nuclear (8/14):  Apical thinning, no ischemia, EF 50%; NORMAL;  c.  [place]:  LHC (9/15):  EF 60-65%, ostial LAD 20% followed by 70-80%, mid LAD 40%, apical LAD 60%, mid CFX 95-99%, AVCFX 80%, prox OM1 20%, OM2 PTCA site patent, RCA 60-70% >>> PCI:  Promus Premier DES x 2 to CFX and Xience  Alpine DES to prox to mid LAD    Past Surgical History  Procedure Laterality Date  . Tonsillectomy Left 2009    Dr. Silvio Clayman [facility]  . Posterior fusion cervical spine  September 2012    C5-6  . Pharyngectomy Left 2009    Dr. Silvio Clayman  . Partial glossectomy Left     Dr. Silvio Clayman  . Neck dissection Left     Dr. Silvio Clayman  . Multiple tooth extractions  2009  . Skin cancer excision      Multiple squamous and basal cell carcinomas  . Melanoma excision Right     forearm  . Cervical discectomy  2010    C 5-6  .  Esophagogastroduodenoscopy  03/26/2011    Procedure: ESOPHAGOGASTRODUODENOSCOPY (EGD);  Surgeon: Gatha Mayer, MD;  Location: [facility];  Service: Endoscopy;  Laterality: N/A;  . Balloon dilation  03/26/2011    Procedure: BALLOON DILATION;  Surgeon: Gatha Mayer, MD;  Location: [facility] ENDOSCOPY;  Service: Endoscopy;  Laterality: N/A;  . Colonoscopy  03/26/2011    Procedure: COLONOSCOPY;  Surgeon: Gatha Mayer, MD;  Location: [facility] ENDOS[facility];  Service: Endoscopy;  Laterality: N/A;  . Coronary angioplasty  06/2011    LAD 40%, OM1 60%, small OM 2 thrombotic treated with PTCA to 20%, RCA occluded but recanalized, EF 50%  . Carotid endarterectomy Left     "I've got a stent" (10/10/2012)  . Coronary stent placement  11/10/2013    DES x 2 CFX, DES x 1 LAD  . Left heart catheterization with coronary angiogram N/A 06/25/2011    Procedure: LEFT HEART CATHETERIZATION WITH CORONARY ANGIOGRAM;  Surgeon: Hillary Bow, MD;  Location: [facility] CATH LAB;  Service: Cardiovascular;  Laterality: N/A;  . Percutaneous coronary intervention-balloon only  06/25/2011    Procedure: PERCUTANEOUS CORONARY INTERVENTION-BALLOON ONLY;  Surgeon: Hillary Bow, MD;  Location: [facility] CATH LAB;  Service: Cardiovascular;;  . Left heart catheterization with coronary angiogram N/A 11/10/2013    Procedure: LEFT HEART CATHETERIZATION WITH CORONARY ANGIOGRAM;  Surgeon: Leonie Man, MD;  Location: [facility] CATH LAB;  Service: Cardiovascular;  Laterality: N/A;    Social History:  reports that he quit smoking about 5 months ago. His smoking use included Cigarettes. He has a 30 pack-year smoking history. He has never used smokeless tobacco. He reports that he uses illicit drugs (Marijuana). He reports that he does not drink alcohol.  Family History  Problem Relation Age of Onset  . Colon cancer Maternal Uncle   . Heart disease Maternal Grandmother   . Cirrhosis Maternal Grandfather     alcoholic  . Cirrhosis Maternal Uncle     alcoholic  .  Hypothyroidism Mother   . Goiter Mother   . Heart attack Mother   . Heart attack Paternal Grandmother     Review of Systems:   Constitutional: Denies fever, chills, + diaphoresis, appetite change and fatigue.  HEENT: Denies photophobia, eye pain, redness, hearing loss, ear pain, congestion, sore throat, rhinorrhea, sneezing, mouth sores, trouble swallowing, neck pain, neck stiffness and tinnitus.   Respiratory: Denies SOB, DOE, cough and wheezing.  + chest tightness with sweating Cardiovascular: Denies chest pain, palpitations and leg swelling.  Gastrointestinal: Denies nausea, vomiting, abdominal pain, diarrhea, constipation, blood in stool and abdominal distention.  Genitourinary: Denies dysuria, urgency, frequency, hematuria, flank pain and difficulty urinating.  Musculoskeletal: Denies myalgias, back pain, joint swelling, arthralgias and gait problem.  Skin: Denies pallor, rash and wound.  Neurological: Denies dizziness, seizures, syncope, weakness, light-headedness,  numbness and headaches.   shaky and tremulous+ Hematological: Denies adenopathy. Easy bruising, personal or family bleeding history  Psychiatric/Behavioral: Denies suicidal ideation, mood changes, confusion, nervousness, sleep disturbance and agitation   Physical Exam: Blood pressure 165/110, pulse 97, temperature 97.5 F (36.4 C), temperature source Oral, resp. rate 16, height 6\' 2"  (1.88 m), weight 82.283 kg (181 lb 6.4 oz), SpO2 98 %.  General: Alert and awake, oriented x3,very anxious and tremulous, somewhat stuttering however follows commands  HEENT: normocephalic, atraumatic, anicteric sclera, pupils reactive to light and accommodation, EOMI, oropharynx clear CVS: S1-S2 clear, no murmur rubs or gallops Chest: clear to auscultation bilaterally, no wheezing, rales or rhonchi Abdomen: soft nontender, nondistended, normal bowel sounds, no organomegaly Extremities: no cyanosis, clubbing or edema noted bilaterally,  Shaky and tremulous  Neuro: Cranial nerves II-XII intact, no focal neurological deficits Psych: alert and oriented, stable mood and affect Skin: no rashes or lesions  Labs on Admission:  Basic Metabolic Panel:  Recent Labs Lab 04/06/14 2353 04/07/14 0535  NA 140 137  K 3.8 3.5  CL 107 103  CO2 26 27  GLUCOSE 100* 104*  BUN 15 13  CREATININE 1.02 1.06  CALCIUM 9.6 9.1   Liver Function Tests: No results for input(s): AST, ALT, ALKPHOS, BILITOT, PROT, ALBUMIN in the last 168 hours. No results for input(s): LIPASE, AMYLASE in the last 168 hours. No results for input(s): AMMONIA in the last 168 hours. CBC:  Recent Labs Lab 04/06/14 2353 04/07/14 0535  WBC 5.2 7.1  HGB 16.0 16.5  HCT 44.9 47.9  MCV 91.4 91.6  PLT 142* 124*   Cardiac Enzymes:  Recent Labs Lab 04/07/14 0535  TROPONINI <0.03   BNP: Invalid input(s): POCBNP CBG: No results for input(s): GLUCAP in the last 168 hours.  Inpatient Medications:   Scheduled Meds: . ALPRAZolam  1 mg Oral TID  . [START ON 04/08/2014] aspirin EC  81 mg Oral Daily  . clopidogrel  75 mg Oral Daily  . enoxaparin (LOVENOX) injection  40 mg Subcutaneous Q24H  . levothyroxine  100 mcg Oral QAC breakfast  . metoprolol tartrate  25 mg Oral BID  . pantoprazole  40 mg Oral [id number]  . potassium chloride  20 mEq Oral Once  . traZODone  150 mg Oral QHS   Continuous Infusions:    Radiological Exams on Admission: Dg Chest 2 View  04/07/2014   CLINICAL DATA:  High blood pressure. Swelling in the hands. Headache. Chest pain.  EXAM: CHEST  2 VIEW  COMPARISON:  11/09/2013  FINDINGS: Normal heart size and pulmonary vascularity. No focal airspace disease or consolidation in the lungs. No blunting of costophrenic angles. No pneumothorax. Mediastinal contours appear intact. Postoperative changes in the cervical spine. Mild degenerative changes in the thoracic spine. Coronary artery stent.  IMPRESSION: No active cardiopulmonary disease.    Electronically Signed   By: Lucienne Capers M.D.   On: 04/07/2014 01:48    Impression/Recommendations Active Problems: Chest pain with history of CAD, S/P coronary artery stent placement - DES x 2 to dominant CFX and DES to LAD 11/10/2013 - Per cardiology service  Acute benzodiazepine withdrawals with history of depression/anxiety, PTSD - Patient had not taken his Xanax since yesterday morning, was in ED last night. He reports that he does not take Valium which needs to be verified by pharmacy. Xanax is listed on his pill bottles. Patient is supposed to be taking Xanax 1 mg 3 times a day as needed however he takes it  as 1 mg AM and 2mg  QHS. - Patient's symptoms significantly improved after giving him 2 mg of IV Ativan. I have placed him on Xanax 1mg  TID for now and IV Ativan as needed for any anxiety/seizures. -  Monitor closely for any seizures due to benzodiazepine withdrawals -  Patient has extensive depression, anxiety, PTSD, may benefit from psychiatric evaluation and possibly being on Klonopin or other antidepressants. I have requested psychiatry consult for the patient.  - I have discontinued Valium.     History of Tonsillar cancer, stage IV squamous cell left tonsil with some dysphagia  - Follow outpatient with oncology.   Mild hypokalemia, leg cramps - Gave potassium 20 meq 1, check magnesium    Hypothyroidism - TSH 7.35, will increase levothyroxine to 132mcg daily, TSH needs to be followed in 4 weeks outpatient       Hypercholesterolemia -  LDL 100, allergic to Lipitor, on Pravachol at home    Accelerated hypertension : BP uncontrolled - Currently worse due to acute benzodiazepine withdrawals, continue beta blocker, improving with hydralazine  - I have increased his metoprolol to 50 mg BID, may need another agent, will defer to cardiology/primary service  - Continue adenosine as needed    [facility] medicine service will follow the patient, thank you for this  consultation.  Time Spent : 25 minutes  RAI,RIPUDEEP M.D. [facility] 04/07/2014, 11:30 AM

## 2014-04-07 NOTE — ED Notes (Signed)
EDP notified of pt's bp- 169/102. Per EDP pt is good for admission with current BP with no interventions necessary.

## 2014-04-07 NOTE — H&P (Signed)
Patient ID: Bobby Floyd MRN: [id number], DOB/AGE: 11/01/1960   Admit date: 04/06/2014   Primary Physician: Rochel Brome, MD Primary Cardiologist: [name], MD  Pt. Profile:  54 yo with history of extensive CAD presents with CP.   Problem List  Past Medical History  Diagnosis Date  . Hypothyroidism   . Degenerative joint disease of cervical spine   . GERD (gastroesophageal reflux disease)   . Dyslipidemia (high LDL; low HDL)   . Chemotherapy-induced neuropathy   . Myocardial infarction 2004; 2007; 2013  . Anemia   . History of blood transfusion 2009    "after throat cancer OR" (10/10/2012)  . 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 Depression   . Basal cell carcinoma of skin   . Melanoma     "right hand or forearm" (10/10/2012)  . Tonsillar cancer     Squamous cell, on the left, stage IV; radiation therapy 10/21/07-12/11/07  . Squamous cell carcinoma     "forearms, hands, head, nose" (10/10/2012)  . Complication of anesthesia     woke up violent a couple of times "  . History of echocardiogram     Echo (11/10/13):  Mod LVH, EF 55-60%, no RWMA, mild RAE.  Marland Kitchen Coronary artery disease     a. NSTEMI 4/13 >>> PCI:  BMS to OM2;  b.  Nuclear (8/14):  Apical thinning, no ischemia, EF 50%; NORMAL;  c.  [place]:  LHC (9/15):  EF 60-65%, ostial LAD 20% followed by 70-80%, mid LAD 40%, apical LAD 60%, mid CFX 95-99%, AVCFX 80%, prox OM1 20%, OM2 PTCA site patent, RCA 60-70% >>> PCI:  Promus Premier DES x 2 to CFX and Xience Alpine DES to prox to mid LAD    Past Surgical History  Procedure Laterality Date  . Tonsillectomy Left 2009    Dr. Silvio Clayman [facility]  . Posterior fusion cervical spine  September 2012    C5-6  . Pharyngectomy Left 2009    Dr. Silvio Clayman  . Partial glossectomy Left     Dr. Silvio Clayman  . Neck dissection Left     Dr. Silvio Clayman  . Multiple tooth extractions  2009  . Skin cancer excision      Multiple squamous and basal cell  carcinomas  . Melanoma excision Right     forearm  . Cervical discectomy  2010    C 5-6  . Esophagogastroduodenoscopy  03/26/2011    Procedure: ESOPHAGOGASTRODUODENOSCOPY (EGD);  Surgeon: Gatha Mayer, MD;  Location: [facility];  Service: Endoscopy;  Laterality: N/A;  . Balloon dilation  03/26/2011    Procedure: BALLOON DILATION;  Surgeon: Gatha Mayer, MD;  Location: [facility];  Service: Endoscopy;  Laterality: N/A;  . Colonoscopy  03/26/2011    Procedure: COLONOSCOPY;  Surgeon: Gatha Mayer, MD;  Location: [facility];  Service: Endoscopy;  Laterality: N/A;  . Coronary angioplasty  06/2011    LAD 40%, OM1 60%, small OM 2 thrombotic treated with PTCA to 20%, RCA occluded but recanalized, EF 50%  . Carotid endarterectomy Left     "I've got a stent" (10/10/2012)  . Coronary stent placement  11/10/2013    DES x 2 CFX, DES x 1 LAD  . Left heart catheterization with coronary angiogram N/A 06/25/2011    Procedure: LEFT HEART CATHETERIZATION WITH CORONARY ANGIOGRAM;  Surgeon: Hillary Bow, MD;  Location: [facility] CATH LAB;  Service: Cardiovascular;  Laterality: N/A;  . Percutaneous  coronary intervention-balloon only  06/25/2011    Procedure: PERCUTANEOUS CORONARY INTERVENTION-BALLOON ONLY;  Surgeon: Hillary Bow, MD;  Location: [facility] CATH LAB;  Service: Cardiovascular;;  . Left heart catheterization with coronary angiogram N/A 11/10/2013    Procedure: LEFT HEART CATHETERIZATION WITH CORONARY ANGIOGRAM;  Surgeon: Leonie Man, MD;  Location: [facility] CATH LAB;  Service: Cardiovascular;  Laterality: N/A;     Allergies  Allergies  Allergen Reactions  . Lipitor [Atorvastatin] Swelling and Other (See Comments)    Urination problems, myalgias also  . Other Other (See Comments)    Possible resistance to all narcotic pain meds-dilaudid might work  . Propofol Other (See Comments)    "violent"  . Benadryl [Diphenhydramine Hcl] Swelling    Hyperactivity, very   . Ondansetron Other (See Comments)     Headaches  . Promethazine Hcl Nausea And Vomiting    HPI  54 yo with history of extensive CAD presents with CP.  1st MI at 62. NSTEMI 4/13 with PTCA OM2 (nondominant right was subtotally occluded). Unstable angina 9/15 with LHC showing 70-80% pLAD, 60% apical LAD, 95% mLCx, 80% dLCx, diffuse moderate disease of nondominant RCA. Patient had DES to pLAD, mLCx, and dLCx. Echo (9/15) with EF 55-60%, no rWMAs.  He states that today while eating chicken wings he developed acute onset chest pain "pressure...like an ache" that was 7-8/10 in severity. Noted associated hand swelling, sweats, and a "swimmy headed" feeling. States pain is similar to prior MIs, bust less severe. Took multiple nitro sprays and pain improved. He called EMS. He was reportedly hypertensive to "300/169" upon EMS arrival. He was given NTG in the truck and BP on arrival was 143/90. ECG unchanged. Initial troponin was negative.   Prior to the above episode, he was doing well and walked 55mi which is typical for him. He also has a history of stage IV SCC of the tonsils and had extensive chemo, surgery. He is doing fairly well from that perspective.   Home Medications  Prior to Admission medications   Medication Sig Start Date End Date Taking? Authorizing Provider  aspirin 81 MG tablet Take 81 mg by mouth every morning.    Yes Historical Provider, MD  clopidogrel (PLAVIX) 75 MG tablet Take 1 tablet (75 mg total) by mouth daily. 11/24/13  Yes Scott Joylene Draft, PA-C  diazepam (VALIUM) 10 MG tablet Take 10 mg by mouth 3 (three) times daily.    Yes Historical Provider, MD  HYDROcodone-acetaminophen (NORCO) 10-325 MG per tablet Take 1 tablet by mouth every 6 (six) hours as needed for moderate pain.   Yes Historical Provider, MD  levothyroxine (SYNTHROID, LEVOTHROID) 100 MCG tablet Take 100 mcg by mouth daily.     Yes Historical Provider, MD  metoprolol tartrate (LOPRESSOR) 25 MG tablet Take 0.5 tablets (12.5 mg total) by mouth 2 (two)  times daily. 06/30/13  Yes Larey Dresser, MD  nitroGLYCERIN (NITROSTAT) 0.4 MG SL tablet Place 1 tablet (0.4 mg total) under the tongue every 5 (five) minutes x 3 doses as needed for chest pain. 10/11/12  Yes Scott T Kathlen Mody, PA-C  pantoprazole (PROTONIX) 40 MG tablet Take 1 tablet (40 mg total) by mouth 2 (two) times daily. Patient taking differently: Take 40 mg by mouth every morning.  10/11/12  Yes Scott T Kathlen Mody, PA-C  pravastatin (PRAVACHOL) 40 MG tablet Take 1 tablet (40 mg total) by mouth every evening. 02/26/14  Yes Larey Dresser, MD  traZODone (DESYREL) 150 MG tablet Take 150 mg  by mouth at bedtime.  04/17/12  Yes Historical Provider, MD    Family History  Family History  Problem Relation Age of Onset  . Colon cancer Maternal Uncle   . Heart disease Maternal Grandmother   . Cirrhosis Maternal Grandfather     alcoholic  . Cirrhosis Maternal Uncle     alcoholic  . Hypothyroidism Mother   . Goiter Mother   . Heart attack Mother   . Heart attack Paternal Grandmother     Social History  History   Social History  . Marital Status: Divorced    Spouse Name: N/A    Number of Children: N/A  . Years of Education: N/A   Occupational History  . Disabled    Social History Main Topics  . Smoking status: Former Smoker -- 1.50 packs/day for 20 years    Types: Cigarettes    Quit date: 10/24/2013  . Smokeless tobacco: Never Used  . Alcohol Use: No  . Drug Use: Yes    Special: Marijuana     Comment: 10/10/2012 "I smoked a little grass while I was going thru chemo to help me w/my appetite"  . Sexual Activity: Not Currently   Other Topics Concern  . Not on file   Social History Narrative   Former Development worker, community, he is disabled. Pt lives alone, has a fiance.     Review of Systems General:  No chills, fever, night sweats or weight changes.  Cardiovascular:  See HPI Dermatological: No rash, lesions/masses Respiratory: No cough, dyspnea Urologic: No hematuria, dysuria Abdominal:   No  nausea, vomiting, diarrhea, bright red blood per rectum, melena, or hematemesis Neurologic:  No visual changes, wkns, changes in mental status. All other systems reviewed and are otherwise negative except as noted above.  Physical Exam  Blood pressure 164/107, pulse 71, temperature 97.9 F (36.6 C), temperature source Oral, resp. rate 17, height 6\' 2"  (1.88 m), weight 82.283 kg (181 lb 6.4 oz), SpO2 98 %.  General: Pleasant, NAD Psych: Normal affect. Odd affect Neuro: Alert and oriented X 3. Moves all extremities spontaneously. HEENT: Normal  Neck: Supple without bruits or JVD. Lungs:  Resp regular and unlabored, CTA. Heart: RRR no s3, s4, or murmurs. Abdomen: Soft, non-tender, non-distended, BS + x 4.  Extremities: No clubbing, cyanosis or edema. DP/PT/Radials 2+ and equal bilaterally.  Labs  Troponin [facility] of Care Test)  Recent Labs  04/06/14 2358  TROPIPOC 0.00   No results for input(s): CKTOTAL, CKMB, TROPONINI in the last 72 hours. Lab Results  Component Value Date   WBC 5.2 04/06/2014   HGB 16.0 04/06/2014   HCT 44.9 04/06/2014   MCV 91.4 04/06/2014   PLT 142* 04/06/2014    Recent Labs Lab 04/06/14 2353  NA 140  K 3.8  CL 107  CO2 26  BUN 15  CREATININE 1.02  CALCIUM 9.6  GLUCOSE 100*   Lab Results  Component Value Date   CHOL 172 11/10/2013   HDL 52 11/10/2013   LDLCALC 105* 11/10/2013   TRIG 76 11/10/2013   No results found for: DDIMER   Radiology/Studies  Dg Chest 2 View  04/07/2014   CLINICAL DATA:  High blood pressure. Swelling in the hands. Headache. Chest pain.  EXAM: CHEST  2 VIEW  COMPARISON:  11/09/2013  FINDINGS: Normal heart size and pulmonary vascularity. No focal airspace disease or consolidation in the lungs. No blunting of costophrenic angles. No pneumothorax. Mediastinal contours appear intact. Postoperative changes in the cervical spine. Mild degenerative  changes in the thoracic spine. Coronary artery stent.  IMPRESSION: No active  cardiopulmonary disease.   Electronically Signed   By: Lucienne Capers M.D.   On: 04/07/2014 01:48    ECG  NSR. Early transition. No change from prior.   ASSESSMENT AND PLAN  20M with extensive CAD history with a somewhat atypical story that is however consistent with prior MI sx.  Will plan to admit and cycle cardiac enzymes. Consider stress test in AM based on how he does overnight.   Tobi Bastos, MD 04/07/2014, 5:55 AM

## 2014-04-07 NOTE — Progress Notes (Signed)
UR completed 

## 2014-04-07 NOTE — Progress Notes (Addendum)
Patient is having nausea and vomiting and he is shaky and sweaty. Emesis is yellow and clear about 150 mL.  Vitals taken.   Blood pressure is elevated at 187/170.  Manual blood pressure 165/110.  Physician notified.  Stat EKG ordered.

## 2014-04-07 NOTE — Progress Notes (Signed)
Spoke with Dr. Tana Coast who will be consulting on the patient. She advised to give 2mg  IV ativan now. We appreciate her assistance! Nasir Bright PA-C

## 2014-04-08 DIAGNOSIS — R079 Chest pain, unspecified: Secondary | ICD-10-CM | POA: Insufficient documentation

## 2014-04-08 DIAGNOSIS — F4310 Post-traumatic stress disorder, unspecified: Secondary | ICD-10-CM

## 2014-04-08 DIAGNOSIS — C099 Malignant neoplasm of tonsil, unspecified: Secondary | ICD-10-CM

## 2014-04-08 DIAGNOSIS — Z955 Presence of coronary angioplasty implant and graft: Secondary | ICD-10-CM

## 2014-04-08 LAB — BASIC METABOLIC PANEL
Anion gap: 8 (ref 5–15)
BUN: 12 mg/dL (ref 6–23)
CO2: 26 mmol/L (ref 19–32)
CREATININE: 1.02 mg/dL (ref 0.50–1.35)
Calcium: 9.3 mg/dL (ref 8.4–10.5)
Chloride: 103 mmol/L (ref 96–112)
GFR calc Af Amer: >90 mL/min (ref >90)
GFR, EST NON AFRICAN AMERICAN: 83 mL/min — AB (ref >90)
Glucose, Bld: 120 mg/dL — ABNORMAL HIGH (ref 70–99)
Potassium: 4.3 mmol/L (ref 3.5–5.1)
SODIUM: 137 mmol/L (ref 135–145)

## 2014-04-08 MED ORDER — METOPROLOL TARTRATE 25 MG PO TABS
25.0000 mg | ORAL_TABLET | Freq: Two times a day (BID) | ORAL | Status: DC
  Administered 2014-04-08: 25 mg via ORAL
  Filled 2014-04-08 (×2): qty 1

## 2014-04-08 MED ORDER — LEVOTHYROXINE SODIUM 125 MCG PO TABS: 125.0000 ug | ORAL_TABLET | Freq: Every day | ORAL | Status: DC

## 2014-04-08 NOTE — Progress Notes (Signed)
Patient: Bobby Floyd / Admit Date: 04/06/2014 / Date of Encounter: 04/08/2014, 8:11 AM   Subjective: Feeling much better. No more pain or complaints.   Objective: Telemetry: NSR/SB Physical Exam: Blood pressure 97/60, pulse 61, temperature 97.5 F (36.4 C), temperature source Oral, resp. rate 128, height 6\' 2"  (1.88 m), weight 181 lb 6.4 oz (82.283 kg), SpO2 99 %. General: Well developed, well nourished WM, in no acute distress. Head: Normocephalic, atraumatic, sclera non-icteric, no xanthomas, nares are without discharge. Neck: Negative for carotid bruits. JVP not elevated. Lungs: Clear bilaterally to auscultation without wheezes, rales, or rhonchi. Breathing is unlabored. Heart: RRR S1 S2 without murmurs, rubs, or gallops.  Abdomen: Soft, non-tender, non-distended with normoactive bowel sounds. No rebound/guarding. Extremities: No clubbing or cyanosis. No edema. Distal pedal pulses are 2+ and equal bilaterally. Neuro: Alert and oriented X 3. Moves all extremities spontaneously. Calm. Psych:  Responds to questions appropriately with a normal affect.   Intake/Output Summary (Last 24 hours) at 04/08/14 0811 Last data filed at 04/07/14 1300  Gross per 24 hour  Intake      0 ml  Output      0 ml  Net      0 ml    Inpatient Medications:  . ALPRAZolam  1 mg Oral TID  . aspirin EC  81 mg Oral Daily  . clopidogrel  75 mg Oral Daily  . enoxaparin (LOVENOX) injection  40 mg Subcutaneous Q24H  . levothyroxine  125 mcg Oral QAC breakfast  . metoprolol tartrate  50 mg Oral BID  . pantoprazole  40 mg Oral BH-q7a  . traZODone  150 mg Oral QHS   Infusions:    Labs:  Recent Labs  04/06/14 2353 04/07/14 0535 04/07/14 0951  NA 140 137  --   K 3.8 3.5  --   CL 107 103  --   CO2 26 27  --   GLUCOSE 100* 104*  --   BUN 15 13  --   CREATININE 1.02 1.06  --   CALCIUM 9.6 9.1  --   MG  --   --  2.1    Recent Labs  04/07/14 1240  AST 24  ALT 13  ALKPHOS 67  BILITOT  1.3*  PROT 6.7  ALBUMIN 4.3    Recent Labs  04/06/14 2353 04/07/14 0535  WBC 5.2 7.1  HGB 16.0 16.5  HCT 44.9 47.9  MCV 91.4 91.6  PLT 142* 124*    Recent Labs  04/07/14 0535 04/07/14 0951 04/07/14 1627  TROPONINI <0.03 <0.03 <0.03   Invalid input(s): POCBNP No results for input(s): HGBA1C in the last 72 hours.   Radiology/Studies:  Dg Chest 2 View  04/07/2014   CLINICAL DATA:  High blood pressure. Swelling in the hands. Headache. Chest pain.  EXAM: CHEST  2 VIEW  COMPARISON:  11/09/2013  FINDINGS: Normal heart size and pulmonary vascularity. No focal airspace disease or consolidation in the lungs. No blunting of costophrenic angles. No pneumothorax. Mediastinal contours appear intact. Postoperative changes in the cervical spine. Mild degenerative changes in the thoracic spine. Coronary artery stent.  IMPRESSION: No active cardiopulmonary disease.   Electronically Signed   By: Lucienne Capers M.D.   On: 04/07/2014 01:48     Assessment and Plan  1. Acute benzodiazepine withdrawals (manifested by acute anxiety, chills, diaphoresis, leg cramping, tremulousness) with history of depression/anxiety, PTSD - appreciate IM input - pt was actually taking Xanax at home, not Valium, and missed some of  his doses (history given to different providers was variable - may have been out for up to 2 days) - UDS + THC (also opiates/benzo but received these in house) - await IM input regarding plans for discharge (from cardiac standpoint he is OK to d/c home)  2. Chest comfort, resolved , ruled out for MI, troponins negative, may have been due to HTN - EKG normal during episode of CP - CXR with normal mediastinal contours - anticipate continued observation for further sx  3. Hypertension (usually normtensive at home), labile - likely due to component of benzo withdrawal - since BP soft this AM, will drop metoprolol back down to 25mg  BID (was 12.5mg  BID at home but increased by IM yesterday to  50mg  BID)  4. Known CAD (1st MI age 78, NSTEMI 2013 s/p PTCA OM2, 11/2013: s/p DES-pLAD/mLCx/dLCx) 5. Stage IV tonsillar cancer 6. Hypothyroidism, adjusted per IM  Keep f/u as scheduled with Dr. Aundra Dubin in April.  Signed, Melina Copa PA-C  Agree with findings by Melina Copa PA-C  OK for D/C per cards. Benzo W/D. ROV with Dr. Aundra Dubin.   Lorretta Harp, M.D., [place], [facility], [place] [address] [place], [place]  [id number]  [phone] 04/08/2014 10:17 AM

## 2014-04-08 NOTE — Progress Notes (Signed)
Incorrect should be 18

## 2014-04-08 NOTE — Discharge Instructions (Signed)

## 2014-04-08 NOTE — Consult Note (Signed)
[facility] Consult   Reason for Consult:  Shortness of breath and Xanax withdrawal Referring Physician:  Dr. Tana Coast Patient Identification: Bobby Floyd MRN:  [id number] Principal Diagnosis: Posttraumatic stress disorder Diagnosis:   Patient Active Problem List   Diagnosis Date Noted  . Pain in the chest [R07.9]   . Benzodiazepine withdrawal [F13.239] 04/07/2014  . Angina, class III [I20.8] 11/10/2013  . S/P coronary artery stent placement - DES x 2 to dominant CFX and DES to LAD 11/10/2013 [Z95.5] 11/10/2013  . Chest pain [R07.9] 11/09/2013  . Coronary Artery Disease [I25.10, Z98.61] 11/09/2013  . Protein-calorie malnutrition, severe [E43] 10/11/2012  . Chest pain, unspecified [R07.9] 10/11/2012  . Tobacco use [Z72.0]   . Hypercholesterolemia [E78.0] 07/10/2011  . Hypothyroidism [E03.9] 06/27/2011  . Anxiety [F41.9] 06/27/2011  . H/O Non-ST elevation myocardial infarction (NSTEMI), subsequent episode of care [I22.2, I21.4] 06/25/2011  . Personal history of colonic adenoma [Z86.010] 03/28/2011  . Special screening for malignant neoplasms, colon [Z12.11] 03/26/2011  . Benign neoplasm of colon [D12.6] 03/26/2011  . Dysphagia, pharyngoesophageal phase [R13.14] 03/26/2011  . History of Tonsillar cancer, stage IV squamous cell left tonsil [C09.9] 03/15/2011    Class: History of  . GERD (gastroesophageal reflux disease) [K21.9] 03/15/2011    Total Time spent with patient: 45 minutes  Subjective:   Bobby Floyd is a 54 y.o. male patient admitted with increased anxiety.  HPI:  Bobby Floyd is a 54 years old male seen for psychiatric consultation and evaluation of increased anxiety. Patient reported he has been suffering with posttraumatic stress disorder, anxiety, depression since he lost his son 2009. Patient has been receiving medication management from his primary care physician he also suffering with multiple medical problems including throat cancer.  Patient reportedly taking Xanax 2 mg at bedtime even though it was recommended 1 mg 3 times a day. Patient appeared with a serious symptoms of anxiety, shakiness, tremulousness severity and also vomits with increased or elevated blood pressure. Patient symptoms has been subsided with the resuming his medication Xanax 1 mg 3 times a day. Patient has no history of substance abuse. Patient stated he is compliant with his a medication regimen and it was organized in [employer]. Patient denies current symptoms of depression, anxiety, suicidal or homicidal ideation, intention or plans. Patient has no evidence of psychosis. Patient has been doing well with his current medication regimen and requested to keep as it is. Patient contract for safety at this time.  Medical history: Patient is a 54 year old male with extensive CAD, first MI at age of 64, NSTEMI 4/13, unstable angina, 9/15, cardiac cath showed 70-80% proximal LAD, 60% apical LAD, 95% circumflex, 80% distal circumflex, diffuse moderate disease of nondominant RCA, stent to proximal LAD, mid circumflex and distal circumflex. Patient has hypertension, hyperlipidemia, hypothyroidism, PTSD/anxiety/depression (from witnessing his son's death and decapitation in a car wreck in 2009), takes Xanax daily. Patient currently admitted under cardiology service for chest pain. He was also reportedly hypotensive '300/169' upon EMS arrival. Patient also has history of stage IV SCC tonsils, has extensive chemotherapy and surgery, some residual dysphagia. Consult was called as patient was noticed to be very anxious, shaky, tremulous and sweaty, vomited this morning blood pressure elevated at 187/170. At the time of my encounter, patient noticed to be tremulous and diaphoretic, complaining of nausea, leg cramps. Listed on his outpatient medication is Valium 10mg  TID, which patient was not started on yet however when I questioned the patient he reports that he  does not take  Valium. He reports that he takes Xanax 1mg  AM, 2mg  qhs (prescribed as 1mg  TID PRN) and I verified the medication from his pill bottle in his bag. Patient also reported to the rapid response nurse that he does not take Valium.  HPI Elements:   Location:  Anxiety. Quality:  Fair. Severity:  Shortness of breath. Timing:  Unknown. Duration:  Few days. Context:  Questionable medication mismanagement.  Past Medical History:  Past Medical History  Diagnosis Date  . Hypothyroidism   . Degenerative joint disease of cervical spine   . GERD (gastroesophageal reflux disease)   . Dyslipidemia (high LDL; low HDL)   . Chemotherapy-induced neuropathy   . Myocardial infarction 2004; 2007; 2013  . Anemia   . History of blood transfusion 2009    "after throat cancer OR" (10/10/2012)  . 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 Depression   . Basal cell carcinoma of skin   . Melanoma     "right hand or forearm" (10/10/2012)  . Tonsillar cancer     Squamous cell, on the left, stage IV; radiation therapy 10/21/07-12/11/07  . Squamous cell carcinoma     "forearms, hands, head, nose" (10/10/2012)  . Complication of anesthesia     woke up violent a couple of times "  . History of echocardiogram     Echo (11/10/13):  Mod LVH, EF 55-60%, no RWMA, mild RAE.  Marland Kitchen Coronary artery disease     a. NSTEMI 4/13 >>> PCI:  BMS to OM2;  b.  Nuclear (8/14):  Apical thinning, no ischemia, EF 50%; NORMAL;  c.  [place]:  LHC (9/15):  EF 60-65%, ostial LAD 20% followed by 70-80%, mid LAD 40%, apical LAD 60%, mid CFX 95-99%, AVCFX 80%, prox OM1 20%, OM2 PTCA site patent, RCA 60-70% >>> PCI:  Promus Premier DES x 2 to CFX and Xience Alpine DES to prox to mid LAD    Past Surgical History  Procedure Laterality Date  . Tonsillectomy Left 2009    Dr. Silvio Clayman [facility]  . Posterior fusion cervical spine  September 2012    C5-6  . Pharyngectomy Left 2009    Dr. Silvio Clayman  . Partial glossectomy Left     Dr.  Silvio Clayman  . Neck dissection Left     Dr. Silvio Clayman  . Multiple tooth extractions  2009  . Skin cancer excision      Multiple squamous and basal cell carcinomas  . Melanoma excision Right     forearm  . Cervical discectomy  2010    C 5-6  . Esophagogastroduodenoscopy  03/26/2011    Procedure: ESOPHAGOGASTRODUODENOSCOPY (EGD);  Surgeon: Gatha Mayer, MD;  Location: [facility];  Service: Endoscopy;  Laterality: N/A;  . Balloon dilation  03/26/2011    Procedure: BALLOON DILATION;  Surgeon: Gatha Mayer, MD;  Location: [facility] ENDOSCOPY;  Service: Endoscopy;  Laterality: N/A;  . Colonoscopy  03/26/2011    Procedure: COLONOSCOPY;  Surgeon: Gatha Mayer, MD;  Location: [facility] ENDOSCOPY;  Service: Endoscopy;  Laterality: N/A;  . Coronary angioplasty  06/2011    LAD 40%, OM1 60%, small OM 2 thrombotic treated with PTCA to 20%, RCA occluded but recanalized, EF 50%  . Carotid endarterectomy Left     "I've got a stent" (10/10/2012)  . Coronary stent placement  11/10/2013    DES x 2 CFX, DES x 1 LAD  . Left heart catheterization with coronary  angiogram N/A 06/25/2011    Procedure: LEFT HEART CATHETERIZATION WITH CORONARY ANGIOGRAM;  Surgeon: Hillary Bow, MD;  Location[facility] CATH LAB;  Service: Cardiovascular;  Laterality: N/A;  . Percutaneous coronary intervention-balloon only  06/25/2011    Procedure: PERCUTANEOUS CORONARY INTERVENTION-BALLOON ONLY;  Surgeon: Hillary Bow, MD;  Location: [facility] CATH LAB;  Service: Cardiovascular;;  . Left heart catheterization with coronary angiogram N/A 11/10/2013    Procedure: LEFT HEART CATHETERIZATION WITH CORONARY ANGIOGRAM;  Surgeon: Leonie Man, MD;  Location: [facility] CATH LAB;  Service: Cardiovascular;  Laterality: N/A;   Family History:  Family History  Problem Relation Age of Onset  . Colon cancer Maternal Uncle   . Heart disease Maternal Grandmother   . Cirrhosis Maternal Grandfather     alcoholic  . Cirrhosis Maternal Uncle     alcoholic  . Hypothyroidism  Mother   . Goiter Mother   . Heart attack Mother   . Heart attack Paternal Grandmother    Social History:  History  Alcohol Use No     History  Drug Use  . Yes  . Special: Marijuana    Comment: 10/10/2012 "I smoked a little grass while I was going thru chemo to help me w/my appetite"    History   Social History  . Marital Status: Divorced    Spouse Name: N/A    Number of Children: N/A  . Years of Education: N/A   Occupational History  . Disabled    Social History Main Topics  . Smoking status: Former Smoker -- 1.50 packs/day for 20 years    Types: Cigarettes    Quit date: 10/24/2013  . Smokeless tobacco: Never Used  . Alcohol Use: No  . Drug Use: Yes    Special: Marijuana     Comment: 10/10/2012 "I smoked a little grass while I was going thru chemo to help me w/my appetite"  . Sexual Activity: Not Currently   Other Topics Concern  . None   Social History Narrative   Former Development worker, community, he is disabled. Pt lives alone, has a fiance.   Additional Social History:                          Allergies:   Allergies  Allergen Reactions  . Lipitor [Atorvastatin] Swelling and Other (See Comments)    Urination problems, myalgias also  . Other Other (See Comments)    Possible resistance to all narcotic pain meds-dilaudid might work  . Propofol Other (See Comments)    "violent"  . Benadryl [Diphenhydramine Hcl] Swelling    Hyperactivity, very   . Ondansetron Other (See Comments)    Headaches  . Promethazine Hcl Nausea And Vomiting    Vitals: Blood pressure 97/60, pulse 61, temperature 97.5 F (36.4 C), temperature source Oral, resp. rate 18, height 6\' 2"  (1.88 m), weight 82.283 kg (181 lb 6.4 oz), SpO2 99 %.  Risk to Self: Is patient at risk for suicide?: No Risk to Others:   Prior Inpatient Therapy:   Prior Outpatient Therapy:    Current Facility-Administered Medications  Medication Dose Route Frequency Provider Last Rate Last Dose  . acetaminophen  (TYLENOL) tablet 650 mg  650 mg Oral Q4H PRN Lamar Sprinkles, MD      . ALPRAZolam Duanne Moron) tablet 1 mg  1 mg Oral TID Ripudeep Krystal Eaton, MD   1 mg at 04/08/14 0939  . aspirin EC tablet 81 mg  81 mg Oral Daily Dayna  N Dunn, PA-C   81 mg at 04/08/14 0940  . clopidogrel (PLAVIX) tablet 75 mg  75 mg Oral Daily Lamar Sprinkles, MD   75 mg at 04/08/14 0940  . enoxaparin (LOVENOX) injection 40 mg  40 mg Subcutaneous Q24H Lamar Sprinkles, MD   40 mg at 04/08/14 0941  . hydrALAZINE (APRESOLINE) injection 10 mg  10 mg Intravenous Q8H PRN Dayna N Dunn, PA-C   10 mg at 04/07/14 1056  . HYDROcodone-acetaminophen (NORCO) 10-325 MG per tablet 1 tablet  1 tablet Oral Q6H PRN Lamar Sprinkles, MD      . levothyroxine (SYNTHROID, LEVOTHROID) tablet 125 mcg  125 mcg Oral QAC breakfast Ripudeep Krystal Eaton, MD   125 mcg at 04/08/14 0940  . LORazepam (ATIVAN) injection 1 mg  1 mg Intravenous Q4H PRN Ripudeep K Rai, MD      . metoprolol tartrate (LOPRESSOR) tablet 25 mg  25 mg Oral BID Dayna N Dunn, PA-C   25 mg at 04/08/14 0940  . nitroGLYCERIN (NITROSTAT) SL tablet 0.4 mg  0.4 mg Sublingual Q5 Min x 3 PRN Lamar Sprinkles, MD      . ondansetron [facility]) injection 4 mg  4 mg Intravenous Q6H PRN Lamar Sprinkles, MD      . pantoprazole (PROTONIX) EC tablet 40 mg  40 mg Oral Patsy Lager, MD   40 mg at 04/08/14 0701  . traZODone (DESYREL) tablet 150 mg  150 mg Oral QHS Lamar Sprinkles, MD   150 mg at 04/07/14 2355    Musculoskeletal: Strength & Muscle Tone: within normal limits Gait & Station: unable to stand Patient leans: N/A  Psychiatric Specialty Exam: Physical Exam as per history and physical   ROS increased anxiety   Blood pressure 97/60, pulse 61, temperature 97.5 F (36.4 C), temperature source Oral, resp. rate 18, height 6\' 2"  (1.88 m), weight 82.283 kg (181 lb 6.4 oz), SpO2 99 %.Body mass index is 23.28 kg/(m^2).  General Appearance: Casual  Eye Contact::  Good  Speech:  Clear and Coherent  Volume:   Decreased  Mood:  Anxious and Depressed  Affect:  Appropriate and Congruent  Thought Process:  Coherent and Goal Directed  Orientation:  Full (Time, Place, and Person)  Thought Content:  Rumination  Suicidal Thoughts:  No  Homicidal Thoughts:  No  Memory:  Immediate;   Fair Recent;   Good  Judgement:  Intact  Insight:  Good  Psychomotor Activity:  Decreased  Concentration:  Good  Recall:  Good  Fund of Knowledge:Good  Language: Good  Akathisia:  NA  Handed:  Right  AIMS (if indicated):     Assets:  Communication Skills Desire for Improvement Financial Resources/Insurance Housing Leisure Time Resilience Social Support  ADL's:  Intact  Cognition: WNL  Sleep:      Medical Decision Making: Review of Psycho-Social Stressors (1), Review or order clinical lab tests (1), Established Problem, Worsening (2), Review or order medicine tests (1), Independent Review of image, tracing or specimen (2), Review of Medication Regimen & Side Effects (2) and Review of New Medication or Change in Dosage (2)  Treatment Plan Summary: Daily contact with patient to assess and evaluate symptoms and progress in treatment and Medication management  Plan: Continue current treatment plan with medication management No evidence of imminent risk to self or others at present.   Patient does not meet criteria for psychiatric inpatient admission. Supportive therapy provided about ongoing stressors.  Disposition: Patient will be referred to the outpatient psychiatric services  and medically stable. Psychiatric consultation follow-up as clinically required.  Moncerrath Berhe,JANARDHAHA R. 04/08/2014 11:22 AM

## 2014-04-08 NOTE — Discharge Summary (Signed)
Physician Discharge Summary  Bobby Floyd IOE:[id number] DOB: 10/04/1960 DOA: 04/06/2014  PCP: Rochel Brome, MD  Admit date: 04/06/2014 Discharge date: 04/08/2014  Time spent: 35 minutes  Recommendations for Outpatient Follow-up:  Patient will be discharged to home.  He should follow upw ith is primary care physician within one week of discharge as well as his oncologist. Patient should continue taking his medications as prescribed.  Patient should follow a heart healthy diet and continue activity as tolerated.  Patient will need to have his TSH and free T4 monitored by his PCP within 4 weeks.   Discharge Diagnoses:  Active Problems:   History of Tonsillar cancer, stage IV squamous cell left tonsil   Dysphagia, pharyngoesophageal phase   Hypothyroidism   Anxiety   Hypercholesterolemia   Chest pain   S/P coronary artery stent placement - DES x 2 to dominant CFX and DES to LAD 11/10/2013   Benzodiazepine withdrawal   Discharge Condition: Stable  Diet recommendation: Heart healthy  Filed Weights   04/06/14 2330 04/07/14 0429  Weight: 86.183 kg (190 lb) 82.283 kg (181 lb 6.4 oz)    History of present illness:  By Dr. Lamar Sprinkles on 04/07/2014 54 yo with history of extensive CAD presents with CP. 1st MI at 13. NSTEMI 4/13 with PTCA OM2 (nondominant right was subtotally occluded). Unstable angina 9/15 with LHC showing 70-80% pLAD, 60% apical LAD, 95% mLCx, 80% dLCx, diffuse moderate disease of nondominant RCA. Patient had DES to pLAD, mLCx, and dLCx. Echo (9/15) with EF 55-60%, no rWMAs.  He states that today while eating chicken wings he developed acute onset chest pain "pressure...like an ache" that was 7-8/10 in severity. Noted associated hand swelling, sweats, and a "swimmy headed" feeling. States pain is similar to prior MIs, bust less severe. Took multiple nitro sprays and pain improved. He called EMS. He was reportedly hypertensive to "300/169" upon EMS arrival. He  was given NTG in the truck and BP on arrival was 143/90. ECG unchanged. Initial troponin was negative.   Prior to the above episode, he was doing well and walked 74mi which is typical for him. He also has a history of stage IV SCC of the tonsils and had extensive chemo, surgery. He is doing fairly well from that perspective.  Hospital Course:  Chest pain/CAD -Cardiology admitted and was primary -Pt has history of CAD with multiple stents placed in 11/2013 -Troponins remain negative, EKG was normal during episode of chest pain -Chest x-ray normal -Continue plavix, aspirin, metoprolol, statin  Acute benzodiazepine withdrawal with history of depression/anxiety/PTSD -Patient had not taken his Xanax and over 24 hours, there was confusion regarding patient's dosages and timing. -Patient was given Ativan 2 mg IV and his symptoms improved drastically -Continue home dose of Xanax upon discharge -Patient will need to follow-up with his oncologist who has placed him on the Xanax. -Patient does not feel that he needs to see a psychiatrist at this time. He does not wish to be placed on Klonopin as he feels it is too strong. -Valium was discontinued  Polysubstance abuse -Urine drug screen +THC -patient counseled  Hypothyroidism -TSH 7.35, levothyroxine was increased 125 g daily -Patient will need follow-up in 4 weeks with his primary care physician  History of tonsillar cancer stage IV, squamous cell -Patient will need follow-up with his oncologist  Mild hypokalemia with leg cramping  Hyperlipidemia -Continue statin -LDL 100  Accelerated hypertension -Resolved, likely secondary to benzodiazepine withdrawal -Continue home medications  Procedures: None  Consultations:  Cardiology was primary [place] was asked to discharge  Discharge Exam: Filed Vitals:   04/08/14 0700  BP:   Pulse:   Temp:   Resp: 18     General: Well developed, NAD, appears stated age  HEENT: NCAT, mucous  membranes moist.  Cardiovascular: S1 S2 auscultated, no rubs, murmurs or gallops. Regular rate and rhythm.  Respiratory: Clear to auscultation bilaterally with equal chest rise  Abdomen: Soft, nontender, nondistended, + bowel sounds  Extremities: warm dry without cyanosis clubbing or edema  Neuro: AAOx3, nonfocal  Psych: Normal affect and demeanor with intact judgement and insight  Discharge Instructions      Discharge Instructions    Discharge instructions    Complete by:  As directed   Patient will be discharged to home.  He should follow upw ith is primary care physician within one week of discharge as well as his oncologist. Patient should continue taking his medications as prescribed.  Patient should follow a heart healthy diet and continue activity as tolerated.  Patient will need to have his TSH and free T4 monitored by his PCP within 4 weeks.            Medication List    TAKE these medications        ALPRAZolam 0.5 MG tablet  Commonly known as:  XANAX  Take 0.5 mg by mouth 3 (three) times daily.     aspirin 81 MG tablet  Take 81 mg by mouth every morning.     clopidogrel 75 MG tablet  Commonly known as:  PLAVIX  Take 1 tablet (75 mg total) by mouth daily.     HYDROcodone-acetaminophen 10-325 MG per tablet  Commonly known as:  NORCO  Take 1 tablet by mouth every 6 (six) hours as needed for moderate pain.     levothyroxine 125 MCG tablet  Commonly known as:  SYNTHROID, LEVOTHROID  Take 1 tablet (125 mcg total) by mouth daily before breakfast.     metoprolol tartrate 25 MG tablet  Commonly known as:  LOPRESSOR  Take 0.5 tablets (12.5 mg total) by mouth 2 (two) times daily.     nitroGLYCERIN 0.4 MG SL tablet  Commonly known as:  NITROSTAT  Place 1 tablet (0.4 mg total) under the tongue every 5 (five) minutes x 3 doses as needed for chest pain.     pantoprazole 40 MG tablet  Commonly known as:  PROTONIX  Take 1 tablet (40 mg total) by mouth 2 (two) times  daily.     pravastatin 40 MG tablet  Commonly known as:  PRAVACHOL  Take 1 tablet (40 mg total) by mouth every evening.     traZODone 150 MG tablet  Commonly known as:  DESYREL  Take 150 mg by mouth at bedtime.       Allergies  Allergen Reactions  . Lipitor [Atorvastatin] Swelling and Other (See Comments)    Urination problems, myalgias also  . Other Other (See Comments)    Possible resistance to all narcotic pain meds-dilaudid might work  . Propofol Other (See Comments)    "violent"  . Benadryl [Diphenhydramine Hcl] Swelling    Hyperactivity, very   . Ondansetron Other (See Comments)    Headaches  . Promethazine Hcl Nausea And Vomiting   Follow-up Information    Follow up with Rochel Brome, MD. Schedule an appointment as soon as possible for a visit in 1 week.   Specialty:  Family Medicine   Why:  Hospital followup   Contact information:  [place] [address] [phone]        The results of significant diagnostics from this hospitalization (including imaging, microbiology, ancillary and laboratory) are listed below for reference.    Significant Diagnostic Studies: Dg Chest 2 View  04/07/2014   CLINICAL DATA:  High blood pressure. Swelling in the hands. Headache. Chest pain.  EXAM: CHEST  2 VIEW  COMPARISON:  11/09/2013  FINDINGS: Normal heart size and pulmonary vascularity. No focal airspace disease or consolidation in the lungs. No blunting of costophrenic angles. No pneumothorax. Mediastinal contours appear intact. Postoperative changes in the cervical spine. Mild degenerative changes in the thoracic spine. Coronary artery stent.  IMPRESSION: No active cardiopulmonary disease.   Electronically Signed   By: Lucienne Capers M.D.   On: 04/07/2014 01:48    Microbiology: Recent Results (from the past 240 hour(s))  Culture, blood (routine x 2)     Status: None (Preliminary result)   Collection Time: 04/07/14 12:40 PM  Result Value Ref Range Status     Specimen Description BLOOD LEFT HAND  Final   Special Requests BOTTLES DRAWN AEROBIC ONLY 10CC  Final   Culture   Final           BLOOD CULTURE RECEIVED NO GROWTH TO DATE CULTURE WILL BE HELD FOR 5 DAYS BEFORE ISSUING A FINAL NEGATIVE REPORT Performed at [facility]    Report Status PENDING  Incomplete  Culture, blood (routine x 2)     Status: None (Preliminary result)   Collection Time: 04/07/14 12:50 PM  Result Value Ref Range Status   Specimen Description BLOOD RIGHT HAND  Final   Special Requests BOTTLES DRAWN AEROBIC ONLY [place]  Final   Culture   Final           BLOOD CULTURE RECEIVED NO GROWTH TO DATE CULTURE WILL BE HELD FOR 5 DAYS BEFORE ISSUING A FINAL NEGATIVE REPORT Performed at [facility]    Report Status PENDING  Incomplete     Labs: Basic Metabolic Panel:  Recent Labs Lab 04/06/14 2353 04/07/14 0535 04/07/14 0951 04/08/14 0730  NA 140 137  --  137  K 3.8 3.5  --  4.3  CL 107 103  --  103  CO2 26 27  --  26  GLUCOSE 100* 104*  --  120*  BUN 15 13  --  12  CREATININE 1.02 1.06  --  1.02  CALCIUM 9.6 9.1  --  9.3  MG  --   --  2.1  --    Liver Function Tests:  Recent Labs Lab 04/07/14 1240  AST 24  ALT 13  ALKPHOS 67  BILITOT 1.3*  PROT 6.7  ALBUMIN 4.3   No results for input(s): LIPASE, AMYLASE in the last 168 hours. No results for input(s): AMMONIA in the last 168 hours. CBC:  Recent Labs Lab 04/06/14 2353 04/07/14 0535  WBC 5.2 7.1  HGB 16.0 16.5  HCT 44.9 47.9  MCV 91.4 91.6  PLT 142* 124*   Cardiac Enzymes:  Recent Labs Lab 04/07/14 0535 04/07/14 0951 04/07/14 1627  TROPONINI <0.03 <0.03 <0.03   BNP: BNP (last 3 results) No results for input(s): PROBNP in the last 8760 hours. CBG: No results for input(s): GLUCAP in the last 168 hours.     SignedCristal Ford  [facility] 04/08/2014, 9:53 AM

## 2014-04-13 LAB — CULTURE, BLOOD (ROUTINE X 2)
CULTURE: NO GROWTH
Culture: NO GROWTH

## 2014-04-26 ENCOUNTER — Encounter: Payer: Self-pay | Admitting: Physician Assistant

## 2014-04-27 ENCOUNTER — Encounter: Payer: Self-pay | Admitting: Physician Assistant

## 2014-04-29 ENCOUNTER — Other Ambulatory Visit: Payer: Self-pay

## 2014-06-28 ENCOUNTER — Ambulatory Visit: Payer: Self-pay | Admitting: Cardiology

## 2014-07-14 ENCOUNTER — Other Ambulatory Visit: Payer: Self-pay

## 2014-07-14 DIAGNOSIS — J449 Chronic obstructive pulmonary disease, unspecified: Secondary | ICD-10-CM

## 2014-07-14 NOTE — Patient Outreach (Signed)
[facility]) Care Management  07/14/2014  Bobby Floyd 10/26/1960 [id number]   SUBJECTIVE:  Telephone call to patient regarding [facility] referral.  HIPAA verified with patient.  Discussed and offered [facility] Care management services to patient.  Patient verbally agreed to receive services. Patient reports he falls often.  Reports he has fallen approximately 6 times.  Patient reports he has a cane but does not use all the time. 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 Patient reports having neuropathy for 4 years. Patient states he is unable to take neuropathy medications due to intolerance/allergic reactions.  Patient states he was  in the emergency department approximately 1 1/2 week ago due to falling.  Patient denies any serious injury.  Patient states he was evaluated and discharged from the emergency department.  Patient states he was hospitalized in March 2016 with elevated blood pressure, chest pain, and vomiting.  Patient states his blood pressure medication was changed. Patient denies having home blood pressure monitor. Patient reports goes to doctors office for frequent blood pressure monitoring.  Patient unable to state medications due to not being at home at time of call. Patient states he was diagnosed with COPD at time of admission.  Patient reports he was placed on two inhaler but was unable to report name of medication.  Patient states samples were given.  Patient voiced concern about inability to afford medication due to financial instability.  Patient reports last primary MD and Cardiology visit 07/09/14. Patient states 07/09/14 is initial visit with cardiologist after stent placement.  Patient states he sees an oncologist every month.   ASSESSMENT:  Patient will benefit from Community case management and pharmacy referral.   Per EPIC note patient is being followed up for Stage IV Tonsillar Cancer.   PLANS:  RNCM will refer patient to [place] case manager and pharmacist.   Quinn Plowman RN,BSN,CCM [place] Coordinator [phone]

## 2014-07-15 ENCOUNTER — Other Ambulatory Visit: Payer: Self-pay | Admitting: Pharmacist

## 2014-07-15 NOTE — Patient Outreach (Signed)
[facility]) Care Management  [place]   07/15/2014  Bobby Floyd 05-12-1960 [id number]  Subjective: Bobby Floyd is a 54 y.o. male who was referred to the pharmacy program for medication assistance. I contacted patient and he reported that he was started on two inhalers after being diagnosed with COPD and that he is worried that he may not be able to afford them once he is out of his samples. He reports that he does not know that the copay would be but that he heard that they were very expensive.  His physician has provided him with samples of Breo Ellipta and ProAir. He currently has plenty left. He expects to continue to stay on samples provided by his physician until he sees a pulmonary specialist in June 2016.   Objective:   Current Medications: Current Outpatient Prescriptions  Medication Sig Dispense Refill  . albuterol (PROVENTIL HFA;VENTOLIN HFA) 108 (90 BASE) MCG/ACT inhaler Inhale 2 puffs into the lungs every 4 (four) hours as needed for wheezing or shortness of breath.    . ALPRAZolam (XANAX) 0.5 MG tablet Take 0.5 mg by mouth 3 (three) times daily.    Marland Kitchen amLODipine (NORVASC) 5 MG tablet Take 5 mg by mouth daily.    Marland Kitchen aspirin 81 MG tablet Take 81 mg by mouth every morning.     . clopidogrel (PLAVIX) 75 MG tablet Take 1 tablet (75 mg total) by mouth daily. 30 tablet 11  . Fluticasone Furoate-Vilanterol 100-25 MCG/INH AEPB Inhale 1 puff into the lungs daily.    Marland Kitchen HYDROcodone-acetaminophen (NORCO) 10-325 MG per tablet Take 1 tablet by mouth every 6 (six) hours as needed for moderate pain.    Marland Kitchen levothyroxine (SYNTHROID, LEVOTHROID) 125 MCG tablet Take 1 tablet (125 mcg total) by mouth daily before breakfast. 30 tablet 0  . metoprolol tartrate (LOPRESSOR) 25 MG tablet Take 0.5 tablets (12.5 mg total) by mouth 2 (two) times daily. 15 tablet 0  . nitroGLYCERIN (NITROSTAT) 0.4 MG SL tablet Place 1 tablet (0.4 mg total) under the tongue every 5 (five)  minutes x 3 doses as needed for chest pain. 25 tablet 12  . pantoprazole (PROTONIX) 40 MG tablet Take 1 tablet (40 mg total) by mouth 2 (two) times daily. (Patient taking differently: Take 40 mg by mouth every morning. ) 60 tablet 3  . pravastatin (PRAVACHOL) 40 MG tablet Take 1 tablet (40 mg total) by mouth every evening. 30 tablet 3  . predniSONE (DELTASONE) 5 MG tablet Take 5 mg by mouth daily with breakfast.    . traZODone (DESYREL) 150 MG tablet Take 150 mg by mouth at bedtime.      No current facility-administered medications for this visit.    Functional Status: In your present state of health, do you have any difficulty performing the following activities: 04/07/2014 11/10/2013  Hearing? N -  Vision? N -  Difficulty concentrating or making decisions? N -  Walking or climbing stairs? N -  Dressing or bathing? N -  Doing errands, shopping? N Y    Fall/Depression Screening: No flowsheet data found.  Assessment: 1. Medication assistance: patient not currently in need of any assistance. He need education on what his options were in the event that he was not able to afford his inhalers. Inhalers may change once he sees pulmonology.   Plan: 1. Medication assistance: will defer any program assistance at this time and close out of the pharmacy program. Patient verbalized understanding that his insurance will  likely cover a portion of the cost of the inhalers. If he is still unable to pay for the inhaler then he has my contact information to call me and I can try to help with resources. I do know that [employer] has a 30 day coupon but any longer term support is not covered for Medicare patients. Albuterol nebs are typically much cheaper than the inhaler.     Nicoletta Ba, PharmD, [place] [facility] Resident [phone]

## 2014-07-19 ENCOUNTER — Other Ambulatory Visit: Payer: Self-pay | Admitting: *Deleted

## 2014-07-19 ENCOUNTER — Encounter: Payer: Self-pay | Admitting: *Deleted

## 2014-07-19 NOTE — Patient Outreach (Signed)
[facility]) Care Management  07/19/2014  Bobby Floyd 1960/10/10 [id number]    Bobby Floyd is a 54 year old man referred to community care management for assistance with , high fall risk, hypertension, and reported  new diagnosis of COPD. Contacted the patient by phone, very talkative about his medical history. States that he goes to Dr. Alyse Low office at least once a week just to  gets his blood pressure checked, and plans to go on 5/10, reports that the new blood pressure medication that he recently started makes him have crazy dreams at night, encouraged to discuss this with his  MD on tomorrow. Patient has a car and reports being able to drive himself to appointments, and uses a cane at times.  Plan : Initial home visit planned for Monday, May 16 at 12 noon,to complete fall risk assessment , provide education on COPD and assess for further needs. Joylene Draft, RN, [place] Care Management [phone]

## 2014-07-20 ENCOUNTER — Ambulatory Visit: Payer: Self-pay | Admitting: Cardiology

## 2014-07-26 ENCOUNTER — Other Ambulatory Visit: Payer: Self-pay | Admitting: *Deleted

## 2014-07-26 ENCOUNTER — Encounter: Payer: Self-pay | Admitting: *Deleted

## 2014-07-26 VITALS — BP 108/70 | HR 68 | Resp 20

## 2014-07-26 DIAGNOSIS — R296 Repeated falls: Secondary | ICD-10-CM

## 2014-07-26 NOTE — Patient Outreach (Signed)
[facility]) Care Management   07/26/2014  Bobby Floyd 04-Oct-1960 [id number]  Bobby Floyd is an 54 y.o. male  Subjective: " Things are going pretty good on today"  Objective:   Review of Systems  Constitutional: Negative.   HENT: Negative.   Eyes: Negative.   Respiratory: Positive for cough. Negative for sputum production.   Cardiovascular: Negative.   Gastrointestinal: Negative.   Musculoskeletal: Positive for falls.  Skin:       Bruised areas bilateral arms and lower legs  Neurological: Negative.   Psychiatric/Behavioral: Negative.     Physical Exam  Constitutional: He is oriented to person, place, and time. He appears well-developed.  HENT:  Head: Normocephalic.  Cardiovascular: Normal rate, regular rhythm and normal heart sounds.   Respiratory: Effort normal and breath sounds normal.  Congested sounding cough, non productive  GI: Soft.  Neurological: He is alert and oriented to person, place, and time.  Skin: Skin is warm and dry.     Patient states that he bruises easily, small bruised areas noted on bilateral arms and lower legs with skin intact  Psychiatric: He has a normal mood and affect. Cognition and memory are normal.   BP 108/70 mmHg  Pulse 68  Resp 20  SpO2 98%  Current Medications:   Current Outpatient Prescriptions  Medication Sig Dispense Refill  . albuterol (PROVENTIL HFA;VENTOLIN HFA) 108 (90 BASE) MCG/ACT inhaler Inhale 2 puffs into the lungs every 4 (four) hours as needed for wheezing or shortness of breath.    . ALPRAZolam (XANAX) 0.5 MG tablet Take 0.5 mg by mouth 3 (three) times daily.    Marland Kitchen aspirin 81 MG tablet Take 81 mg by mouth every morning.     . clopidogrel (PLAVIX) 75 MG tablet Take 1 tablet (75 mg total) by mouth daily. 30 tablet 11  . Fluticasone Furoate-Vilanterol 100-25 MCG/INH AEPB Inhale 1 puff into the lungs daily.    Marland Kitchen HYDROcodone-acetaminophen (NORCO) 10-325 MG per tablet Take 1 tablet by  mouth every 6 (six) hours as needed for moderate pain.    Marland Kitchen levothyroxine (SYNTHROID, LEVOTHROID) 125 MCG tablet Take 1 tablet (125 mcg total) by mouth daily before breakfast. 30 tablet 0  . metoprolol tartrate (LOPRESSOR) 25 MG tablet Take 0.5 tablets (12.5 mg total) by mouth 2 (two) times daily. 15 tablet 0  . nitroGLYCERIN (NITROSTAT) 0.4 MG SL tablet Place 1 tablet (0.4 mg total) under the tongue every 5 (five) minutes x 3 doses as needed for chest pain. 25 tablet 12  . pantoprazole (PROTONIX) 40 MG tablet Take 1 tablet (40 mg total) by mouth 2 (two) times daily. (Patient taking differently: Take 40 mg by mouth every morning. ) 60 tablet 3  . traZODone (DESYREL) 150 MG tablet Take 200 mg by mouth at bedtime. 200 mg at bedtime    . amLODipine (NORVASC) 5 MG tablet Take 5 mg by mouth daily.    . pravastatin (PRAVACHOL) 40 MG tablet Take 1 tablet (40 mg total) by mouth every evening. (Patient not taking: Reported on 07/26/2014) 30 tablet 3  . predniSONE (DELTASONE) 5 MG tablet Take 5 mg by mouth daily with breakfast.     No current facility-administered medications for this visit.    Functional Status:   In your present state of health, do you have any difficulty performing the following activities: 07/19/2014 04/07/2014  Hearing? Y N  Vision? N N  Difficulty concentrating or making decisions? Y N  Walking or climbing stairs?  Y N  Dressing or bathing? N N  Doing errands, shopping? Y N  Preparing Food and eating ? N -  Using the Toilet? N -  In the past six months, have you accidently leaked urine? N -  Do you have problems with loss of bowel control? N -  Managing your Medications? Y -  Managing your Finances? N -  Housekeeping or managing your Housekeeping? Y -    Fall/Depression Screening:    PHQ 2/9 Scores 07/19/2014  PHQ - 2 Score 0    Assessment:   Falls:   Bobby Floyd reports that he has fallen within the last two weeks, denies injury or dizziness at the time of the fall. He has  a cane that he does not use at all times. He reports most of his falls take place at home, in his bedroom area, noted scatter rugs in living room and kitchen area, and on opposite side of the bed that he gets up from.Reviewed fall risk precautions. Bobby Floyd lives in a second floor apartment across for the managers office " so they can watch out for me". He reports that he prefers staying on that level and does not want to move to a ground level apartment. Reinforced with the patient to notify MD if he develops dizziness or has a fall. I checked patient blood pressure sitting then standing during visit.Sitting: 110/80 heart rate 68, Standing BP 108/70, heart rate 68. Patient does not have a blood pressure machine at home says he goes to the Bobby Floyd at least once a week, to have blood pressure checked. Discussed with the patient that  I will  research the price or co-pay of home blood pressure machine.  Patient reports that he has stopped taking Norvasc and clonidine that was prescribed for him," I threw them in the trash". He reports to me that his last blood pressure at the doctors office was 130/97 "that's too high for me", " I started back taking Metoprolol 12.5 mg twice a day like I  was previously taking", states that he made Bobby Floyd aware of this. During medication review patient able to state appropriately why he was taking each medication. Discussed with the patient the importance of communication with MD prior to stopping and starting medication. He usually fills a 2 day pill box, supplied him with a 1 week pill box from [facility]. He reports that he only keeps a 1 week supply of his  norco in his apartment at a time, due to that medication being missing after his last hospitalization.  COPD- Patient reports that he was recently diagnosed with COPD, his first appointment with Pulmonologist is 08/26/14. Began education on use of inhalers that he has been recently prescribed.  Plan: Will forward initial  assessment notes and [facility] involvement letter  to Bobby Floyd. Patient has contact information for Care Management Coordinator and encouraged to call for concerns.  Routine home visit by RN, Care Coordinator on 08/23/14  10 am.  [place] Problem One        Patient Outreach from 07/26/2014 in [place] Problem One  Falls Frequently   Care Plan for Problem One  Active   [facility] Long Term Goal (31-90 days)  Patient will report decrease in falls in the next 60 days   [facility] Long Term Goal Start Date  07/26/14   Interventions for Problem One Long Term Goal  Fall prevention emmi video viewed  with patient, provided  and reviewed fall prevention emmi handouts . Reminded pt to discuss with MD episodes of dizziness,.  and to report all falls to MD. Do not drive when dizzy. Explained importance of taking medication as MD ordered.   [facility] CM Short Term Goal #1 (0-30 days)  Pt will report increased use of  his cane when walking in the next 30 days    [facility] CM Short Term Goal #1 Start Date  07/26/14   Interventions for Short Term Goal #1  Discussed with pt use of cane as assistive device when walking to help decrease falls, always wear supportive shoes when walking, and wearing nonslip socks or slippers indoor discussed , removal fo scatter rugs for safety, explained the importance of using cane for support when walking   [facility] CM Short Term Goal #2 (0-30 days)  Patient will report a structured walking program at least 3 days a week for the next 30 days   [facility] CM Short Term Goal #2 Start Date  07/26/14   Interventions for Short Term Goal #2  Discussed the importance of exercise and activity in gaining strengh, reminded to take his inhaler with him during walks [Always carry nitroglycerin spray ]    [facility] CM Care Plan Problem Two        Patient Outreach from 07/26/2014 in [place] Problem Two  Knowledge of [place] for Problem Two  Active   Interventions for  Problem Two Long Term Goal   reviewed COPD, emmi education handouts with the patient, [facility] COPD booklet   [facility]N Long Term Goal (31-90) days  Patient will report increased knowledge of COPD in the next 60 days   THN Long Term Goal Start Date  07/26/14   [facility] CM Short Term Goal #1 (0-30 days)  Patient will be able to report at least 3 symptoms in the  COPD yellow zone   [facility] CM Short Term Goal #1 Start Date  07/26/14   Interventions for Short Term Goal #2   Reviewed   COPD Zones in [facility] calendar symptoms,& actions to take and when to call the MD, Pt placed COPD Zone magnet on his refrigerator   [facility] CM Short Term Goal #2 (0-30 days)  Patient will be able to report the action to take if symptoms in the  yellow zone occur in the next 30 days   [facility] CM Short Term Goal #2 Start Date  07/26/14   Interventions for Short Term Goal #2  Discussed importance of  using recue inhaler for sob, and always keep it with him, reviewed how to use inhalers, encouraged to call MD if inhalers are not effective.     Joylene Draft, RN, [place] Care Management [phone]

## 2014-07-27 ENCOUNTER — Other Ambulatory Visit: Payer: Self-pay | Admitting: *Deleted

## 2014-07-27 ENCOUNTER — Encounter: Payer: Self-pay | Admitting: *Deleted

## 2014-07-27 NOTE — Patient Outreach (Signed)
[facility]) Care Management  07/27/2014  [name] 1961-01-01 [id number]   Incoming call from Mr. Bobby Floyd, stating that he had decided that it would be best for him to be on a ground level apartment since he had difficulty walking up stairs and due to the falls that he has had in the past. He stated that he had already spoken to his property manager, [name], but wanted me to speak with her as well, she requested that a recommendation letter from MD be placed in his file.I placed a call to Dr. Tobie Poet office to notify them of patient request and to give  contact information of apartment manager.  Joylene Draft, RN, [place] Care Management [phone]

## 2014-07-29 ENCOUNTER — Encounter: Payer: Self-pay | Admitting: Cardiology

## 2014-08-13 ENCOUNTER — Encounter: Payer: Self-pay | Admitting: *Deleted

## 2014-08-23 ENCOUNTER — Other Ambulatory Visit: Payer: Self-pay | Admitting: *Deleted

## 2014-08-23 NOTE — Patient Outreach (Signed)
[facility] [facility]) Care Management   08/23/2014  Bobby Floyd [birth year] [id number]  Bobby Floyd is an 54 y.o. male  Subjective: " I'm a little sleepy this morning'  Objective:   Review of Systems  Constitutional: Negative.   HENT: Negative.   Eyes: Negative.   Respiratory: Negative.   Cardiovascular: Negative.   Gastrointestinal: Negative.   Genitourinary: Negative.   Skin: Negative.   Neurological: Negative.   Endo/Heme/Allergies: Negative.   Psychiatric/Behavioral: Negative.     Physical Exam  Constitutional: Vital signs are normal.  States weight loss, appetite decreased due to pain in left jaw area  Cardiovascular: Normal rate and regular rhythm.   Respiratory: Breath sounds normal.  Neurological: He is alert.  Skin: Skin is warm, dry and intact.  Psychiatric: He has a normal mood and affect. His speech is normal and behavior is normal. Judgment and thought content normal. Cognition and memory are normal.    Current Medications:   Current Outpatient Prescriptions  Medication Sig Dispense Refill  . albuterol (PROVENTIL HFA;VENTOLIN HFA) 108 (90 BASE) MCG/ACT inhaler Inhale 2 puffs into the lungs every 4 (four) hours as needed for wheezing or shortness of breath.    . ALPRAZolam (XANAX) 0.5 MG tablet Take 1 mg by mouth 3 (three) times daily as needed.     Marland Kitchen aspirin 81 MG tablet Take 81 mg by mouth every morning.     . clopidogrel (PLAVIX) 75 MG tablet Take 1 tablet (75 mg total) by mouth daily. 30 tablet 11  . Fluticasone Furoate-Vilanterol 100-25 MCG/INH AEPB Inhale 1 puff into the lungs daily.    Marland Kitchen HYDROmorphone (DILAUDID) 2 MG tablet Take 2 mg by mouth every 4 (four) hours as needed for severe pain. Take  One tablet by mouth every 4 to 6 hours as needed for pain    . isosorbide mononitrate (IMDUR) 30 MG 24 hr tablet Take 30 mg by mouth daily.    Marland Kitchen levothyroxine (SYNTHROID, LEVOTHROID) 125 MCG tablet Take 1 tablet (125 mcg total) by mouth  daily before breakfast. 30 tablet 0  . metoprolol tartrate (LOPRESSOR) 25 MG tablet Take 0.5 tablets (12.5 mg total) by mouth 2 (two) times daily. 15 tablet 0  . nitroGLYCERIN (NITROSTAT) 0.4 MG SL tablet Place 1 tablet (0.4 mg total) under the tongue every 5 (five) minutes x 3 doses as needed for chest pain. 25 tablet 12  . pantoprazole (PROTONIX) 40 MG tablet Take 1 tablet (40 mg total) by mouth 2 (two) times daily. (Patient taking differently: Take 40 mg by mouth every morning. ) 60 tablet 3  . traZODone (DESYREL) 150 MG tablet Take 200 mg by mouth at bedtime. 200 mg at bedtime    . amLODipine (NORVASC) 5 MG tablet Take 5 mg by mouth daily.    Marland Kitchen HYDROcodone-acetaminophen (NORCO) 10-325 MG per tablet Take 1 tablet by mouth every 6 (six) hours as needed for moderate pain.    . pravastatin (PRAVACHOL) 40 MG tablet Take 1 tablet (40 mg total) by mouth every evening. (Patient not taking: Reported on 07/26/2014) 30 tablet 3  . predniSONE (DELTASONE) 5 MG tablet Take 5 mg by mouth daily with breakfast.     No current facility-administered medications for this visit.    Functional Status:   In your present state of health, do you have any difficulty performing the following activities: 07/19/2014 04/07/2014  Hearing? Y N  Vision? N N  Difficulty concentrating or making decisions? Y N  Walking or  climbing stairs? Y N  Dressing or bathing? N N  Doing errands, shopping? Y N  Preparing Food and eating ? N -  Using the Toilet? N -  In the past six months, have you accidently leaked urine? N -  Do you have problems with loss of bowel control? N -  Managing your Medications? Y -  Managing your Finances? N -  Housekeeping or managing your Housekeeping? Y -    Fall/Depression Screening:    PHQ 2/9 Scores 07/19/2014  PHQ - 2 Score 0    Assessment:  Arrived for scheduled home visit. Greeted by the patient at the door, he has moved to a ground level apartment. Patient states" I have some news about my  cancer treatment effects", he shows RN a printed information sheet with the diagnosis Osteoradionecrosis of the jaw bone area as  the reason for the increased pain in my left jaw states that it was related to the radiation therapy he received. Bobby Floyd states that now takes dilaudid as needed for pain, currently pain at a level of 2. Reviewed with patient to not drive while taking the pain medication, he voiced understanding and states that he has friends that are helping him with transportation if needed, or he doesn't take his pain medication before driving.  Bobby Floyd reports that's he has lost weight, greater than 10 pounds in last month, due to decreased appetite related to jaw pain, but that it has improved since he has the dilaudid to help control pain. We discussed several soft food options and eating 5 small meals per day instead of 3 meals. He drinks instant breakfast once a day, encouraged to add additional one in the afternoon.  Patient denies falls since last visit, states he is glad to be on the first floor of apartment so he does not have to climb up the stairs. Encouraged to continue to use his cane when he goes out. Bobby Floyd states that he has been working on stop smoking and that he is down to 3 - 4 cigarettes a day and that he goes on his patio when he smokes. Denies further needs or concerns at this time, encouraged to call for further needs or concerns.  Plan:  Scheduled a RNCM home visit on July 5 at [place], [place], [place] Care Management [phone]

## 2014-08-26 ENCOUNTER — Institutional Professional Consult (permissible substitution): Payer: Self-pay | Admitting: Pulmonary Disease

## 2014-09-14 ENCOUNTER — Ambulatory Visit: Payer: Self-pay | Admitting: *Deleted

## 2014-09-14 ENCOUNTER — Other Ambulatory Visit: Payer: Self-pay | Admitting: *Deleted

## 2014-09-14 NOTE — Patient Outreach (Signed)
[facility]) Care Management  09/14/2014  Bobby Floyd 02/18/1961 [id number]   Arrived at patient home for scheduled home visit, no answer at door, called patient by phone he stated that he was at the Dr. Gabriel Carina, that they just called him on Friday, with the appointment for today with a pain specialist. Rescheduled home visit for Friday, July 22 at 12:30.Joylene Draft, RN, [place] Care Management [phone]

## 2014-10-01 ENCOUNTER — Other Ambulatory Visit: Payer: Self-pay | Admitting: *Deleted

## 2014-10-01 NOTE — Patient Outreach (Signed)
[place] [facility]) Care Management   10/01/2014  DEMARRI ELIE [birth year] [id number]  CALEL PISARSKI is an 54 y.o. male  Subjective: Mr. Zambito reports that he is doing better since not taking dilaudid, states he is taking Norco for pain and doing better with that. He reports that he and  his primary care doctor do not get along and that he is looking into getting a new doctor in [place], Dr.Whyte. Mr. Kunkle discussed that his doctor wanted to take him off Xanax and to put him on prozac he disagrees with that because xanax is working for him. Mr. Mendonca reports 1 fall since last visit, it happened while he was getting up from his rocking recliner chair getting ready to go to bed, denies dizziness.  Objective:   Review of Systems  Constitutional: Negative.   Respiratory: Positive for cough.        Non productive  Cardiovascular: Negative.   Gastrointestinal: Negative.   Genitourinary: Negative.   Musculoskeletal: Positive for falls and neck pain.       Chronic neck pain  Skin: Negative.   Neurological: Negative.   Endo/Heme/Allergies: Negative.   Psychiatric/Behavioral: Positive for depression. Negative for suicidal ideas. The patient is nervous/anxious.     Physical Exam  Constitutional: He is oriented to person, place, and time. He appears well-developed.  Cardiovascular: Normal rate and regular rhythm.   Respiratory: Effort normal.  Occasional nonproductive cough  Neurological: He is alert and oriented to person, place, and time.  Skin: Skin is warm and dry.     Small bruise left lower inner leg  Psychiatric: His mood appears anxious.   BP 110/60 mmHg  Pulse 60  Resp 22  SpO2 96% Current Medications:   Current Outpatient Prescriptions  Medication Sig Dispense Refill  . albuterol (PROVENTIL HFA;VENTOLIN HFA) 108 (90 BASE) MCG/ACT inhaler Inhale 2 puffs into the lungs every 4 (four) hours as needed for wheezing or shortness of breath.    .  ALPRAZolam (XANAX) 0.5 MG tablet Take 1 mg by mouth 3 (three) times daily as needed.     Marland Kitchen amLODipine (NORVASC) 5 MG tablet Take 5 mg by mouth daily.    Marland Kitchen aspirin 81 MG tablet Take 81 mg by mouth every morning.     . clopidogrel (PLAVIX) 75 MG tablet Take 1 tablet (75 mg total) by mouth daily. 30 tablet 11  . Fluticasone Furoate-Vilanterol 100-25 MCG/INH AEPB Inhale 1 puff into the lungs daily.    Marland Kitchen HYDROcodone-acetaminophen (NORCO) 10-325 MG per tablet Take 1 tablet by mouth every 6 (six) hours as needed for moderate pain.    . isosorbide mononitrate (IMDUR) 30 MG 24 hr tablet Take 30 mg by mouth daily.    Marland Kitchen levothyroxine (SYNTHROID, LEVOTHROID) 125 MCG tablet Take 1 tablet (125 mcg total) by mouth daily before breakfast. 30 tablet 0  . metoprolol tartrate (LOPRESSOR) 25 MG tablet Take 0.5 tablets (12.5 mg total) by mouth 2 (two) times daily. 15 tablet 0  . nitroGLYCERIN (NITROSTAT) 0.4 MG SL tablet Place 1 tablet (0.4 mg total) under the tongue every 5 (five) minutes x 3 doses as needed for chest pain. 25 tablet 12  . pantoprazole (PROTONIX) 40 MG tablet Take 1 tablet (40 mg total) by mouth 2 (two) times daily. (Patient taking differently: Take 40 mg by mouth every morning. ) 60 tablet 3  . traZODone (DESYREL) 150 MG tablet Take 200 mg by mouth at bedtime. 200 mg at bedtime    .  HYDROmorphone (DILAUDID) 2 MG tablet Take 2 mg by mouth every 4 (four) hours as needed for severe pain. Take  One tablet by mouth every 4 to 6 hours as needed for pain    . pravastatin (PRAVACHOL) 40 MG tablet Take 1 tablet (40 mg total) by mouth every evening. (Patient not taking: Reported on 07/26/2014) 30 tablet 3  . predniSONE (DELTASONE) 5 MG tablet Take 5 mg by mouth daily with breakfast.     No current facility-administered medications for this visit.    Functional Status:   In your present state of health, do you have any difficulty performing the following activities: 07/19/2014 04/07/2014  Hearing? Y N  Vision?  N N  Difficulty concentrating or making decisions? Y N  Walking or climbing stairs? Y N  Dressing or bathing? N N  Doing errands, shopping? Y N  Preparing Food and eating ? N -  Using the Toilet? N -  In the past six months, have you accidently leaked urine? N -  Do you have problems with loss of bowel control? N -  Managing your Medications? Y -  Managing your Finances? N -  Housekeeping or managing your Housekeeping? Y -    Fall/Depression Screening:    PHQ 2/9 Scores 10/01/2014 07/19/2014  PHQ - 2 Score 0 0    Assessment:  Mr. Baxendale is really anxious today, eager to make contact with a new Primary Doctor that he has chosen and has called and made appointment with Dr.Whyte, [facility] and called [employer] to get MD name on his insurance card changed. He is adamant regarding staying on his xanax and norco that has helped his anxiety,and states he does not need prozac.Mr. Advani states he has seen a counselor in the past related to his PTSD and depression, but states that he can not afford to do that now. Denies suicidal thoughts. Declined assistance with seeking counseling at this time.   Plan: Will follow up with Mr. Ouida Sills on Tuesday, July, 26. Will discuss visit with Dr. Tobie Poet office on Monday 7/26.  [facility] CM Care Plan Problem One        Patient Outreach from 10/01/2014 in [place] Problem One  Falls Frequently   Care Plan for Problem One  Active   [facility]N Long Term Goal (31-90 days)  Patient will report decrease in falls in the next 60 days   THN Long Term Goal Start Date  10/01/14   Interventions for Problem One Long Term Goal  encouraged to wear slip resistant shoes at home, discussed sitting in a chair that doesn't rock when he gets up.   [facility]N CM Short Term Goal #1 (0-30 days)  Pt will report increased use of  his cane when walking in the next 30 days    THN CM Short Term Goal #1 Start Date  07/26/14   [facility] CM Short Term Goal #1 Met  Date  10/01/14   THN CM Short Term Goal #2 (0-30 days)  Patient will report a structured walking program at least 3 days a week for the next 30 days   THN CM Short Term Goal #2 Start Date  10/01/14   Interventions for Short Term Goal #2  Discussed importance of starting to walk in home in supportive shoes     [facility] CM Care Plan Problem Two        Patient Outreach from 10/01/2014 in [place] Problem  Two  Knowledge of COPD   Care Plan for Problem Two  Not Active   Interventions for Problem Two Long Term Goal   -- [patient reports that specialist told him he didn't have COPD]   THN Long Term Goal (31-90) days  Patient will report increased knowledge of COPD in the next 60 days   THN Long Term Goal Start Date  07/26/14   THN Long Term Goal Met Date  08/23/14   THN CM Short Term Goal #1 (0-30 days)  Patient will be able to report at least 3 symptoms in the  COPD yellow zone   [facility] CM Short Term Goal #1 Start Date  07/26/14   [facility] CM Short Term Goal #1 Met Date   08/23/14   THN CM Short Term Goal #2 (0-30 days)  Patient will be able to report the action to take if symptoms in the  yellow zone occur in the next 30 days   THN CM Short Term Goal #2 Start Date  07/26/14   [facility] CM Short Term Goal #2 Met Date  08/23/14         Joylene Draft, [place], [place] Care Management [phone]

## 2014-10-04 ENCOUNTER — Other Ambulatory Visit: Payer: Self-pay | Admitting: *Deleted

## 2014-10-04 NOTE — Patient Outreach (Signed)
[place] [facility]) Care Management  10/04/2014  EEAN BUSS 1960-06-12 [id number]  Telephone call to Dr.Cox, spoke with Collie Siad in the office informed of my [place] Management RN home visit to patient on Friday, July 22. Informed of patient being upset and is pursuing finding  another primary care physician and wanting to stay on his same medications, xanax,metoprolol,norco and not to make changes. Home visit note has been sent to Dr. Tobie Poet office.  Joylene Draft, RN, [place] Care Management [phone]

## 2014-10-05 ENCOUNTER — Other Ambulatory Visit: Payer: Self-pay | Admitting: *Deleted

## 2014-10-05 NOTE — Patient Outreach (Signed)
[facility]) Care Management  10/05/2014  Bobby Floyd 1960/05/05 [id number]   Scheduled telephone outreach  Subjective: [name] reports that he is in better spirits, discussed recent office appointment with his cardiologist states he  was pleased with his blood pressure.Denies complaints of discomfort, no dizziness.  [name] states that he is still pursuing getting in to see a different primary care provider, reports that he is calling [employer] to get the doctors name on his insurance card changed.  Plan: Will follow up with patient in 2 weeks by phone to discuss his progress. August 9 at [place], [place], [place] Care Management [phone]

## 2014-10-11 ENCOUNTER — Other Ambulatory Visit: Payer: Self-pay | Admitting: *Deleted

## 2014-10-19 ENCOUNTER — Other Ambulatory Visit: Payer: Self-pay | Admitting: *Deleted

## 2014-10-19 NOTE — Patient Outreach (Signed)
[facility] [facility]) Care Management  10/19/2014  Bobby Floyd 28-Feb-1961 [id number]  Telephone outreach  Subjective: Bobby Floyd reports that he is  doing better today, he discussed a recent visit to his oncologist in [place], reports that his blood pressure was good,, and that his pain is controlled with Norco as needed. He denies having a fall this month. Bobby Floyd reports that he will see his new Primary Care MD, Dr. Salvadore Oxford on August 10.  Plan: Schedule home visit for August 22 at [address], [place], [place] Care Management [phone]

## 2014-11-01 ENCOUNTER — Other Ambulatory Visit: Payer: Self-pay | Admitting: *Deleted

## 2014-11-01 VITALS — BP 128/80 | HR 85 | Resp 20

## 2014-11-01 DIAGNOSIS — Z9181 History of falling: Secondary | ICD-10-CM

## 2014-11-01 DIAGNOSIS — Z859 Personal history of malignant neoplasm, unspecified: Secondary | ICD-10-CM

## 2014-11-01 NOTE — Patient Outreach (Signed)
[facility]) Care Management   11/01/2014  Bobby Floyd 1961-01-29 [id number]  Bobby Floyd is an 54 y.o. male   Routine Home Visit  Subjective: Bobby Floyd discussed that he had an recent appointment at his new PCP and that " I really like him", but he thinks that I should be seeing a psychiatrist for my anxiety and PTSD, patient discussed the cost of seeing a psychiatrist is $45 "I can't afford that", my xanax and prayer keeps me calm. Bobby Floyd discussed further his psychiatric history , states that he can't do group therapy, I can talk to one person better.  Bobby Floyd discussed concern regarding his blood and being able to keep a better check on it. He also states that Dr.Whyte was concerned about his weight, patient replied "I'm eating all I can, and my weight is much better than it has been.   Objective:   Review of Systems  Constitutional: Negative.   HENT: Negative.   Eyes: Negative.   Respiratory: Negative.   Cardiovascular: Negative.   Gastrointestinal: Negative.   Musculoskeletal: Negative.   Skin: Negative.   Neurological:       Complaint of weakness in left arm and leg related to neuropathy  Psychiatric/Behavioral: Positive for depression. The patient is nervous/anxious.     Physical Exam  Constitutional: He is oriented to person, place, and time. He appears well-developed.  Cardiovascular: Normal rate.   Respiratory: Effort normal.  GI: Soft.  Musculoskeletal: Normal range of motion.  Neurological: He is oriented to person, place, and time.  Skin: Skin is warm and dry.  Psychiatric:  anxious   BP 128/80 mmHg  Pulse 85  Resp 20  SpO2 98% Current Medications:   Current Outpatient Prescriptions  Medication Sig Dispense Refill  . albuterol (PROVENTIL HFA;VENTOLIN HFA) 108 (90 BASE) MCG/ACT inhaler Inhale 2 puffs into the lungs every 4 (four) hours as needed for wheezing or shortness of breath.    . ALPRAZolam (XANAX) 0.5 MG  tablet Take 1 mg by mouth 3 (three) times daily as needed.     Marland Kitchen aspirin 81 MG tablet Take 81 mg by mouth every morning.     . clopidogrel (PLAVIX) 75 MG tablet Take 1 tablet (75 mg total) by mouth daily. 30 tablet 11  . HYDROcodone-acetaminophen (NORCO) 10-325 MG per tablet Take 1 tablet by mouth every 6 (six) hours as needed for moderate pain.    Marland Kitchen levothyroxine (SYNTHROID, LEVOTHROID) 125 MCG tablet Take 1 tablet (125 mcg total) by mouth daily before breakfast. 30 tablet 0  . metoprolol tartrate (LOPRESSOR) 25 MG tablet Take 0.5 tablets (12.5 mg total) by mouth 2 (two) times daily. 15 tablet 0  . nitroGLYCERIN (NITROSTAT) 0.4 MG SL tablet Place 1 tablet (0.4 mg total) under the tongue every 5 (five) minutes x 3 doses as needed for chest pain. 25 tablet 12  . pantoprazole (PROTONIX) 40 MG tablet Take 1 tablet (40 mg total) by mouth 2 (two) times daily. (Patient taking differently: Take 40 mg by mouth every morning. ) 60 tablet 3  . traZODone (DESYREL) 150 MG tablet Take 200 mg by mouth at bedtime. 200 mg at bedtime    . amLODipine (NORVASC) 5 MG tablet Take 5 mg by mouth daily.    . Fluticasone Furoate-Vilanterol 100-25 MCG/INH AEPB Inhale 1 puff into the lungs daily.    Marland Kitchen HYDROmorphone (DILAUDID) 2 MG tablet Take 2 mg by mouth every 4 (four) hours as needed for severe pain. Take  One  tablet by mouth every 4 to 6 hours as needed for pain    . isosorbide mononitrate (IMDUR) 30 MG 24 hr tablet Take 30 mg by mouth daily.    . pravastatin (PRAVACHOL) 40 MG tablet Take 1 tablet (40 mg total) by mouth every evening. (Patient not taking: Reported on 07/26/2014) 30 tablet 3  . predniSONE (DELTASONE) 5 MG tablet Take 5 mg by mouth daily with breakfast.     No current facility-administered medications for this visit.    Functional Status:   In your present state of health, do you have any difficulty performing the following activities: 11/01/2014 11/01/2014  Hearing? [place]? N -  Difficulty  concentrating or making decisions? Y -  Walking or climbing stairs? Y -  Dressing or bathing? N -  Doing errands, shopping? N -  Preparing Food and eating ? - Y  Using the Toilet? - N  In the past six months, have you accidently leaked urine? - N  Do you have problems with loss of bowel control? - N  Managing your Medications? - N  Managing your Finances? - N  Housekeeping or managing your Housekeeping? - Y    Fall/Depression Screening:    PHQ 2/9 Scores 10/01/2014 10/01/2014 07/19/2014  PHQ - 2 Score 2 0 0  PHQ- 9 Score 6 - -    Assessment:   Falls : Bobby Floyd denies having a recent fall, states that he did get a little tangled up with his left foot, due to the neuropathy that he has in it, but he did not fall. Bobby Floyd states that he uses his cane more, but he did not use during the visit in the home, states he takes it with him when he goes out.  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  Blood Pressure Monitoring;  Bobby Floyd reports taking all of his medications as ordered, he uses a daily pill box and is able to state what each medication is used for.  Denies episodes of dizziness or feeling as if his blood pressure is low. Will educate patient on how to use blood pressure machine provided and when to notify MD.  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 Bobby Floyd states that he gets free ensure from the cancer center as he needs it , states that he is out of it at present but will go by there today to get more. He states that he drinks, instant breakfast every day, along with 2 ensures and other soft foods. He stated that he did not want scales to weigh on daily.  Plan: Will place a social worker consult to  assist with counseling options in the [place] follow up visit in 2 weeks to ensure proper use of blood pressure machine. Will send Dr.Whyte today's home visit note and send a letter of [facility] involvement.   [facility] CM Care Plan Problem One        Patient Outreach from 11/01/2014 in [place] Problem One  Falls Frequently   Care Plan for Problem One  Active   [facility]N Long Term Goal (31-90 days)  Patient will report decrease in falls in  the next 60 days   [facility]N Long Term Goal Start Date  10/01/14   Interventions for Problem One Long Term Goal  encouraged to wear slip resistant shoes at home, discussed sitting in a chair that doesn't rock when he gets up.   [facility] CM Short Term Goal #1 (0-30 days)  Pt will report increased use of  his cane when walking in the next 30 days    [facility] CM Short Term Goal #1 Start Date  07/26/14   [facility] CM Short Term Goal #1 Met Date  10/01/14   [facility] CM Short Term Goal #2 (0-30 days)  Patient will report a structured walking program at least 3 days a week for the next 30 days   [facility] CM Short Term Goal #2 Start Date  10/01/14   [facility] CM Short Term Goal #2 Met Date  11/01/14    [facility] CM Care Plan Problem Two        Patient Outreach from 11/01/2014 in [place] Problem Two  Blood pressure Monitor Education   Care Plan for Problem Two  Active   Interventions for Problem Two Long Term Goal   Review instruction for use of machine, demonstrate how to use monitor,    THN Long Term Goal (31-90) days  Patient will demonstrate proper use of Blood Pressure Monitor in the next 31 days   THN Long Term Goal Start Date  11/01/14   [facility] CM Short Term Goal #1 (0-30 days)  Patient will be able to return demonstrate proper use of blood pressure machine   [facility] CM Short Term Goal #1 Start Date  11/01/14   Interventions for Short Term Goal #2   Allow patient to return demonstrate how to use blood pressure machine   [facility]N CM Short Term Goal #2  (0-30 days)  Patient will check blood pressure at  least 3 days a week   [facility] CM Short Term Goal #2 Start Date  11/01/14   Interventions for Short Term Goal #2  Discuss with patient how to record in [facility] book,notify MD of blood pressures and symptoms outside your normal    [facility] CM Care Plan Problem Three        Patient Outreach from 11/01/2014 in [place] Problem Three  Weight Losss related to Nutrition concerns   Care Plan for Problem Three  Active   THN Long Term Goal (31-90) days  Patient will be able to report eating a balanced diet    THN Long Term Goal Start Date  11/01/14   Interventions for Problem Three Long Term Goal  Discuss the importance of eating a well balanced diet, proteins rich   [facility] CM Short Term Goal #1 (0-30 days)  Patient will be able to choose soft foods that he is able to tolerate for  for a well balanced diet   [facility] CM Short Term Goal #1 Start Date  11/01/14   Interventions for Short Term Goal #1  Discuss the current foods that he eats, discuss his likes and what he can tolerate, discuss foods as well as supplements    Joylene Draft, [place], [place] Management [phone]

## 2014-11-02 ENCOUNTER — Encounter: Payer: Self-pay | Admitting: *Deleted

## 2014-11-04 NOTE — Consult Note (Signed)
Bobby Floyd was referred to [facility] LCSW on 11/04/2014

## 2014-11-09 ENCOUNTER — Other Ambulatory Visit: Payer: Self-pay | Admitting: *Deleted

## 2014-11-09 NOTE — Patient Outreach (Signed)
[place] [facility]) Care Management  11/09/2014  Bobby Floyd 1960/03/15 [id number]  Patient referred to this social worker to provide resources for low cost mental heath services in patient's area.  Referral to [place] offered, however patient declined this referral stating that he had participated in their program in the past and they only have group therapy.  Per patient, he is more interested in individual therapy.    Per patient, he has also been to see a psychiatrist in the past, however has not been able to afford the co-pay.  Her is currently taking Xanax for anxiety and feels that it is helping to keep his tension down.  Patient states being actively involved in church and talks to his Doristine Bosworth often when feeling depressed or increasingly anxious. Patient reports a history of several behavioral health inpatient admissions, however currently denies any thoughts of suicide or homicide ideations.  [employer]'s [facility] discussed.  Patient agreeable to a referral to this program.   Plan:  This Education officer, museum will refer patient to [facility] [facility] health-[facility] program.  [facility] CM Care Plan Problem One        Most Recent Value   Care Plan Problem One  mental health referrals   Role Documenting the Problem One  Clinical Social Worker   Care Plan for Problem One  Active   [facility] CM Short Term Goal #1 (0-30 days)  patient will verbalize participation  with the [facility] program through [facility] within the next 30 days   [facility] CM Short Term Goal #1 Start Date  11/09/14   Interventions for Short Term Goal #1  LCSW referred patiet to [facility] health's [facility] program     [place], [place] Management [place], [place] Management [phone]

## 2014-11-12 ENCOUNTER — Other Ambulatory Visit: Payer: Self-pay | Admitting: *Deleted

## 2014-11-12 NOTE — Patient Outreach (Signed)
[facility]) Care Management  11/12/2014  Bobby Floyd [birth year] [id number]   Referral made on patient's behalf to [facility] due to patient's history of PTSD, depression and anxiety.  Per Judeen Hammans, a clinician will reach out to patient within 3-5 business days and will confirm contact with this Education officer, museum.   Sheralyn Boatman [facility] Care Management [phone]

## 2014-11-19 ENCOUNTER — Other Ambulatory Visit: Payer: Self-pay | Admitting: *Deleted

## 2014-11-19 ENCOUNTER — Encounter: Payer: Self-pay | Admitting: *Deleted

## 2014-11-19 NOTE — Patient Outreach (Signed)
[place] [facility]) Care Management   11/19/2014  Bobby Floyd 1960-10-04 [id number]  Bobby Floyd is an 54 y.o. male  Subjective: Patient reports feeling pretty good on this morning. Bobby Floyd discussed a fall that he had recently while walking at a nature trail with friend, states "I just got all scratched up in the thicket, I didn't pass out I just, just got my steps mixed up due to the neuropathy in my left foot". Bobby Floyd reports taking his medication as prescribed, denies chest pain or discomfort and states that he keeps his nitroglycerin spray with him.  Objective:   Review of Systems  Constitutional: Negative.   HENT: Negative.   Eyes: Negative.   Respiratory: Negative.   Cardiovascular: Negative for chest pain and palpitations.  Gastrointestinal: Negative.   Musculoskeletal: Positive for falls.  Skin: Negative.   Neurological: Negative.   Psychiatric/Behavioral: Negative for depression and suicidal ideas.    Physical Exam  Constitutional: He is oriented to person, place, and time. He appears well-developed.  Cardiovascular: Normal rate and normal heart sounds.   Respiratory: Effort normal and breath sounds normal.  GI: Soft.  Musculoskeletal: He exhibits no edema.  Neurological: He is alert and oriented to person, place, and time.  Skin: Skin is warm and dry.  Bilateral scratches to bilateral lower legs,Bilateral arms with scratches, and ecchymotic areas, skin intact   BP 122/78 mmHg  Pulse 78  Resp 20  SpO2 98% Current Medications:   Current Outpatient Prescriptions  Medication Sig Dispense Refill  . albuterol (PROVENTIL HFA;VENTOLIN HFA) 108 (90 BASE) MCG/ACT inhaler Inhale 2 puffs into the lungs every 4 (four) hours as needed for wheezing or shortness of breath.    . ALPRAZolam (XANAX) 0.5 MG tablet Take 1 mg by mouth 3 (three) times daily as needed.     Marland Kitchen aspirin 81 MG tablet Take 81 mg by mouth every morning.     . clopidogrel  (PLAVIX) 75 MG tablet Take 1 tablet (75 mg total) by mouth daily. 30 tablet 11  . HYDROcodone-acetaminophen (NORCO) 10-325 MG per tablet Take 1 tablet by mouth every 6 (six) hours as needed for moderate pain.    Marland Kitchen levothyroxine (SYNTHROID, LEVOTHROID) 125 MCG tablet Take 1 tablet (125 mcg total) by mouth daily before breakfast. 30 tablet 0  . metoprolol tartrate (LOPRESSOR) 25 MG tablet Take 0.5 tablets (12.5 mg total) by mouth 2 (two) times daily. 15 tablet 0  . nitroGLYCERIN (NITROSTAT) 0.4 MG SL tablet Place 1 tablet (0.4 mg total) under the tongue every 5 (five) minutes x 3 doses as needed for chest pain. 25 tablet 12  . pantoprazole (PROTONIX) 40 MG tablet Take 1 tablet (40 mg total) by mouth 2 (two) times daily. (Patient taking differently: Take 40 mg by mouth every morning. ) 60 tablet 3  . traZODone (DESYREL) 150 MG tablet Take 200 mg by mouth at bedtime. 200 mg at bedtime    . amLODipine (NORVASC) 5 MG tablet Take 5 mg by mouth daily.    . Fluticasone Furoate-Vilanterol 100-25 MCG/INH AEPB Inhale 1 puff into the lungs daily.    Marland Kitchen HYDROmorphone (DILAUDID) 2 MG tablet Take 2 mg by mouth every 4 (four) hours as needed for severe pain. Take  One tablet by mouth every 4 to 6 hours as needed for pain    . isosorbide mononitrate (IMDUR) 30 MG 24 hr tablet Take 30 mg by mouth daily.    . pravastatin (PRAVACHOL) 40 MG tablet Take 1  tablet (40 mg total) by mouth every evening. (Patient not taking: Reported on 07/26/2014) 30 tablet 3  . predniSONE (DELTASONE) 5 MG tablet Take 5 mg by mouth daily with breakfast.     No current facility-administered medications for this visit.    Functional Status:   In your present state of health, do you have any difficulty performing the following activities: 11/01/2014 11/01/2014  Hearing? [place]? N -  Difficulty concentrating or making decisions? Y -  Walking or climbing stairs? Y -  Dressing or bathing? N -  Doing errands, shopping? N -  Preparing Food  and eating ? - Y  Using the Toilet? - N  In the past six months, have you accidently leaked urine? - N  Do you have problems with loss of bowel control? - N  Managing your Medications? - N  Managing your Finances? - N  Housekeeping or managing your Housekeeping? - Y    Fall/Depression Screening:    PHQ 2/9 Scores 11/19/2014 10/01/2014 10/01/2014 07/19/2014  PHQ - 2 Score 2 2 0 0  PHQ- 9 Score 8 6 - -    Assessment:   Fall Risk-  Patient reports one recent fall, overall decrease in falls  Blood Pressure - Patient continues to monitor his blood pressure it remains stable    Plan:  Scheduled follow up home visit in 1 month, October 7 at 1000.  [facility] CM Care Plan Problem One        Most Recent Value   Care Plan Problem One  Falls Frequently   Role Documenting the Problem One  Care Management [place] for Problem One  Active   [facility]N Long Term Goal (31-90 days)  Patient will report decrease in falls in the next 60 days   [facility]N Long Term Goal Start Date  10/01/14   Interventions for Problem One Long Term Goal  Discussed with patient importance of walking on level surfaces,   [facility] CM Short Term Goal #1 (0-30 days)  Pt will report increased use of  his cane when walking in the next 30 days    [facility] CM Short Term Goal #1 Start Date  07/26/14   [facility] CM Short Term Goal #1 Met Date  10/01/14   [facility] CM Short Term Goal #2 (0-30 days)  Patient will report a structured walking program at least 3 days a week for the next 30 days   [facility] CM Short Term Goal #2 Start Date  10/01/14   [facility] CM Short Term Goal #2 Met Date  11/01/14    [facility] CM Care Plan Problem Two        Most Recent Value   Care Plan Problem Two  Blood pressure Monitor Education   Role Documenting the Problem Two  Care Management Coordinator   Care Plan for Problem Two  Active   THN Long Term Goal (31-90) days  Patient will demonstrate proper use of Blood Pressure Monitor in the next 31 days   THN Long Term Goal Start Date  11/01/14    [facility]N Long Term Goal Met Date  11/19/14   [facility] CM Short Term Goal #1 (0-30 days)  Patient will be able to return demonstrate proper use of blood pressure machine   [facility] CM Short Term Goal #1 Start Date  11/01/14   [facility] CM Short Term Goal #1 Met Date   11/19/14   [facility] CM Short Term Goal #2 (0-30 days)  Patient will check  blood pressure at  least 3 days a week   [facility]N CM Short Term Goal #2 Start Date  11/01/14   [facility] CM Short Term Goal #2 Met Date  11/19/14    [facility] CM Care Plan Problem Three        Most Recent Value   Care Plan Problem Three  Weight Losss related to Nutrition concerns   Role Documenting the Problem Three  Care Management Coordinator   Care Plan for Problem Three  Active   THN Long Term Goal (31-90) days  Patient will be able to report eating a balanced diet    THN Long Term Goal Start Date  11/01/14   Interventions for Problem Three Long Term Goal  reviewed labels for sodium content, protein and nutrients, discussed his usual diet , encouraged to try and eat small frequent meals.    [facility] CM Short Term Goal #1 (0-30 days)  Patient will be able to choose soft foods that he is able to tolerate for  for a well balanced diet   [facility] CM Short Term Goal #1 Start Date  11/01/14   [facility] CM Short Term Goal #1 Met Date  11/19/14     Joylene Draft, [place], [place] Care Management [phone]

## 2014-11-24 ENCOUNTER — Other Ambulatory Visit: Payer: Self-pay | Admitting: *Deleted

## 2014-11-24 NOTE — Patient Outreach (Signed)
[place] [facility]) Care Management  11/24/2014  VIRGEL HARO Sep 11, 1960 [id number]   Phone call to [facility] to follow up on referral.  Behavioral health referral received, they have reached out to patient one time and have left a voicemail message.  They will reach out to patient 2 additional times.  This Education officer, museum will encourage patient to return the call.   Sheralyn Boatman [facility] Care Management [phone]

## 2014-11-29 ENCOUNTER — Encounter (HOSPITAL_COMMUNITY): Payer: Self-pay | Admitting: Radiology

## 2014-11-29 ENCOUNTER — Inpatient Hospital Stay (HOSPITAL_COMMUNITY)
Admission: EM | Admit: 2014-11-29 | Discharge: 2014-12-01 | DRG: 065 | Disposition: A | Discharge status: Home / Self Care | Payer: Medicare HMO | Attending: Internal Medicine | Admitting: Internal Medicine

## 2014-11-29 ENCOUNTER — Emergency Department (HOSPITAL_COMMUNITY): Payer: Medicare HMO

## 2014-11-29 DIAGNOSIS — Z95828 Presence of other vascular implants and grafts: Secondary | ICD-10-CM | POA: Diagnosis not present

## 2014-11-29 DIAGNOSIS — I480 Paroxysmal atrial fibrillation: Secondary | ICD-10-CM | POA: Diagnosis present

## 2014-11-29 DIAGNOSIS — Z85818 Personal history of malignant neoplasm of other sites of lip, oral cavity, and pharynx: Secondary | ICD-10-CM

## 2014-11-29 DIAGNOSIS — Z66 Do not resuscitate: Secondary | ICD-10-CM | POA: Diagnosis not present

## 2014-11-29 DIAGNOSIS — Z79899 Other long term (current) drug therapy: Secondary | ICD-10-CM

## 2014-11-29 DIAGNOSIS — I252 Old myocardial infarction: Secondary | ICD-10-CM | POA: Diagnosis not present

## 2014-11-29 DIAGNOSIS — I160 Hypertensive urgency: Secondary | ICD-10-CM | POA: Diagnosis present

## 2014-11-29 DIAGNOSIS — R079 Chest pain, unspecified: Secondary | ICD-10-CM | POA: Diagnosis not present

## 2014-11-29 DIAGNOSIS — Z885 Allergy status to narcotic agent status: Secondary | ICD-10-CM | POA: Diagnosis not present

## 2014-11-29 DIAGNOSIS — K219 Gastro-esophageal reflux disease without esophagitis: Secondary | ICD-10-CM | POA: Diagnosis present

## 2014-11-29 DIAGNOSIS — Z955 Presence of coronary angioplasty implant and graft: Secondary | ICD-10-CM

## 2014-11-29 DIAGNOSIS — Z7982 Long term (current) use of aspirin: Secondary | ICD-10-CM | POA: Diagnosis not present

## 2014-11-29 DIAGNOSIS — F419 Anxiety disorder, unspecified: Secondary | ICD-10-CM | POA: Diagnosis present

## 2014-11-29 DIAGNOSIS — Z888 Allergy status to other drugs, medicaments and biological substances status: Secondary | ICD-10-CM

## 2014-11-29 DIAGNOSIS — M6289 Other specified disorders of muscle: Secondary | ICD-10-CM

## 2014-11-29 DIAGNOSIS — F17210 Nicotine dependence, cigarettes, uncomplicated: Secondary | ICD-10-CM | POA: Diagnosis present

## 2014-11-29 DIAGNOSIS — I200 Unstable angina: Secondary | ICD-10-CM | POA: Diagnosis not present

## 2014-11-29 DIAGNOSIS — I639 Cerebral infarction, unspecified: Principal | ICD-10-CM | POA: Diagnosis present

## 2014-11-29 DIAGNOSIS — R29810 Facial weakness: Secondary | ICD-10-CM | POA: Diagnosis present

## 2014-11-29 DIAGNOSIS — G8929 Other chronic pain: Secondary | ICD-10-CM | POA: Diagnosis present

## 2014-11-29 DIAGNOSIS — R531 Weakness: Secondary | ICD-10-CM | POA: Diagnosis not present

## 2014-11-29 DIAGNOSIS — E039 Hypothyroidism, unspecified: Secondary | ICD-10-CM | POA: Diagnosis present

## 2014-11-29 DIAGNOSIS — I10 Essential (primary) hypertension: Secondary | ICD-10-CM | POA: Diagnosis present

## 2014-11-29 DIAGNOSIS — Z7902 Long term (current) use of antithrombotics/antiplatelets: Secondary | ICD-10-CM | POA: Diagnosis not present

## 2014-11-29 DIAGNOSIS — W19XXXA Unspecified fall, initial encounter: Secondary | ICD-10-CM | POA: Diagnosis not present

## 2014-11-29 DIAGNOSIS — G459 Transient cerebral ischemic attack, unspecified: Secondary | ICD-10-CM | POA: Diagnosis not present

## 2014-11-29 DIAGNOSIS — E785 Hyperlipidemia, unspecified: Secondary | ICD-10-CM | POA: Diagnosis not present

## 2014-11-29 DIAGNOSIS — Z23 Encounter for immunization: Secondary | ICD-10-CM

## 2014-11-29 DIAGNOSIS — C099 Malignant neoplasm of tonsil, unspecified: Secondary | ICD-10-CM | POA: Diagnosis present

## 2014-11-29 DIAGNOSIS — I25110 Atherosclerotic heart disease of native coronary artery with unstable angina pectoris: Secondary | ICD-10-CM | POA: Diagnosis present

## 2014-11-29 DIAGNOSIS — R4182 Altered mental status, unspecified: Secondary | ICD-10-CM | POA: Insufficient documentation

## 2014-11-29 DIAGNOSIS — I6789 Other cerebrovascular disease: Secondary | ICD-10-CM | POA: Diagnosis not present

## 2014-11-29 LAB — CBC
HCT: 49 % (ref 39.0–52.0)
Hemoglobin: 17.4 g/dL — ABNORMAL HIGH (ref 13.0–17.0)
MCH: 33 pg (ref 26.0–34.0)
MCHC: 35.5 g/dL (ref 30.0–36.0)
MCV: 92.8 fL (ref 78.0–100.0)
Platelets: 130 10*3/uL — ABNORMAL LOW (ref 150–400)
RBC: 5.28 MIL/uL (ref 4.22–5.81)
RDW: 12.6 % (ref 11.5–15.5)
WBC: 11.9 10*3/uL — ABNORMAL HIGH (ref 4.0–10.5)

## 2014-11-29 LAB — URINE MICROSCOPIC-ADD ON

## 2014-11-29 LAB — MRSA PCR SCREENING: MRSA BY PCR: NEGATIVE

## 2014-11-29 LAB — COMPREHENSIVE METABOLIC PANEL
ALBUMIN: 4.6 g/dL (ref 3.5–5.0)
ALT: 14 U/L — ABNORMAL LOW (ref 17–63)
ANION GAP: 11 (ref 5–15)
AST: 26 U/L (ref 15–41)
Alkaline Phosphatase: 69 U/L (ref 38–126)
BUN: 20 mg/dL (ref 6–20)
CO2: 19 mmol/L — AB (ref 22–32)
Calcium: 9.6 mg/dL (ref 8.9–10.3)
Chloride: 108 mmol/L (ref 101–111)
Creatinine, Ser: 1.13 mg/dL (ref 0.61–1.24)
GFR calc Af Amer: >60 mL/min (ref >60)
GFR calc non Af Amer: >60 mL/min (ref >60)
GLUCOSE: 90 mg/dL (ref 65–99)
POTASSIUM: 3.5 mmol/L (ref 3.5–5.1)
SODIUM: 138 mmol/L (ref 135–145)
Total Bilirubin: 1.3 mg/dL — ABNORMAL HIGH (ref 0.3–1.2)
Total Protein: 6.7 g/dL (ref 6.5–8.1)

## 2014-11-29 LAB — I-STAT TROPONIN, ED: Troponin i, poc: 0 ng/mL (ref 0.00–0.08)

## 2014-11-29 LAB — APTT: aPTT: 29 seconds (ref 24–37)

## 2014-11-29 LAB — DIFFERENTIAL
Basophils Absolute: 0 10*3/uL (ref 0.0–0.1)
Basophils Relative: 0 %
EOS ABS: 0 10*3/uL (ref 0.0–0.7)
EOS PCT: 0 %
Lymphocytes Relative: 8 %
Lymphs Abs: 1 10*3/uL (ref 0.7–4.0)
Monocytes Absolute: 1 10*3/uL (ref 0.1–1.0)
Monocytes Relative: 8 %
NEUTROS PCT: 84 %
Neutro Abs: 9.9 10*3/uL — ABNORMAL HIGH (ref 1.7–7.7)

## 2014-11-29 LAB — I-STAT CHEM 8, ED
BUN: 24 mg/dL — ABNORMAL HIGH (ref 6–20)
Calcium, Ion: 1.19 mmol/L (ref 1.12–1.23)
Chloride: 107 mmol/L (ref 101–111)
Creatinine, Ser: 1 mg/dL (ref 0.61–1.24)
Glucose, Bld: 86 mg/dL (ref 65–99)
HEMATOCRIT: 54 % — AB (ref 39.0–52.0)
HEMOGLOBIN: 18.4 g/dL — AB (ref 13.0–17.0)
Potassium: 3.4 mmol/L — ABNORMAL LOW (ref 3.5–5.1)
SODIUM: 142 mmol/L (ref 135–145)
TCO2: 20 mmol/L (ref 0–100)

## 2014-11-29 LAB — URINALYSIS, ROUTINE W REFLEX MICROSCOPIC
BILIRUBIN URINE: NEGATIVE
Glucose, UA: NEGATIVE mg/dL
Ketones, ur: 40 mg/dL — AB
Leukocytes, UA: NEGATIVE
NITRITE: NEGATIVE
PH: 5.5 (ref 5.0–8.0)
Protein, ur: NEGATIVE mg/dL
Specific Gravity, Urine: 1.02 (ref 1.005–1.030)
Urobilinogen, UA: 0.2 mg/dL (ref 0.0–1.0)

## 2014-11-29 LAB — RAPID URINE DRUG SCREEN, HOSP PERFORMED
AMPHETAMINES: NOT DETECTED
Barbiturates: NOT DETECTED
Benzodiazepines: POSITIVE — AB
Cocaine: NOT DETECTED
OPIATES: NOT DETECTED
TETRAHYDROCANNABINOL: POSITIVE — AB

## 2014-11-29 LAB — PROTIME-INR
INR: 1.08 (ref 0.00–1.49)
Prothrombin Time: 14.2 seconds (ref 11.6–15.2)

## 2014-11-29 LAB — ETHANOL: Alcohol, Ethyl (B): <5 mg/dL (ref <5)

## 2014-11-29 MED ORDER — PANTOPRAZOLE SODIUM 40 MG PO TBEC
40.0000 mg | DELAYED_RELEASE_TABLET | ORAL | Status: DC
  Administered 2014-11-30 – 2014-12-01 (×2): 40 mg via ORAL
  Filled 2014-11-29 (×2): qty 1

## 2014-11-29 MED ORDER — ISOSORBIDE MONONITRATE ER 30 MG PO TB24
30.0000 mg | ORAL_TABLET | Freq: Every day | ORAL | Status: DC
  Administered 2014-11-30 – 2014-12-01 (×2): 30 mg via ORAL
  Filled 2014-11-29 (×2): qty 1

## 2014-11-29 MED ORDER — LABETALOL HCL 5 MG/ML IV SOLN
10.0000 mg | Freq: Once | INTRAVENOUS | Status: AC
  Administered 2014-11-29: 10 mg via INTRAVENOUS
  Filled 2014-11-29: qty 4

## 2014-11-29 MED ORDER — INFLUENZA VAC SPLIT QUAD 0.5 ML IM SUSY
0.5000 mL | PREFILLED_SYRINGE | INTRAMUSCULAR | Status: AC
  Administered 2014-11-30: 0.5 mL via INTRAMUSCULAR
  Filled 2014-11-29: qty 0.5

## 2014-11-29 MED ORDER — HYDROCODONE-ACETAMINOPHEN 10-325 MG PO TABS
1.0000 | ORAL_TABLET | Freq: Four times a day (QID) | ORAL | Status: DC | PRN
  Administered 2014-11-30 – 2014-12-01 (×5): 1 via ORAL
  Filled 2014-11-29 (×5): qty 1

## 2014-11-29 MED ORDER — CLOPIDOGREL BISULFATE 75 MG PO TABS
75.0000 mg | ORAL_TABLET | Freq: Every day | ORAL | Status: DC
  Administered 2014-11-30 – 2014-12-01 (×2): 75 mg via ORAL
  Filled 2014-11-29 (×2): qty 1

## 2014-11-29 MED ORDER — METOPROLOL TARTRATE 12.5 MG HALF TABLET
12.5000 mg | ORAL_TABLET | Freq: Two times a day (BID) | ORAL | Status: DC
  Administered 2014-11-30 – 2014-12-01 (×3): 12.5 mg via ORAL
  Filled 2014-11-29 (×3): qty 1

## 2014-11-29 MED ORDER — SENNOSIDES-DOCUSATE SODIUM 8.6-50 MG PO TABS: 1.0000 | ORAL_TABLET | Freq: Every evening | ORAL | Status: DC | PRN

## 2014-11-29 MED ORDER — SODIUM CHLORIDE 0.9 % IV SOLN
INTRAVENOUS | Status: AC
  Administered 2014-11-29: via INTRAVENOUS

## 2014-11-29 MED ORDER — NITROGLYCERIN 0.4 MG SL SUBL: 0.4000 mg | SUBLINGUAL_TABLET | SUBLINGUAL | Status: DC | PRN

## 2014-11-29 MED ORDER — LEVOTHYROXINE SODIUM 100 MCG PO TABS
100.0000 ug | ORAL_TABLET | Freq: Every day | ORAL | Status: DC
  Administered 2014-11-30 – 2014-12-01 (×2): 100 ug via ORAL
  Filled 2014-11-29 (×2): qty 1

## 2014-11-29 MED ORDER — TRAZODONE HCL 100 MG PO TABS
200.0000 mg | ORAL_TABLET | Freq: Every day | ORAL | Status: DC
  Administered 2014-11-30: 200 mg via ORAL
  Filled 2014-11-29 (×3): qty 2

## 2014-11-29 MED ORDER — HYDRALAZINE HCL 20 MG/ML IJ SOLN: 10.0000 mg | INTRAMUSCULAR | Status: DC | PRN

## 2014-11-29 MED ORDER — STROKE: EARLY STAGES OF RECOVERY BOOK
Freq: Once | Status: AC
  Administered 2014-11-29: 23:00:00
  Filled 2014-11-29: qty 1

## 2014-11-29 MED ORDER — ENOXAPARIN SODIUM 40 MG/0.4ML ~~LOC~~ SOLN
40.0000 mg | SUBCUTANEOUS | Status: DC
  Administered 2014-11-29 – 2014-11-30 (×2): 40 mg via SUBCUTANEOUS
  Filled 2014-11-29 (×2): qty 0.4

## 2014-11-29 MED ORDER — ASPIRIN EC 81 MG PO TBEC
81.0000 mg | DELAYED_RELEASE_TABLET | ORAL | Status: DC
  Administered 2014-11-30: 81 mg via ORAL
  Filled 2014-11-29 (×2): qty 1

## 2014-11-29 MED ORDER — ALPRAZOLAM 0.5 MG PO TABS
1.0000 mg | ORAL_TABLET | Freq: Three times a day (TID) | ORAL | Status: DC | PRN
Start: 2014-11-29 — End: 2014-12-01
  Administered 2014-11-30: 1 mg via ORAL
  Filled 2014-11-29: qty 2

## 2014-11-29 MED ORDER — IOHEXOL 350 MG/ML SOLN
100.0000 mL | Freq: Once | INTRAVENOUS | Status: AC | PRN
  Administered 2014-11-29: 100 mL via INTRAVENOUS
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
## 2014-11-29 NOTE — ED Notes (Signed)
Per EMS, Patient lives at a facility in an apartment by himself. Patient went down to speak with the lady at the front desk at around 1200. Patient was seen throughout the afternoon and then at 1630, the front desk lady saw patient and report slurred speech and patient had unsteady gait. EMS arrived and reported left-sided facial droop, left arm weakness, and slurred speech. At facility, patient was given 324 mg of Aspirin and one spray of Nitro.  Patient is alert and oriented x4. Hx of Stent, Pacemaker, MI, HTN, Stage 4 Cancer. Vitals per EMS: 147/109, 95 HR, 99% on RA, 30 Capnography, CBG 115.

## 2014-11-29 NOTE — ED Notes (Signed)
Patient reports, "My head started hurting and then I vomited and I must have fallen down. The next thing I remember is the ambulance was there to pick me up. My head, chest, and arm just all hit me at the same time. Patient reports blurred vision.

## 2014-11-30 ENCOUNTER — Inpatient Hospital Stay (HOSPITAL_BASED_OUTPATIENT_CLINIC_OR_DEPARTMENT_OTHER): Payer: Medicare HMO

## 2014-11-30 ENCOUNTER — Inpatient Hospital Stay (HOSPITAL_COMMUNITY): Payer: Medicare HMO

## 2014-11-30 DIAGNOSIS — R079 Chest pain, unspecified: Secondary | ICD-10-CM

## 2014-11-30 DIAGNOSIS — I480 Paroxysmal atrial fibrillation: Secondary | ICD-10-CM

## 2014-11-30 DIAGNOSIS — I639 Cerebral infarction, unspecified: Secondary | ICD-10-CM | POA: Diagnosis not present

## 2014-11-30 DIAGNOSIS — C099 Malignant neoplasm of tonsil, unspecified: Secondary | ICD-10-CM

## 2014-11-30 DIAGNOSIS — I6789 Other cerebrovascular disease: Secondary | ICD-10-CM

## 2014-11-30 LAB — CBC
HCT: 46.5 % (ref 39.0–52.0)
HEMATOCRIT: 46.2 % (ref 39.0–52.0)
HEMOGLOBIN: 16.3 g/dL (ref 13.0–17.0)
Hemoglobin: 16.4 g/dL (ref 13.0–17.0)
MCH: 32.8 pg (ref 26.0–34.0)
MCH: 33.2 pg (ref 26.0–34.0)
MCHC: 35.3 g/dL (ref 30.0–36.0)
MCHC: 35.3 g/dL (ref 30.0–36.0)
MCV: 94.1 fL (ref 78.0–100.0)
MCV: 93 fL (ref 78.0–100.0)
PLATELETS: 134 10*3/uL — AB (ref 150–400)
Platelets: 120 10*3/uL — ABNORMAL LOW (ref 150–400)
RBC: 4.91 MIL/uL (ref 4.22–5.81)
RBC: 5 MIL/uL (ref 4.22–5.81)
RDW: 12.8 % (ref 11.5–15.5)
RDW: 12.6 % (ref 11.5–15.5)
WBC: 6.3 10*3/uL (ref 4.0–10.5)
WBC: 7.4 10*3/uL (ref 4.0–10.5)

## 2014-11-30 LAB — CREATININE, SERUM
CREATININE: 0.85 mg/dL (ref 0.61–1.24)
GFR calc Af Amer: >60 mL/min (ref >60)

## 2014-11-30 LAB — COMPREHENSIVE METABOLIC PANEL
ALBUMIN: 3.9 g/dL (ref 3.5–5.0)
ALT: 13 U/L — ABNORMAL LOW (ref 17–63)
ANION GAP: 9 (ref 5–15)
AST: 20 U/L (ref 15–41)
Alkaline Phosphatase: 57 U/L (ref 38–126)
BILIRUBIN TOTAL: 1.6 mg/dL — AB (ref 0.3–1.2)
BUN: 18 mg/dL (ref 6–20)
CO2: 22 mmol/L (ref 22–32)
Calcium: 9.1 mg/dL (ref 8.9–10.3)
Chloride: 108 mmol/L (ref 101–111)
Creatinine, Ser: 0.97 mg/dL (ref 0.61–1.24)
Glucose, Bld: 76 mg/dL (ref 65–99)
POTASSIUM: 3.8 mmol/L (ref 3.5–5.1)
Sodium: 139 mmol/L (ref 135–145)
TOTAL PROTEIN: 5.7 g/dL — AB (ref 6.5–8.1)

## 2014-11-30 LAB — LIPID PANEL
Cholesterol: 235 mg/dL — ABNORMAL HIGH (ref 0–200)
HDL: 43 mg/dL (ref >40)
LDL CALC: 177 mg/dL — AB (ref 0–99)
TRIGLYCERIDES: 77 mg/dL (ref <150)
Total CHOL/HDL Ratio: 5.5 RATIO
VLDL: 15 mg/dL (ref 0–40)

## 2014-11-30 LAB — TROPONIN I: Troponin I: <0.03 ng/mL (ref <0.031)

## 2014-11-30 MED ORDER — EZETIMIBE 10 MG PO TABS
10.0000 mg | ORAL_TABLET | Freq: Every day | ORAL | Status: DC
  Administered 2014-11-30 – 2014-12-01 (×2): 10 mg via ORAL
  Filled 2014-11-30 (×3): qty 1

## 2014-11-30 MED ORDER — CHLORHEXIDINE GLUCONATE 0.12 % MT SOLN
15.0000 mL | Freq: Two times a day (BID) | OROMUCOSAL | Status: DC
Start: 2014-11-30 — End: 2014-12-01
  Administered 2014-11-30 – 2014-12-01 (×3): 15 mL via OROMUCOSAL
  Filled 2014-11-30 (×3): qty 15

## 2014-11-30 MED ORDER — CETYLPYRIDINIUM CHLORIDE 0.05 % MT LIQD
7.0000 mL | Freq: Two times a day (BID) | OROMUCOSAL | Status: DC
  Administered 2014-11-30 – 2014-12-01 (×4): 7 mL via OROMUCOSAL

## 2014-11-30 MED ORDER — ASPIRIN EC 325 MG PO TBEC: 325.0000 mg | DELAYED_RELEASE_TABLET | Freq: Every day | ORAL | Status: DC

## 2014-11-30 MED ORDER — ASPIRIN EC 81 MG PO TBEC
81.0000 mg | DELAYED_RELEASE_TABLET | Freq: Every day | ORAL | Status: DC
  Administered 2014-12-01: 81 mg via ORAL
  Filled 2014-11-30: qty 1

## 2014-11-30 MED ORDER — ASPIRIN 300 MG RE SUPP: 300.0000 mg | Freq: Every day | RECTAL | Status: DC

## 2014-11-30 NOTE — Consult Note (Signed)
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 intracranial circulation. No significant intracranial atheromatous disease or focal stenosis. 2. Question 2 mm focal outpouching arising from the cavernous right ICA, which may reflect a small aneurysm. This projects laterally and slightly posteriorly. Correlation with CTA of the head may be helpful for corroboration of this finding as clinically desired.   Electronically Signed   By: Jeannine Boga M.D.   On: 11/30/2014 02:54   Ct Angio Chest Aorta W/cm &/or Wo/cm  11/29/2014   CLINICAL DATA:  Chest pain, history of prior cardiac stents  EXAM: CT ANGIOGRAPHY CHEST, ABDOMEN AND PELVIS  TECHNIQUE: Multidetector CT imaging through the chest, abdomen and pelvis was performed using the standard protocol during bolus administration of intravenous contrast. Multiplanar reconstructed images and MIPs were obtained and reviewed to evaluate  the vascular anatomy.  CONTRAST:  19m OMNIPAQUE IOHEXOL 350 MG/ML SOLN  COMPARISON:  10/10/2012  FINDINGS: CTA CHEST FINDINGS  Lungs are well aerated bilaterally. Biapical scarring is again seen and stable. No focal parenchymal  infiltrate or nodule is identified. The thoracic aorta and its branches are within normal limits. No evidence of dissection or aneurysmal dilatation is seen. Coronary calcifications are seen with evidence of coronary stent placement. The pulmonary artery is well visualized and demonstrates a normal branching pattern. No findings to suggest pulmonary emboli are identified. No significant hilar or mediastinal adenopathy is seen.  Review of the MIP images confirms the above findings.  CTA ABDOMEN AND PELVIS FINDINGS  Nonvascular: The liver, gallbladder, spleen and pancreas are all normal in their CT appearance. The adrenal glands demonstrate some mild hypertrophy is well as calcification in the right adrenal gland although no focal mass lesion is seen. The kidneys demonstrate a normal enhancement pattern with the exception of some mild areas of cortical thinning in the upper pole of the right kidney likely related to scarring.  The appendix is well visualized and within normal limits. Scattered areas of diverticulosis are seen without evidence of diverticulitis. The bladder is well distended. No pelvic mass lesion or sidewall adenopathy is noted. No significant retroperitoneal adenopathy is noted. 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 No acute bony abnormality is seen.  Vascular: The abdominal aorta demonstrates some mild atherosclerotic calcifications although tapers in a normal fashion without aneurysmal dilatation or focal dissection. The iliac arteries and femoral arteries are within normal limits.  The celiac axis, superior mesenteric artery and inferior mesenteric artery are all widely patent. Single renal artery is identified on the right. On the left there are 2 main renal arteries with a small accessory renal artery arising from the proximal portion of the more superior main left renal artery. No significant focal stenoses are identified.  Review of the  MIP images confirms the above findings.  IMPRESSION: CTA of the chest: No evidence of aortic dissection or aneurysmal dilatation.  No evidence of pulmonary emboli.  CTA of the abdomen and pelvis:  No acute abnormality is noted.   Electronically Signed   By: MInez CatalinaM.D.   On: 11/29/2014 18:13   Ct Cta Abd/pel W/cm &/or W/o Cm  11/29/2014   CLINICAL DATA:  Chest pain, history of prior cardiac stents  EXAM: CT ANGIOGRAPHY CHEST, ABDOMEN AND PELVIS  TECHNIQUE: Multidetector CT imaging through the chest, abdomen and pelvis was performed using the standard protocol during bolus administration of intravenous contrast. Multiplanar reconstructed images and MIPs were obtained and reviewed to evaluate the vascular anatomy.  CONTRAST:  1032mOMNIPAQUE IOHEXOL 350 MG/ML SOLN  COMPARISON:  10/10/2012  FINDINGS: CTA CHEST FINDINGS  Lungs are well aerated bilaterally. Biapical scarring is again seen and stable. No focal parenchymal infiltrate or nodule is identified. The thoracic aorta and its branches are within normal limits. No evidence of dissection or aneurysmal dilatation is seen. Coronary calcifications are seen with evidence of coronary stent placement. The pulmonary artery is well visualized and demonstrates a normal branching pattern. No findings to suggest pulmonary emboli are identified. No significant hilar or mediastinal adenopathy is seen.  Review of the MIP images confirms the above findings.  CTA ABDOMEN AND PELVIS FINDINGS  Nonvascular: The liver, gallbladder, spleen and pancreas are all normal in their CT appearance. The adrenal glands demonstrate some mild hypertrophy is well as calcification in the right adrenal gland  although no focal mass lesion is seen. The kidneys demonstrate a normal enhancement pattern with the exception of some mild areas of cortical thinning in the upper pole of the right kidney likely related to scarring.  The appendix is well visualized and within normal limits. Scattered  areas of diverticulosis are seen without evidence of diverticulitis. The bladder is well distended. No pelvic mass lesion or sidewall adenopathy is noted. No significant retroperitoneal adenopathy is noted. 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 No acute bony abnormality is seen.  Vascular: The abdominal aorta demonstrates some mild atherosclerotic calcifications although tapers in a normal fashion without aneurysmal dilatation or focal dissection. The iliac arteries and femoral arteries are within normal limits.  The celiac axis, superior mesenteric artery and inferior mesenteric artery are all widely patent. Single renal artery is identified on the right. On the left there are 2 main renal arteries with a small accessory renal artery arising from the proximal portion of the more superior main left renal artery. No significant focal stenoses are identified.  Review of the MIP images confirms the above findings.  IMPRESSION: CTA of the chest: No evidence of aortic dissection or aneurysmal dilatation.  No evidence of pulmonary emboli.  CTA of the abdomen and pelvis:  No acute abnormality is noted.   Electronically Signed   By: Inez Catalina M.D.   On: 11/29/2014 18:13    Assessment/Plan 1 chest pain-very difficult evaluation. He appears to have chronic chest pain and his symptoms do not appear to have changed following his previous PCI. His electrocardiogram is normal. Enzymes are negative. Once he completes evaluation for possible CVA can proceed with nuclear study for risk stratification. 2 possible CVA-further evaluation per neurology. 3 paroxysmal atrial fibrillation-there is atrial fibrillation noted on telemetry. CHADSvasc 4 (if pt has truly had CVA or TIA). Very difficult situation. He would benefit from anticoagulation long-term and I would recommend apixaban 5 mg BID once ok with neurology. His risk for bleeding complications would be high as he  apparently falls occasionally and has a history of substance abuse. However at this point I feel benefit outweighs risk particularly if patient is felt to truly have had CVA. Would discontinue Plavix once apixaban initiated and continue ASA 81 mg daily. Check echocardiogram. Check TSH. Continue metoprolol. 4 coronary artery disease-previous PCI as outlined in history of present illness. It has now been one year since his previous intervention. Given documented need for anticoagulation would discontinue Plavix and continue aspirin 81 mg daily. Continue statin. 5 Substance abuse-patient is tremulous during the examination. Question early withdrawal. Further evaluation and management per primary care. 6 hyperlipidemia-continue statin. 7 hypertension-continue present medications and follow.  Kirk Ruths MD 11/30/2014, 8:14 AM
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
## 2014-11-30 NOTE — Evaluation (Signed)
Physical Therapy Evaluation Patient Details Name: Bobby Floyd MRN: [id number] DOB: [birth year] Today's Date: 11/30/2014   History of Present Illness  Pt admitted with L side weakness, facial drooping and fall with uncontrolled BP x 2 weeks, MRI negative for stroke.  Pt with hx of CAD with stenting, HLD, chronic pain, anxiety, tonsillar carcinoma.  Clinical Impression  .Pt is at or close to baseline functioning and should be safe at home with available assist. There are no further acute PT needs.  Will sign off at this time.     Follow Up Recommendations No PT follow up    Equipment Recommendations  None recommended by PT    Recommendations for Other Services       Precautions / Restrictions Restrictions Weight Bearing Restrictions: No      Mobility  Bed Mobility Overal bed mobility: Modified Independent                Transfers Overall transfer level: Needs assistance Equipment used: None Transfers: Sit to/from Stand Sit to Stand: Modified independent (Device/Increase time)         General transfer comment: assist for lines, pt with mild tremor  Ambulation/Gait Ambulation/Gait assistance: Independent Ambulation Distance (Feet): 500 Feet Assistive device: None Gait Pattern/deviations: WFL(Within Functional Limits) Gait velocity: fast and age appropriate Gait velocity interpretation: at or above normal speed for age/gender General Gait Details: steady and fluid  Stairs            Wheelchair Mobility    Modified Rankin (Stroke Patients Only)       Balance Overall balance assessment: No apparent balance deficits (not formally assessed)                               Standardized Balance Assessment Standardized Balance Assessment : Dynamic Gait Index   Dynamic Gait Index Level Surface: Normal Change in Gait Speed: Normal Gait with Horizontal Head Turns: Normal Gait with Vertical Head Turns: Normal Gait and Pivot Turn:  Normal Step Over Obstacle: Normal Step Around Obstacles: Normal Steps: Normal Total Score: 24       Pertinent Vitals/Pain Pain Assessment: Faces Faces Pain Scale: No hurt Pain Location: L cheek Pain Intervention(s): Premedicated before session    Home Living Family/patient expects to be discharged to:: Private residence Living Arrangements: Alone   Type of Home: Apartment Home Access: Level entry     Home Layout: One level Home Equipment: Grab bars - tub/shower;Kasandra Knudsen - single point Additional Comments: drives, been disability since his cancer in '09    Prior Function Level of Independence: Independent         Comments: pt volunteers in the community     Hand Dominance   Dominant Hand: Right    Extremity/Trunk Assessment   Upper Extremity Assessment: LUE deficits/detail       LUE Deficits / Details: able to use L UE as an assist, weakness is baseline from previous surgeries in which they grafted from his arm for throat reconstruction, mild tremor   Lower Extremity Assessment: Overall WFL for tasks assessed      Cervical / Trunk Assessment: Normal  Communication   Communication: HOH (in L ear)  Cognition Arousal/Alertness: Awake/alert Behavior During Therapy: WFL for tasks assessed/performed Overall Cognitive Status: Within Functional Limits for tasks assessed                      General Comments  Exercises        Assessment/Plan    PT Assessment Patent does not need any further PT services  PT Diagnosis     PT Problem List    PT Treatment Interventions     PT Goals (Current goals can be found in the Care Plan section) Acute Rehab PT Goals Patient Stated Goal: return home PT Goal Formulation: All assessment and education complete, DC therapy    Frequency     Barriers to discharge        Co-evaluation               End of Session   Activity Tolerance: Patient tolerated treatment well Patient left: in bed;with  call bell/phone within reach Nurse Communication: Mobility status         Time: 2072-1828 PT Time Calculation (min) (ACUTE ONLY): 17 min   Charges:   PT Evaluation $Initial PT Evaluation Tier I: 1 Procedure     PT G Codes:        Mottinger, Tessie Fass 11/30/2014, 4:14 PM 11/30/2014  Donnella Sham, PT [phone]  (pager)
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
## 2014-11-30 NOTE — Progress Notes (Signed)
Pt received alert, verbal with no noted distress. Pt stable, neuro intact. Able to follow commands. Pt oriented to room. Safety measures in place. Call bell within reach. Will continue to monitor.

## 2014-11-30 NOTE — Progress Notes (Signed)
Dr. Tana Coast notified to clarify Dr. Moise Boring note regarding diet order/swallow status.  She said since pt failed ED swallow screen, make NPO until SLP eval.

## 2014-11-30 NOTE — Evaluation (Signed)
Occupational Therapy Evaluation Patient Details Name: Bobby Floyd MRN: [id number] DOB: May 21, 1960 Today's Date: 11/30/2014    History of Present Illness Pt admitted with L side weakness, facial drooping and fall with uncontrolled BP x 2 weeks, MRI negative for stroke.  Pt with hx of CAD with stenting, HLD, chronic pain, anxiety, tonsillar carcinoma.   Clinical Impression   Pt was independent prior to admission.  He has baseline weakness in his L UE. Overall functioning at a supervision level for OOB activities.  Will follow for ADL transfers and standing activities in preparation for return home alone.    Follow Up Recommendations  No OT follow up    Equipment Recommendations  None recommended by OT    Recommendations for Other Services       Precautions / Restrictions Restrictions Weight Bearing Restrictions: No      Mobility Bed Mobility Overal bed mobility: Modified Independent                Transfers Overall transfer level: Needs assistance Equipment used: None Transfers: Sit to/from Stand Sit to Stand: Supervision         General transfer comment: assist for lines, pt with mild tremor    Balance                                            ADL Overall ADL's : Needs assistance/impaired Eating/Feeding: Independent;Bed level   Grooming: Wash/dry hands;Standing;Supervision/safety   Upper Body Bathing: Set up;Sitting   Lower Body Bathing: Supervison/ safety;Sit to/from stand Lower Body Bathing Details (indicate cue type and reason): able to don socks with ease Upper Body Dressing : Set up;Sitting   Lower Body Dressing: Supervision/safety;Sit to/from stand   Toilet Transfer: Copy Details (indicate cue type and reason): stood to urinate                 Vision Additional Comments: glasses were broken when he fell prior to admission   Perception     Praxis      Pertinent  Vitals/Pain Pain Assessment: Faces Faces Pain Scale: Hurts a little bit Pain Location: L cheek Pain Intervention(s): Premedicated before session     Hand Dominance Right   Extremity/Trunk Assessment Upper Extremity Assessment Upper Extremity Assessment: LUE deficits/detail LUE Deficits / Details: able to use L UE as an assist, weakness is baseline from previous surgeries in which they grafted from his arm for throat reconstruction, mild tremor LUE Sensation: history of peripheral neuropathy LUE Coordination: decreased fine motor;decreased gross motor   Lower Extremity Assessment Lower Extremity Assessment: Defer to PT evaluation (pt with hx of peripheral neuropathy)   Cervical / Trunk Assessment Cervical / Trunk Assessment: Normal   Communication Communication Communication: HOH (in L ear)   Cognition Arousal/Alertness: Awake/alert Behavior During Therapy: WFL for tasks assessed/performed Overall Cognitive Status: Within Functional Limits for tasks assessed                     General Comments       Exercises       Shoulder Instructions      Home Living Family/patient expects to be discharged to:: Private residence Living Arrangements: Alone   Type of Home: Apartment Home Access: Level entry     Home Layout: One level     Bathroom Shower/Tub: Teacher, early years/pre: Standard  Home Equipment: Grab bars - tub/shower;Kasandra Knudsen - single point   Additional Comments: drives, been disability since his cancer in '09      Prior Functioning/Environment Level of Independence: Independent        Comments: pt volunteers in the community    OT Diagnosis: Generalized weakness   OT Problem List: Impaired balance (sitting and/or standing);Impaired sensation;Impaired UE functional use   OT Treatment/Interventions: Self-care/ADL training;DME and/or AE instruction    OT Goals(Current goals can be found in the care plan section) Acute Rehab OT  Goals Patient Stated Goal: return home OT Goal Formulation: With patient Time For Goal Achievement: 12/07/14 Potential to Achieve Goals: Good  OT Frequency: Min 2X/week   Barriers to D/C:            Co-evaluation              End of Session    Activity Tolerance: Patient tolerated treatment well Patient left: in bed;with call bell/phone within reach   Time: 1315-1345 OT Time Calculation (min): 30 min Charges:  OT General Charges $OT Visit: 1 Procedure OT Evaluation $Initial OT Evaluation Tier I: 1 Procedure OT Treatments $Self Care/Home Management : 8-22 mins G-Codes:    Malka So 11/30/2014, 3:20 PM [phone]

## 2014-11-30 NOTE — Progress Notes (Signed)
*  PRELIMINARY RESULTS* Echocardiogram 2D Echocardiogram has been performed.  Bobbye Charleston 11/30/2014, 10:18 AM

## 2014-11-30 NOTE — Progress Notes (Signed)
Nutrition Brief Note  Patient identified on the Malnutrition Screening Tool (MST) Report.  Wt Readings from Last 15 Encounters:  11/29/14 175 lb 14.8 oz (79.8 kg)  04/07/14 181 lb 6.4 oz (82.283 kg)  02/26/14 191 lb 12.8 oz (87 kg)  11/24/13 187 lb (84.823 kg)  11/11/13 184 lb 8.4 oz (83.7 kg)  10/16/12 174 lb (78.926 kg)  10/11/12 164 lb 6.4 oz (74.571 kg)  04/29/12 181 lb 6.4 oz (82.283 kg)  07/10/11 186 lb 6.4 oz (84.55 kg)  06/27/11 177 lb 4 oz (80.4 kg)  04/17/11 183 lb 6.4 oz (83.19 kg)  10/10/10 191 lb (86.637 kg)  03/15/11 177 lb 9.6 oz (80.559 kg)    Body mass index is 22.58 kg/(m^2). Patient meets criteria for Normal based on current BMI.   Current diet order is Regular, patient is consuming approximately 100% of meals at this time. Labs and medications reviewed.   No nutrition interventions warranted at this time. If nutrition issues arise, please consult RD.   Arthur Holms, RD, LDN Pager #: [phone] After-Hours Pager #: [phone]

## 2014-11-30 NOTE — Progress Notes (Signed)
Subjective: patietn has no complaints.  He feels unchanged from yesterday as far as his left sided weakness.   Objective: Current vital signs: BP 137/98 mmHg  Pulse 74  Temp(Src) 97.6 F (36.4 C) (Oral)  Resp 15  Ht '6\' 2"'$  (1.88 m)  Wt 79.8 kg (175 lb 14.8 oz)  BMI 22.58 kg/m2  SpO2 100% Vital signs in last 24 hours: Temp:  [97.5 F (36.4 C)-98.3 F (36.8 C)] 97.6 F (36.4 C) (09/20 0800) Pulse Rate:  [59-93] 74 (09/20 0900) Resp:  [11-22] 15 (09/20 0900) BP: (81-170)/(57-112) 137/98 mmHg (09/20 0900) SpO2:  [95 %-100 %] 100 % (09/20 0900) Weight:  [79.8 kg (175 lb 14.8 oz)] 79.8 kg (175 lb 14.8 oz) (09/19 2128)  Intake/Output from previous day: 09/19 0701 - 09/20 0700 In: 370 [I.V.:370] Out: 500 [Urine:500] Intake/Output this shift: Total I/O In: 100 [I.V.:100] Out: -  Nutritional status: Diet regular Room service appropriate?: Yes; Fluid consistency:: Thin  Neurologic Exam: General: Mental Status: Alert, oriented, thought content appropriate.  Speech dysarthric without evidence of aphasia.  Able to follow 3 step commands without difficulty. Cranial Nerves: II: Visual fields grossly normal, pupils equal, round, reactive to light and accommodation III,IV, VI: ptosis not present but cannot burry his eyelid on the left eye, extra-ocular motions intact bilaterally V,VII: smile asymmetric on the left, facial light touch sensation decreased on the left VIII: hearing decreased on the left (old) IX,X: uvula rises symmetrically XI: bilateral shoulder shrug XII: midline tongue extension without atrophy or fasciculations  Motor: Right : Upper extremity   5/5    Left:     Upper extremity   5-/5  Lower extremity   5/5     Lower extremity   5/5 Tone and bulk:normal tone throughout; no atrophy noted Sensory: Pinprick and light touch intact throughout, bilaterally Deep Tendon Reflexes:  2+ symmetric throughout  Plantars: Right: downgoing   Left: mute Cerebellar: normal  finger-to-nose,  normal heel-to-shin test   Lab Results: Basic Metabolic Panel:  Recent Labs Lab 11/29/14 1723 11/29/14 1734 11/29/14 2341 11/30/14 0513  NA 138 142  --  139  K 3.5 3.4*  --  3.8  CL 108 107  --  108  CO2 19*  --   --  22  GLUCOSE 90 86  --  76  BUN 20 24*  --  18  CREATININE 1.13 1.00 0.85 0.97  CALCIUM 9.6  --   --  9.1    Liver Function Tests:  Recent Labs Lab 11/29/14 1723 11/30/14 0513  AST 26 20  ALT 14* 13*  ALKPHOS 69 57  BILITOT 1.3* 1.6*  PROT 6.7 5.7*  ALBUMIN 4.6 3.9   No results for input(s): LIPASE, AMYLASE in the last 168 hours. No results for input(s): AMMONIA in the last 168 hours.  CBC:  Recent Labs Lab 11/29/14 1723 11/29/14 1734 11/29/14 2341 11/30/14 0513  WBC 11.9*  --  7.4 6.3  NEUTROABS 9.9*  --   --   --   HGB 17.4* 18.4* 16.4 16.3  HCT 49.0 54.0* 46.5 46.2  MCV 92.8  --  93.0 94.1  PLT 130*  --  134* 120*    Cardiac Enzymes:  Recent Labs Lab 11/29/14 2341 11/30/14 0513  TROPONINI <0.03 <0.03    Lipid Panel:  Recent Labs Lab 11/30/14 0513  CHOL 235*  TRIG 77  HDL 43  CHOLHDL 5.5  VLDL 15  LDLCALC 177*    CBG: No results for input(s): GLUCAP  in the last 168 hours.  Microbiology: Results for orders placed or performed during the hospital encounter of 11/29/14  MRSA PCR Screening     Status: None   Collection Time: 11/29/14  9:45 PM  Result Value Ref Range Status   MRSA by PCR NEGATIVE NEGATIVE Final    Comment:        The GeneXpert MRSA Assay (FDA approved for NASAL specimens only), is one component of a comprehensive MRSA colonization surveillance program. It is not intended to diagnose MRSA infection nor to guide or monitor treatment for MRSA infections.     Coagulation Studies:  Recent Labs  11/29/14 1723  LABPROT 14.2  INR 1.08    Imaging: Ct Head Wo Contrast  11/29/2014   CLINICAL DATA:  Left-sided facial droop with left arm weakness and slurred speech  EXAM: CT  HEAD WITHOUT CONTRAST  TECHNIQUE: Contiguous axial images were obtained from the base of the skull through the vertex without intravenous contrast.  COMPARISON:  10/01/2011  FINDINGS: Bony calvarium is intact. No gross soft tissue abnormality is noted. No findings to suggest acute hemorrhage, acute infarction or space-occupying mass lesion are seen.  IMPRESSION: No acute intracranial abnormality noted.  These results were called by telephone at the time of interpretation on 11/29/2014 at 5:57 pm to Dr. Doy Mince, who verbally acknowledged these results.   Electronically Signed   By: Inez Catalina M.D.   On: 11/29/2014 17:57   Mr Brain Wo Contrast  11/30/2014   CLINICAL DATA:  Initial evaluation for acute left facial droop and left-sided weakness. Evaluate for stroke.  EXAM: MRI HEAD WITHOUT CONTRAST  MRA HEAD WITHOUT CONTRAST  TECHNIQUE: Multiplanar, multiecho pulse sequences of the brain and surrounding structures were obtained without intravenous contrast. Angiographic images of the head were obtained using MRA technique without contrast.  COMPARISON:  Prior CT from 11/29/2014.  FINDINGS: MRI HEAD FINDINGS  Cerebral volume within normal limits for patient age. No significant white matter disease. Possible tiny remote right cerebellar infarct noted.  No abnormal foci of restricted diffusion to suggest acute intracranial infarct. Gray-white matter differentiation maintained. Normal intravascular flow voids are preserved. No acute intracranial hemorrhage. Single punctate chronic microhemorrhage noted within the cortical gray matter of the anterior left frontal lobe (series 9, image 18). This is of doubtful clinical significance.  No mass lesion, midline shift, or mass effect. No hydrocephalus. No extra-axial fluid collection. Possible small arachnoid cyst at the right cerebral convexity near the vertex (series 11, image 15).  Craniocervical junction within normal limits. Pituitary gland normal.  No acute abnormality  about the orbits.  Small retention cysts within the right sphenoid sinus. Scattered mucosal thickening within the ethmoidal air cells. Small right mastoid effusion noted. Inner ear structures grossly normal.  Bone marrow signal intensity within normal limits. No scalp soft tissue abnormality.  MRA HEAD FINDINGS  ANTERIOR CIRCULATION:  Visualized distal cervical segments of the internal carotid arteries are widely patent with antegrade flow. The petrous, cavernous, and supraclinoid segments are widely patent. A1 segments, anterior communicating artery common anterior cerebral arteries well opacified. M1 segments widely patent without stenosis or occlusion. MCA bifurcations within normal limits. Distal MCA branches well opacified bilaterally.  POSTERIOR CIRCULATION:  Vertebral arteries are widely patent to the vertebrobasilar junction. Posterior inferior cerebellar arteries patent bilaterally. Basilar artery widely patent. Superior cerebellar arteries well opacified. Right P1 and P2 segment widely patent. Fetal origin of the left PCA with widely patent left posterior communicating artery.  Small 2 mm  focal outpouching arising from the lateral aspect of the cavernous right ICA, suspicious for possible small aneurysm (series 6, image 85).  IMPRESSION: MRI HEAD IMPRESSION:  Negative brain MRI with no acute intracranial infarct or other process identified.  MRA HEAD IMPRESSION:  1. No large or proximal arterial branch occlusion within the intracranial circulation. No significant intracranial atheromatous disease or focal stenosis. 2. Question 2 mm focal outpouching arising from the cavernous right ICA, which may reflect a small aneurysm. This projects laterally and slightly posteriorly. Correlation with CTA of the head may be helpful for corroboration of this finding as clinically desired.   Electronically Signed   By: Jeannine Boga M.D.   On: 11/30/2014 02:54   Mr Jodene Nam Head/brain Wo Cm  11/30/2014   CLINICAL  DATA:  Initial evaluation for acute left facial droop and left-sided weakness. Evaluate for stroke.  EXAM: MRI HEAD WITHOUT CONTRAST  MRA HEAD WITHOUT CONTRAST  TECHNIQUE: Multiplanar, multiecho pulse sequences of the brain and surrounding structures were obtained without intravenous contrast. Angiographic images of the head were obtained using MRA technique without contrast.  COMPARISON:  Prior CT from 11/29/2014.  FINDINGS: MRI HEAD FINDINGS  Cerebral volume within normal limits for patient age. No significant white matter disease. Possible tiny remote right cerebellar infarct noted.  No abnormal foci of restricted diffusion to suggest acute intracranial infarct. Gray-white matter differentiation maintained. Normal intravascular flow voids are preserved. No acute intracranial hemorrhage. Single punctate chronic microhemorrhage noted within the cortical gray matter of the anterior left frontal lobe (series 9, image 18). This is of doubtful clinical significance.  No mass lesion, midline shift, or mass effect. No hydrocephalus. No extra-axial fluid collection. Possible small arachnoid cyst at the right cerebral convexity near the vertex (series 11, image 15).  Craniocervical junction within normal limits. Pituitary gland normal.  No acute abnormality about the orbits.  Small retention cysts within the right sphenoid sinus. Scattered mucosal thickening within the ethmoidal air cells. Small right mastoid effusion noted. Inner ear structures grossly normal.  Bone marrow signal intensity within normal limits. No scalp soft tissue abnormality.  MRA HEAD FINDINGS  ANTERIOR CIRCULATION:  Visualized distal cervical segments of the internal carotid arteries are widely patent with antegrade flow. The petrous, cavernous, and supraclinoid segments are widely patent. A1 segments, anterior communicating artery common anterior cerebral arteries well opacified. M1 segments widely patent without stenosis or occlusion. MCA  bifurcations within normal limits. Distal MCA branches well opacified bilaterally.  POSTERIOR CIRCULATION:  Vertebral arteries are widely patent to the vertebrobasilar junction. Posterior inferior cerebellar arteries patent bilaterally. Basilar artery widely patent. Superior cerebellar arteries well opacified. Right P1 and P2 segment widely patent. Fetal origin of the left PCA with widely patent left posterior communicating artery.  Small 2 mm focal outpouching arising from the lateral aspect of the cavernous right ICA, suspicious for possible small aneurysm (series 6, image 85).  IMPRESSION: MRI HEAD IMPRESSION:  Negative brain MRI with no acute intracranial infarct or other process identified.  MRA HEAD IMPRESSION:  1. No large or proximal arterial branch occlusion within the intracranial circulation. No significant intracranial atheromatous disease or focal stenosis. 2. Question 2 mm focal outpouching arising from the cavernous right ICA, which may reflect a small aneurysm. This projects laterally and slightly posteriorly. Correlation with CTA of the head may be helpful for corroboration of this finding as clinically desired.   Electronically Signed   By: Jeannine Boga M.D.   On: 11/30/2014 02:54   Ct  Angio Chest Aorta W/cm &/or Wo/cm  11/29/2014   CLINICAL DATA:  Chest pain, history of prior cardiac stents  EXAM: CT ANGIOGRAPHY CHEST, ABDOMEN AND PELVIS  TECHNIQUE: Multidetector CT imaging through the chest, abdomen and pelvis was performed using the standard protocol during bolus administration of intravenous contrast. Multiplanar reconstructed images and MIPs were obtained and reviewed to evaluate the vascular anatomy.  CONTRAST:  172m OMNIPAQUE IOHEXOL 350 MG/ML SOLN  COMPARISON:  10/10/2012  FINDINGS: CTA CHEST FINDINGS  Lungs are well aerated bilaterally. Biapical scarring is again seen and stable. No focal parenchymal infiltrate or nodule is identified. The thoracic aorta and its branches are  within normal limits. No evidence of dissection or aneurysmal dilatation is seen. Coronary calcifications are seen with evidence of coronary stent placement. The pulmonary artery is well visualized and demonstrates a normal branching pattern. No findings to suggest pulmonary emboli are identified. No significant hilar or mediastinal adenopathy is seen.  Review of the MIP images confirms the above findings.  CTA ABDOMEN AND PELVIS FINDINGS  Nonvascular: The liver, gallbladder, spleen and pancreas are all normal in their CT appearance. The adrenal glands demonstrate some mild hypertrophy is well as calcification in the right adrenal gland although no focal mass lesion is seen. The kidneys demonstrate a normal enhancement pattern with the exception of some mild areas of cortical thinning in the upper pole of the right kidney likely related to scarring.  The appendix is well visualized and within normal limits. Scattered areas of diverticulosis are seen without evidence of diverticulitis. The bladder is well distended. No pelvic mass lesion or sidewall adenopathy is noted. No significant retroperitoneal adenopathy is noted. 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 No acute bony abnormality is seen.  Vascular: The abdominal aorta demonstrates some mild atherosclerotic calcifications although tapers in a normal fashion without aneurysmal dilatation or focal dissection. The iliac arteries and femoral arteries are within normal limits.  The celiac axis, superior mesenteric artery and inferior mesenteric artery are all widely patent. Single renal artery is identified on the right. On the left there are 2 main renal arteries with a small accessory renal artery arising from the proximal portion of the more superior main left renal artery. No significant focal stenoses are identified.  Review of the MIP images confirms the above findings.  IMPRESSION: CTA of the chest: No  evidence of aortic dissection or aneurysmal dilatation.  No evidence of pulmonary emboli.  CTA of the abdomen and pelvis:  No acute abnormality is noted.   Electronically Signed   By: MInez CatalinaM.D.   On: 11/29/2014 18:13   Ct Cta Abd/pel W/cm &/or W/o Cm  11/29/2014   CLINICAL DATA:  Chest pain, history of prior cardiac stents  EXAM: CT ANGIOGRAPHY CHEST, ABDOMEN AND PELVIS  TECHNIQUE: Multidetector CT imaging through the chest, abdomen and pelvis was performed using the standard protocol during bolus administration of intravenous contrast. Multiplanar reconstructed images and MIPs were obtained and reviewed to evaluate the vascular anatomy.  CONTRAST:  1061mOMNIPAQUE IOHEXOL 350 MG/ML SOLN  COMPARISON:  10/10/2012  FINDINGS: CTA CHEST FINDINGS  Lungs are well aerated bilaterally. Biapical scarring is again seen and stable. No focal parenchymal infiltrate or nodule is identified. The thoracic aorta and its branches are within normal limits. No evidence of dissection or aneurysmal dilatation is seen. Coronary calcifications are seen with evidence of coronary stent placement. The pulmonary artery is well visualized and demonstrates a normal branching pattern. No  findings to suggest pulmonary emboli are identified. No significant hilar or mediastinal adenopathy is seen.  Review of the MIP images confirms the above findings.  CTA ABDOMEN AND PELVIS FINDINGS  Nonvascular: The liver, gallbladder, spleen and pancreas are all normal in their CT appearance. The adrenal glands demonstrate some mild hypertrophy is well as calcification in the right adrenal gland although no focal mass lesion is seen. The kidneys demonstrate a normal enhancement pattern with the exception of some mild areas of cortical thinning in the upper pole of the right kidney likely related to scarring.  The appendix is well visualized and within normal limits. Scattered areas of diverticulosis are seen without evidence of diverticulitis. The  bladder is well distended. No pelvic mass lesion or sidewall adenopathy is noted. No significant retroperitoneal adenopathy is noted. 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 No acute bony abnormality is seen.  Vascular: The abdominal aorta demonstrates some mild atherosclerotic calcifications although tapers in a normal fashion without aneurysmal dilatation or focal dissection. The iliac arteries and femoral arteries are within normal limits.  The celiac axis, superior mesenteric artery and inferior mesenteric artery are all widely patent. Single renal artery is identified on the right. On the left there are 2 main renal arteries with a small accessory renal artery arising from the proximal portion of the more superior main left renal artery. No significant focal stenoses are identified.  Review of the MIP images confirms the above findings.  IMPRESSION: CTA of the chest: No evidence of aortic dissection or aneurysmal dilatation.  No evidence of pulmonary emboli.  CTA of the abdomen and pelvis:  No acute abnormality is noted.   Electronically Signed   By: Inez Catalina M.D.   On: 11/29/2014 18:13    Medications:  Scheduled: . antiseptic oral rinse  7 mL Mouth Rinse q12n4p  . aspirin EC  81 mg Oral BH-q7a  . chlorhexidine  15 mL Mouth Rinse BID  . clopidogrel  75 mg Oral Daily  . enoxaparin (LOVENOX) injection  40 mg Subcutaneous Q24H  . isosorbide mononitrate  30 mg Oral Daily  . levothyroxine  100 mcg Oral QAC breakfast  . metoprolol tartrate  12.5 mg Oral BID  . pantoprazole  40 mg Oral BH-q7a  . traZODone  200 mg Oral QHS    Assessment/Plan:  54 YO male presenting with left facial droop and left sided weakness.  Left sided weakness on exam has resolved. MRI brain showed no acute CVA but shows a possible right ICA 6m outpouching. [facility]  From a [facility] stand point [facility]   Recommend: 1) finish stroke work up 2) Follow up in one year with neurology to follow right ICA 21moutpouching.    DaEtta QuillA-C [facility] [id number]/20/2016, 10:17 AM

## 2014-11-30 NOTE — Progress Notes (Signed)
[facility]                                                                              Patient Demographics  Bobby Floyd, is a 54 y.o. male, DOB - 06/08/1960, PNT:[id number]  Admit date - 11/29/2014   Admitting Physician Rise Patience, MD  Outpatient Primary MD for the patient is Maris Berger, MD  LOS - 1   Chief Complaint  Patient presents with  . Code Stroke       Brief HPI    Bobby Floyd is a 54 y.o. male history of CAD status post stenting, hypothyroidism, hyperlipidemia, chronic pain and anxiety and history of tonsillar carcinoma in remission presented to the ER because of left-sided weakness. Patient reported that he was having uncontrolled blood pressure over the last 2 weeks. He had gone to his PCP but was unable to be seen and he went back to his home today prior to admission.  At around 2:00 PM on the day of admission, patient reported that he was going to pick up his mail when he suddenly started developing left-sided headache and chest pain. He e transiently passed out and bystanders called the EMS. EMS on arrival found the patient had left facial droop and left-sided weakness. Patient was brought to the ER. CT head was negative for anything acute. Since patient also had persistent chest pain CT angiogram of the chest and abdomen was done to rule out dissection and was negative. Patient was admitted for further workup. Neurology is consulted. MRI was negative for acute infarct. Patient initially failed a swallow screen in ED, was seen by SLP, passed the swallow was steady, started on regular diet. He has a history of tonsillar carcinoma.   Assessment & Plan    Principal Problem:  Left-sided headache, facial drooping and weakness, TIA?:  - MRI of the brain negative for acute infarct - MRA showed no proximal or large arterial branch occlusion - 2-D echo, carotid Dopplers pending -   lipid panel showed cholesterol 235, LDL 177, placed  on statin - Seen by speech therapy today, started on regular diet - Neurology following, continue aspirin and Plavix per recommendations -PTOT consult   - Telemetry showed paroxysmal atrial fibrillation, cardiology consulted   Active Problems:   History of Tonsillar cancer, stage IV squamous cell left tonsil - Passed swallow study, restarted diet     Hypothyroidism -Continue Synthroid, obtain TSH   Chest pain: History of coronary disease, hyperlipidemia, recent cardiac catheterization in September 2015, normal EF with 70-80% LAD, 95-99% circumflex followed by an 80% lesion. There was a 60-70% right coronary artery. Patient had PCI of the mid circumflex as well as the distal circumflex with drug-eluting stents. He also had drug-eluting stent to his LAD - Cardiology consulted, serial cardiac enzymes are negative, EKG normal - Per cardiology, once he completes evaluation for CVA, proceed with stress test   paroxysmal atrial fibrillation - CHADS vasc4 - Per cardiology, start on apixaban 5 mg twice a day once okay with neurology  Hyperlipidemia - Patient has history of side effect/myalgias with Lipitor, placed on Zetia  Uncontrolled hypertension -  Currently stable, continue Imdur, metoprolol, hydralazine as needed with parameters  Tremulousness/anxiety - Patient reports that he has a history of anxiety, takes alprazolam but denies any alcohol use  Code Status DNR  Family Communication: Discussed in detail with the patient, all imaging results, lab results explained to the patient   Disposition PlanTransfer to neuro telemetry floor   Time Spent in minutes  25 minutes  Procedures  MRI, MRA brain  Consults   Neurology Cardiology  DVT prophylaxis Lovenox   Medications  Scheduled Meds: . antiseptic oral rinse  7 mL Mouth Rinse q12n4p  . aspirin EC  81 mg Oral BH-q7a  . chlorhexidine  15 mL Mouth Rinse BID  . clopidogrel  75 mg Oral Daily  . enoxaparin (LOVENOX)  injection  40 mg Subcutaneous Q24H  . isosorbide mononitrate  30 mg Oral Daily  . levothyroxine  100 mcg Oral QAC breakfast  . metoprolol tartrate  12.5 mg Oral BID  . pantoprazole  40 mg Oral BH-q7a  . traZODone  200 mg Oral QHS   Continuous Infusions: . sodium chloride 50 mL/hr at 11/30/14 0900   PRN Meds:.ALPRAZolam, hydrALAZINE, HYDROcodone-acetaminophen, nitroGLYCERIN, senna-docusate   Antibiotics   Anti-infectives    None        Subjective:   Bobby Floyd was seen and examined earlier this morning today. No chest pain this morning, but feels the same from yesterday with left-sided weakness. Patient denies dizziness, shortness of breath, abdominal pain, N/V/D/C. No acute events overnight.    Objective:   Blood pressure 137/98, pulse 74, temperature 97.6 F (36.4 C), temperature source Oral, resp. rate 15, height '6\' 2"'$  (1.88 m), weight 79.8 kg (175 lb 14.8 oz), SpO2 100 %.  Wt Readings from Last 3 Encounters:  11/29/14 79.8 kg (175 lb 14.8 oz)  04/07/14 82.283 kg (181 lb 6.4 oz)  02/26/14 87 kg (191 lb 12.8 oz)     Intake/Output Summary (Last 24 hours) at 11/30/14 1055 Last data filed at 11/30/14 0900  Gross per 24 hour  Intake    470 ml  Output    500 ml  Net    -30 ml    Exam  General: Alert and oriented x 3, NAD, mild dysarthria   HEENT:  PERRLA, EOMI, Anicteric Sclera, mucous membranes moist.   Neck: Supple, no JVD, no masses  CVS: S1 S2 auscultated, no rubs, murmurs or gallops. Regular rate and rhythm.  Respiratory: Clear to auscultation bilaterally, no wheezing, rales or rhonchi  Abdomen: Soft, nontender, nondistended, + bowel sounds  Ext: no cyanosis clubbing or edema  Neuro: AAOx3, Cr N's II- XII. Strength 5/5 upper and lower extremities bilaterally  Skin: No rashes  Psych: Normal affect and demeanor, alert and oriented x3    Data Review   Micro Results Recent Results (from the past 240 hour(s))  MRSA PCR Screening     Status:  None   Collection Time: 11/29/14  9:45 PM  Result Value Ref Range Status   MRSA by PCR NEGATIVE NEGATIVE Final    Comment:        The GeneXpert MRSA Assay (FDA approved for NASAL specimens only), is one component of a comprehensive MRSA colonization surveillance program. It is not intended to diagnose MRSA infection nor to guide or monitor treatment for MRSA infections.     Radiology Reports Ct Head Wo Contrast  11/29/2014   CLINICAL DATA:  Left-sided facial droop with left arm weakness and slurred speech  EXAM: CT HEAD WITHOUT CONTRAST  TECHNIQUE: Contiguous axial images were obtained from the base of the skull through the vertex without intravenous contrast.  COMPARISON:  10/01/2011  FINDINGS: Bony calvarium is intact. No gross soft tissue abnormality is noted. No findings to suggest acute hemorrhage, acute infarction or space-occupying mass lesion are seen.  IMPRESSION: No acute intracranial abnormality noted.  These results were called by telephone at the time of interpretation on 11/29/2014 at 5:57 pm to Dr. Doy Mince, who verbally acknowledged these results.   Electronically Signed   By: Inez Catalina M.D.   On: 11/29/2014 17:57   Mr Brain Wo Contrast  11/30/2014   CLINICAL DATA:  Initial evaluation for acute left facial droop and left-sided weakness. Evaluate for stroke.  EXAM: MRI HEAD WITHOUT CONTRAST  MRA HEAD WITHOUT CONTRAST  TECHNIQUE: Multiplanar, multiecho pulse sequences of the brain and surrounding structures were obtained without intravenous contrast. Angiographic images of the head were obtained using MRA technique without contrast.  COMPARISON:  Prior CT from 11/29/2014.  FINDINGS: MRI HEAD FINDINGS  Cerebral volume within normal limits for patient age. No significant white matter disease. Possible tiny remote right cerebellar infarct noted.  No abnormal foci of restricted diffusion to suggest acute intracranial infarct. Gray-white matter differentiation maintained. Normal  intravascular flow voids are preserved. No acute intracranial hemorrhage. Single punctate chronic microhemorrhage noted within the cortical gray matter of the anterior left frontal lobe (series 9, image 18). This is of doubtful clinical significance.  No mass lesion, midline shift, or mass effect. No hydrocephalus. No extra-axial fluid collection. Possible small arachnoid cyst at the right cerebral convexity near the vertex (series 11, image 15).  Craniocervical junction within normal limits. Pituitary gland normal.  No acute abnormality about the orbits.  Small retention cysts within the right sphenoid sinus. Scattered mucosal thickening within the ethmoidal air cells. Small right mastoid effusion noted. Inner ear structures grossly normal.  Bone marrow signal intensity within normal limits. No scalp soft tissue abnormality.  MRA HEAD FINDINGS  ANTERIOR CIRCULATION:  Visualized distal cervical segments of the internal carotid arteries are widely patent with antegrade flow. The petrous, cavernous, and supraclinoid segments are widely patent. A1 segments, anterior communicating artery common anterior cerebral arteries well opacified. M1 segments widely patent without stenosis or occlusion. MCA bifurcations within normal limits. Distal MCA branches well opacified bilaterally.  POSTERIOR CIRCULATION:  Vertebral arteries are widely patent to the vertebrobasilar junction. Posterior inferior cerebellar arteries patent bilaterally. Basilar artery widely patent. Superior cerebellar arteries well opacified. Right P1 and P2 segment widely patent. Fetal origin of the left PCA with widely patent left posterior communicating artery.  Small 2 mm focal outpouching arising from the lateral aspect of the cavernous right ICA, suspicious for possible small aneurysm (series 6, image 85).  IMPRESSION: MRI HEAD IMPRESSION:  Negative brain MRI with no acute intracranial infarct or other process identified.  MRA HEAD IMPRESSION:  1. No  large or proximal arterial branch occlusion within the intracranial circulation. No significant intracranial atheromatous disease or focal stenosis. 2. Question 2 mm focal outpouching arising from the cavernous right ICA, which may reflect a small aneurysm. This projects laterally and slightly posteriorly. Correlation with CTA of the head may be helpful for corroboration of this finding as clinically desired.   Electronically Signed   By: Jeannine Boga M.D.   On: 11/30/2014 02:54   Mr Jodene Nam Head/brain Wo Cm  11/30/2014   CLINICAL DATA:  Initial evaluation for acute left facial droop and left-sided weakness. Evaluate for stroke.  EXAM: MRI HEAD WITHOUT CONTRAST  MRA HEAD WITHOUT CONTRAST  TECHNIQUE: Multiplanar, multiecho pulse sequences of the brain and surrounding structures were obtained without intravenous contrast. Angiographic images of the head were obtained using MRA technique without contrast.  COMPARISON:  Prior CT from 11/29/2014.  FINDINGS: MRI HEAD FINDINGS  Cerebral volume within normal limits for patient age. No significant white matter disease. Possible tiny remote right cerebellar infarct noted.  No abnormal foci of restricted diffusion to suggest acute intracranial infarct. Gray-white matter differentiation maintained. Normal intravascular flow voids are preserved. No acute intracranial hemorrhage. Single punctate chronic microhemorrhage noted within the cortical gray matter of the anterior left frontal lobe (series 9, image 18). This is of doubtful clinical significance.  No mass lesion, midline shift, or mass effect. No hydrocephalus. No extra-axial fluid collection. Possible small arachnoid cyst at the right cerebral convexity near the vertex (series 11, image 15).  Craniocervical junction within normal limits. Pituitary gland normal.  No acute abnormality about the orbits.  Small retention cysts within the right sphenoid sinus. Scattered mucosal thickening within the ethmoidal air cells.  Small right mastoid effusion noted. Inner ear structures grossly normal.  Bone marrow signal intensity within normal limits. No scalp soft tissue abnormality.  MRA HEAD FINDINGS  ANTERIOR CIRCULATION:  Visualized distal cervical segments of the internal carotid arteries are widely patent with antegrade flow. The petrous, cavernous, and supraclinoid segments are widely patent. A1 segments, anterior communicating artery common anterior cerebral arteries well opacified. M1 segments widely patent without stenosis or occlusion. MCA bifurcations within normal limits. Distal MCA branches well opacified bilaterally.  POSTERIOR CIRCULATION:  Vertebral arteries are widely patent to the vertebrobasilar junction. Posterior inferior cerebellar arteries patent bilaterally. Basilar artery widely patent. Superior cerebellar arteries well opacified. Right P1 and P2 segment widely patent. Fetal origin of the left PCA with widely patent left posterior communicating artery.  Small 2 mm focal outpouching arising from the lateral aspect of the cavernous right ICA, suspicious for possible small aneurysm (series 6, image 85).  IMPRESSION: MRI HEAD IMPRESSION:  Negative brain MRI with no acute intracranial infarct or other process identified.  MRA HEAD IMPRESSION:  1. No large or proximal arterial branch occlusion within the intracranial circulation. No significant intracranial atheromatous disease or focal stenosis. 2. Question 2 mm focal outpouching arising from the cavernous right ICA, which may reflect a small aneurysm. This projects laterally and slightly posteriorly. Correlation with CTA of the head may be helpful for corroboration of this finding as clinically desired.   Electronically Signed   By: Jeannine Boga M.D.   On: 11/30/2014 02:54   Ct Angio Chest Aorta W/cm &/or Wo/cm  11/29/2014   CLINICAL DATA:  Chest pain, history of prior cardiac stents  EXAM: CT ANGIOGRAPHY CHEST, ABDOMEN AND PELVIS  TECHNIQUE: Multidetector  CT imaging through the chest, abdomen and pelvis was performed using the standard protocol during bolus administration of intravenous contrast. Multiplanar reconstructed images and MIPs were obtained and reviewed to evaluate the vascular anatomy.  CONTRAST:  135m OMNIPAQUE IOHEXOL 350 MG/ML SOLN  COMPARISON:  10/10/2012  FINDINGS: CTA CHEST FINDINGS  Lungs are well aerated bilaterally. Biapical scarring is again seen and stable. No focal parenchymal infiltrate or nodule is identified. The thoracic aorta and its branches are within normal limits. No evidence of dissection or aneurysmal dilatation is seen. Coronary calcifications are seen with evidence of coronary stent placement. The pulmonary artery is well visualized and demonstrates a normal branching pattern. No findings to suggest  pulmonary emboli are identified. No significant hilar or mediastinal adenopathy is seen.  Review of the MIP images confirms the above findings.  CTA ABDOMEN AND PELVIS FINDINGS  Nonvascular: The liver, gallbladder, spleen and pancreas are all normal in their CT appearance. The adrenal glands demonstrate some mild hypertrophy is well as calcification in the right adrenal gland although no focal mass lesion is seen. The kidneys demonstrate a normal enhancement pattern with the exception of some mild areas of cortical thinning in the upper pole of the right kidney likely related to scarring.  The appendix is well visualized and within normal limits. Scattered areas of diverticulosis are seen without evidence of diverticulitis. The bladder is well distended. No pelvic mass lesion or sidewall adenopathy is noted. No significant retroperitoneal adenopathy is noted. 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 No acute bony abnormality is seen.  Vascular: The abdominal aorta demonstrates some mild atherosclerotic calcifications although tapers in a normal fashion without aneurysmal dilatation  or focal dissection. The iliac arteries and femoral arteries are within normal limits.  The celiac axis, superior mesenteric artery and inferior mesenteric artery are all widely patent. Single renal artery is identified on the right. On the left there are 2 main renal arteries with a small accessory renal artery arising from the proximal portion of the more superior main left renal artery. No significant focal stenoses are identified.  Review of the MIP images confirms the above findings.  IMPRESSION: CTA of the chest: No evidence of aortic dissection or aneurysmal dilatation.  No evidence of pulmonary emboli.  CTA of the abdomen and pelvis:  No acute abnormality is noted.   Electronically Signed   By: Inez Catalina M.D.   On: 11/29/2014 18:13   Ct Cta Abd/pel W/cm &/or W/o Cm  11/29/2014   CLINICAL DATA:  Chest pain, history of prior cardiac stents  EXAM: CT ANGIOGRAPHY CHEST, ABDOMEN AND PELVIS  TECHNIQUE: Multidetector CT imaging through the chest, abdomen and pelvis was performed using the standard protocol during bolus administration of intravenous contrast. Multiplanar reconstructed images and MIPs were obtained and reviewed to evaluate the vascular anatomy.  CONTRAST:  162m OMNIPAQUE IOHEXOL 350 MG/ML SOLN  COMPARISON:  10/10/2012  FINDINGS: CTA CHEST FINDINGS  Lungs are well aerated bilaterally. Biapical scarring is again seen and stable. No focal parenchymal infiltrate or nodule is identified. The thoracic aorta and its branches are within normal limits. No evidence of dissection or aneurysmal dilatation is seen. Coronary calcifications are seen with evidence of coronary stent placement. The pulmonary artery is well visualized and demonstrates a normal branching pattern. No findings to suggest pulmonary emboli are identified. No significant hilar or mediastinal adenopathy is seen.  Review of the MIP images confirms the above findings.  CTA ABDOMEN AND PELVIS FINDINGS  Nonvascular: The liver, gallbladder,  spleen and pancreas are all normal in their CT appearance. The adrenal glands demonstrate some mild hypertrophy is well as calcification in the right adrenal gland although no focal mass lesion is seen. The kidneys demonstrate a normal enhancement pattern with the exception of some mild areas of cortical thinning in the upper pole of the right kidney likely related to scarring.  The appendix is well visualized and within normal limits. Scattered areas of diverticulosis are seen without evidence of diverticulitis. The bladder is well distended. No pelvic mass lesion or sidewall adenopathy is noted. No significant retroperitoneal adenopathy is noted. 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  No acute bony abnormality is seen.  Vascular: The abdominal aorta demonstrates some mild atherosclerotic calcifications although tapers in a normal fashion without aneurysmal dilatation or focal dissection. The iliac arteries and femoral arteries are within normal limits.  The celiac axis, superior mesenteric artery and inferior mesenteric artery are all widely patent. Single renal artery is identified on the right. On the left there are 2 main renal arteries with a small accessory renal artery arising from the proximal portion of the more superior main left renal artery. No significant focal stenoses are identified.  Review of the MIP images confirms the above findings.  IMPRESSION: CTA of the chest: No evidence of aortic dissection or aneurysmal dilatation.  No evidence of pulmonary emboli.  CTA of the abdomen and pelvis:  No acute abnormality is noted.   Electronically Signed   By: Inez Catalina M.D.   On: 11/29/2014 18:13    CBC  Recent Labs Lab 11/29/14 1723 11/29/14 1734 11/29/14 2341 11/30/14 0513  WBC 11.9*  --  7.4 6.3  HGB 17.4* 18.4* 16.4 16.3  HCT 49.0 54.0* 46.5 46.2  PLT 130*  --  134* 120*  MCV 92.8  --  93.0 94.1  MCH 33.0  --  32.8 33.2  MCHC 35.5   --  35.3 35.3  RDW 12.6  --  12.6 12.8  LYMPHSABS 1.0  --   --   --   MONOABS 1.0  --   --   --   EOSABS 0.0  --   --   --   BASOSABS 0.0  --   --   --     Chemistries   Recent Labs Lab 11/29/14 1723 11/29/14 1734 11/29/14 2341 11/30/14 0513  NA 138 142  --  139  K 3.5 3.4*  --  3.8  CL 108 107  --  108  CO2 19*  --   --  22  GLUCOSE 90 86  --  76  BUN 20 24*  --  18  CREATININE 1.13 1.00 0.85 0.97  CALCIUM 9.6  --   --  9.1  AST 26  --   --  20  ALT 14*  --   --  13*  ALKPHOS 69  --   --  57  BILITOT 1.3*  --   --  1.6*   ------------------------------------------------------------------------------------------------------------------ estimated creatinine clearance is 101.7 mL/min (by C-G formula based on Cr of 0.97). ------------------------------------------------------------------------------------------------------------------ No results for input(s): HGBA1C in the last 72 hours. ------------------------------------------------------------------------------------------------------------------  Recent Labs  11/30/14 0513  CHOL 235*  HDL 43  LDLCALC 177*  TRIG 77  CHOLHDL 5.5   ------------------------------------------------------------------------------------------------------------------ No results for input(s): TSH, T4TOTAL, T3FREE, THYROIDAB in the last 72 hours.  Invalid input(s): FREET3 ------------------------------------------------------------------------------------------------------------------ No results for input(s): VITAMINB12, FOLATE, FERRITIN, TIBC, IRON, RETICCTPCT in the last 72 hours.  Coagulation profile  Recent Labs Lab 11/29/14 1723  INR 1.08    No results for input(s): DDIMER in the last 72 hours.  Cardiac Enzymes  Recent Labs Lab 11/29/14 2341 11/30/14 0513  TROPONINI <0.03 <0.03   ------------------------------------------------------------------------------------------------------------------ Invalid input(s):  POCBNP  No results for input(s): GLUCAP in the last 72 hours.   RAI,RIPUDEEP M.D. [facility] 11/30/2014, 10:55 AM  Pager: [phone] Between 7am to 7pm - call Pager - [phone]  After 7pm go to [email or web address] - password [email or web address]  Call night coverage person covering after 7pm

## 2014-12-01 ENCOUNTER — Inpatient Hospital Stay (HOSPITAL_COMMUNITY): Payer: Medicare HMO

## 2014-12-01 DIAGNOSIS — G459 Transient cerebral ischemic attack, unspecified: Secondary | ICD-10-CM

## 2014-12-01 DIAGNOSIS — I639 Cerebral infarction, unspecified: Secondary | ICD-10-CM

## 2014-12-01 DIAGNOSIS — I480 Paroxysmal atrial fibrillation: Secondary | ICD-10-CM

## 2014-12-01 LAB — BASIC METABOLIC PANEL
ANION GAP: 10 (ref 5–15)
BUN: 13 mg/dL (ref 6–20)
CALCIUM: 8.9 mg/dL (ref 8.9–10.3)
CO2: 21 mmol/L — AB (ref 22–32)
Chloride: 105 mmol/L (ref 101–111)
Creatinine, Ser: 0.95 mg/dL (ref 0.61–1.24)
GFR calc non Af Amer: >60 mL/min (ref >60)
Glucose, Bld: 89 mg/dL (ref 65–99)
POTASSIUM: 3.7 mmol/L (ref 3.5–5.1)
Sodium: 136 mmol/L (ref 135–145)

## 2014-12-01 LAB — CBC
HCT: 46.6 % (ref 39.0–52.0)
HEMOGLOBIN: 16.1 g/dL (ref 13.0–17.0)
MCH: 32.5 pg (ref 26.0–34.0)
MCHC: 34.5 g/dL (ref 30.0–36.0)
MCV: 94 fL (ref 78.0–100.0)
Platelets: 130 10*3/uL — ABNORMAL LOW (ref 150–400)
RBC: 4.96 MIL/uL (ref 4.22–5.81)
RDW: 12.7 % (ref 11.5–15.5)
WBC: 5.6 10*3/uL (ref 4.0–10.5)

## 2014-12-01 LAB — HEMOGLOBIN A1C
HEMOGLOBIN A1C: 5.7 % — AB (ref 4.8–5.6)
Mean Plasma Glucose: 117 mg/dL

## 2014-12-01 MED ORDER — EZETIMIBE 10 MG PO TABS: 10.0000 mg | ORAL_TABLET | Freq: Every day | ORAL | Status: DC

## 2014-12-01 MED ORDER — APIXABAN 5 MG PO TABS: 5.0000 mg | ORAL_TABLET | Freq: Two times a day (BID) | ORAL | Status: DC

## 2014-12-01 NOTE — Care Management Note (Signed)
Case Management Note  Patient Details  Name: [name] MRN: [id number] Date of Birth: [birth year]  Subjective/Objective:                    Action/Plan: Pt admitted with TIA. Pt lives at home alone. Pt being discharged home on Eliquis. Pt's benefits check indicated a $47 co pay monthly. Pt given 30 day free card and instructions on use. Pt verbalized understanding.   Expected Discharge Date:                  Expected Discharge Plan:  Home/Self Care  In-House Referral:     Discharge planning Services  CM Consult  Post Acute Care Choice:    Choice offered to:     DME Arranged:    DME Agency:     HH Arranged:    HH Agency:     Status of Service:  In process, will continue to follow  Medicare Important Message Given:    Date Medicare IM Given:    Medicare IM give by:    Date Additional Medicare IM Given:    Additional Medicare Important Message give by:     If discussed at [place] of Stay Meetings, dates discussed:    Additional Comments:  Pollie Friar, RN 12/01/2014, 2:23 PM

## 2014-12-01 NOTE — Progress Notes (Addendum)
Subjective: No change from yesterday.  Reports that he is back to baseline.  Perseverating on taking a shower.   Objective: Current vital signs: BP 113/64 mmHg  Pulse 63  Temp(Src) 97.4 F (36.3 C) (Oral)  Resp 17  Ht '6\' 2"'$  (1.88 m)  Wt 79.8 kg (175 lb 14.8 oz)  BMI 22.58 kg/m2  SpO2 100% Vital signs in last 24 hours: Temp:  [97.4 F (36.3 C)-98.1 F (36.7 C)] 97.4 F (36.3 C) (09/21 0532) Pulse Rate:  [63-83] 63 (09/21 0532) Resp:  [15-23] 17 (09/21 0532) BP: (91-145)/(49-89) 113/64 mmHg (09/21 0532) SpO2:  [96 %-100 %] 100 % (09/21 0532)  Intake/Output from previous day: 09/20 0701 - 09/21 0700 In: 340 [P.O.:240; I.V.:100] Out: -  Intake/Output this shift:   Nutritional status: Diet regular Room service appropriate?: Yes; Fluid consistency:: Thin  Neurologic Exam: General: Mental Status: Alert, oriented, thought content appropriate.  Speech fluent without evidence of aphasia.  Able to follow simple commands without difficulty and asking why people continually ask him to do the same things.  Cranial Nerves: HC:WCBJSE fields grossly normal, pupils equal, round, reactive to light and accommodation III,IV, VI: ptosis not present but cannot burry his eyelid on the left eye, extra-ocular motions intact bilaterally , extra-ocular motions intact bilaterally V,VII: smile asymmetric on the left, facial light touch sensation decreased on the left VIII: hearing decreased on the left (old) IX,X: uvula rises symmetrically XI: bilateral shoulder shrug XII: midline tongue extension without atrophy or fasciculations  Motor: Right : Upper extremity   5/5    Left:     Upper extremity   5/5  Lower extremity   5/5     Lower extremity   5/5 Tone and bulk:normal tone throughout; no atrophy noted Sensory: Pinprick and light touch intact throughout, bilaterally Deep Tendon Reflexes:  2+ throughout Plantars: Right: downgoing   Left: mute   Lab Results: Basic Metabolic  Panel:  Recent Labs Lab 11/29/14 1723 11/29/14 1734 11/29/14 2341 11/30/14 0513 12/01/14 0538  NA 138 142  --  139 136  K 3.5 3.4*  --  3.8 3.7  CL 108 107  --  108 105  CO2 19*  --   --  22 21*  GLUCOSE 90 86  --  76 89  BUN 20 24*  --  18 13  CREATININE 1.13 1.00 0.85 0.97 0.95  CALCIUM 9.6  --   --  9.1 8.9    Liver Function Tests:  Recent Labs Lab 11/29/14 1723 11/30/14 0513  AST 26 20  ALT 14* 13*  ALKPHOS 69 57  BILITOT 1.3* 1.6*  PROT 6.7 5.7*  ALBUMIN 4.6 3.9   No results for input(s): LIPASE, AMYLASE in the last 168 hours. No results for input(s): AMMONIA in the last 168 hours.  CBC:  Recent Labs Lab 11/29/14 1723 11/29/14 1734 11/29/14 2341 11/30/14 0513 12/01/14 0538  WBC 11.9*  --  7.4 6.3 5.6  NEUTROABS 9.9*  --   --   --   --   HGB 17.4* 18.4* 16.4 16.3 16.1  HCT 49.0 54.0* 46.5 46.2 46.6  MCV 92.8  --  93.0 94.1 94.0  PLT 130*  --  134* 120* 130*    Cardiac Enzymes:  Recent Labs Lab 11/29/14 2341 11/30/14 0513 11/30/14 1021  TROPONINI <0.03 <0.03 <0.03    Lipid Panel:  Recent Labs Lab 11/30/14 0513  CHOL 235*  TRIG 77  HDL 43  CHOLHDL 5.5  VLDL 15  LDLCALC  177*    CBG: No results for input(s): GLUCAP in the last 168 hours.  Microbiology: Results for orders placed or performed during the hospital encounter of 11/29/14  MRSA PCR Screening     Status: None   Collection Time: 11/29/14  9:45 PM  Result Value Ref Range Status   MRSA by PCR NEGATIVE NEGATIVE Final    Comment:        The GeneXpert MRSA Assay (FDA approved for NASAL specimens only), is one component of a comprehensive MRSA colonization surveillance program. It is not intended to diagnose MRSA infection nor to guide or monitor treatment for MRSA infections.     Coagulation Studies:  Recent Labs  11/29/14 1723  LABPROT 14.2  INR 1.08    Imaging: Ct Head Wo Contrast  11/29/2014   CLINICAL DATA:  Left-sided facial droop with left arm  weakness and slurred speech  EXAM: CT HEAD WITHOUT CONTRAST  TECHNIQUE: Contiguous axial images were obtained from the base of the skull through the vertex without intravenous contrast.  COMPARISON:  10/01/2011  FINDINGS: Bony calvarium is intact. No gross soft tissue abnormality is noted. No findings to suggest acute hemorrhage, acute infarction or space-occupying mass lesion are seen.  IMPRESSION: No acute intracranial abnormality noted.  These results were called by telephone at the time of interpretation on 11/29/2014 at 5:57 pm to Dr. Doy Mince, who verbally acknowledged these results.   Electronically Signed   By: Inez Catalina M.D.   On: 11/29/2014 17:57   Mr Brain Wo Contrast  11/30/2014   CLINICAL DATA:  Initial evaluation for acute left facial droop and left-sided weakness. Evaluate for stroke.  EXAM: MRI HEAD WITHOUT CONTRAST  MRA HEAD WITHOUT CONTRAST  TECHNIQUE: Multiplanar, multiecho pulse sequences of the brain and surrounding structures were obtained without intravenous contrast. Angiographic images of the head were obtained using MRA technique without contrast.  COMPARISON:  Prior CT from 11/29/2014.  FINDINGS: MRI HEAD FINDINGS  Cerebral volume within normal limits for patient age. No significant white matter disease. Possible tiny remote right cerebellar infarct noted.  No abnormal foci of restricted diffusion to suggest acute intracranial infarct. Gray-white matter differentiation maintained. Normal intravascular flow voids are preserved. No acute intracranial hemorrhage. Single punctate chronic microhemorrhage noted within the cortical gray matter of the anterior left frontal lobe (series 9, image 18). This is of doubtful clinical significance.  No mass lesion, midline shift, or mass effect. No hydrocephalus. No extra-axial fluid collection. Possible small arachnoid cyst at the right cerebral convexity near the vertex (series 11, image 15).  Craniocervical junction within normal limits.  Pituitary gland normal.  No acute abnormality about the orbits.  Small retention cysts within the right sphenoid sinus. Scattered mucosal thickening within the ethmoidal air cells. Small right mastoid effusion noted. Inner ear structures grossly normal.  Bone marrow signal intensity within normal limits. No scalp soft tissue abnormality.  MRA HEAD FINDINGS  ANTERIOR CIRCULATION:  Visualized distal cervical segments of the internal carotid arteries are widely patent with antegrade flow. The petrous, cavernous, and supraclinoid segments are widely patent. A1 segments, anterior communicating artery common anterior cerebral arteries well opacified. M1 segments widely patent without stenosis or occlusion. MCA bifurcations within normal limits. Distal MCA branches well opacified bilaterally.  POSTERIOR CIRCULATION:  Vertebral arteries are widely patent to the vertebrobasilar junction. Posterior inferior cerebellar arteries patent bilaterally. Basilar artery widely patent. Superior cerebellar arteries well opacified. Right P1 and P2 segment widely patent. Fetal origin of the left PCA with  widely patent left posterior communicating artery.  Small 2 mm focal outpouching arising from the lateral aspect of the cavernous right ICA, suspicious for possible small aneurysm (series 6, image 85).  IMPRESSION: MRI HEAD IMPRESSION:  Negative brain MRI with no acute intracranial infarct or other process identified.  MRA HEAD IMPRESSION:  1. No large or proximal arterial branch occlusion within the intracranial circulation. No significant intracranial atheromatous disease or focal stenosis. 2. Question 2 mm focal outpouching arising from the cavernous right ICA, which may reflect a small aneurysm. This projects laterally and slightly posteriorly. Correlation with CTA of the head may be helpful for corroboration of this finding as clinically desired.   Electronically Signed   By: Jeannine Boga M.D.   On: 11/30/2014 02:54   Mr  Jodene Nam Head/brain Wo Cm  11/30/2014   CLINICAL DATA:  Initial evaluation for acute left facial droop and left-sided weakness. Evaluate for stroke.  EXAM: MRI HEAD WITHOUT CONTRAST  MRA HEAD WITHOUT CONTRAST  TECHNIQUE: Multiplanar, multiecho pulse sequences of the brain and surrounding structures were obtained without intravenous contrast. Angiographic images of the head were obtained using MRA technique without contrast.  COMPARISON:  Prior CT from 11/29/2014.  FINDINGS: MRI HEAD FINDINGS  Cerebral volume within normal limits for patient age. No significant white matter disease. Possible tiny remote right cerebellar infarct noted.  No abnormal foci of restricted diffusion to suggest acute intracranial infarct. Gray-white matter differentiation maintained. Normal intravascular flow voids are preserved. No acute intracranial hemorrhage. Single punctate chronic microhemorrhage noted within the cortical gray matter of the anterior left frontal lobe (series 9, image 18). This is of doubtful clinical significance.  No mass lesion, midline shift, or mass effect. No hydrocephalus. No extra-axial fluid collection. Possible small arachnoid cyst at the right cerebral convexity near the vertex (series 11, image 15).  Craniocervical junction within normal limits. Pituitary gland normal.  No acute abnormality about the orbits.  Small retention cysts within the right sphenoid sinus. Scattered mucosal thickening within the ethmoidal air cells. Small right mastoid effusion noted. Inner ear structures grossly normal.  Bone marrow signal intensity within normal limits. No scalp soft tissue abnormality.  MRA HEAD FINDINGS  ANTERIOR CIRCULATION:  Visualized distal cervical segments of the internal carotid arteries are widely patent with antegrade flow. The petrous, cavernous, and supraclinoid segments are widely patent. A1 segments, anterior communicating artery common anterior cerebral arteries well opacified. M1 segments widely patent  without stenosis or occlusion. MCA bifurcations within normal limits. Distal MCA branches well opacified bilaterally.  POSTERIOR CIRCULATION:  Vertebral arteries are widely patent to the vertebrobasilar junction. Posterior inferior cerebellar arteries patent bilaterally. Basilar artery widely patent. Superior cerebellar arteries well opacified. Right P1 and P2 segment widely patent. Fetal origin of the left PCA with widely patent left posterior communicating artery.  Small 2 mm focal outpouching arising from the lateral aspect of the cavernous right ICA, suspicious for possible small aneurysm (series 6, image 85).  IMPRESSION: MRI HEAD IMPRESSION:  Negative brain MRI with no acute intracranial infarct or other process identified.  MRA HEAD IMPRESSION:  1. No large or proximal arterial branch occlusion within the intracranial circulation. No significant intracranial atheromatous disease or focal stenosis. 2. Question 2 mm focal outpouching arising from the cavernous right ICA, which may reflect a small aneurysm. This projects laterally and slightly posteriorly. Correlation with CTA of the head may be helpful for corroboration of this finding as clinically desired.   Electronically Signed   By: Marland Kitchen  Jeannine Boga M.D.   On: 11/30/2014 02:54   Ct Angio Chest Aorta W/cm &/or Wo/cm  11/29/2014   CLINICAL DATA:  Chest pain, history of prior cardiac stents  EXAM: CT ANGIOGRAPHY CHEST, ABDOMEN AND PELVIS  TECHNIQUE: Multidetector CT imaging through the chest, abdomen and pelvis was performed using the standard protocol during bolus administration of intravenous contrast. Multiplanar reconstructed images and MIPs were obtained and reviewed to evaluate the vascular anatomy.  CONTRAST:  133m OMNIPAQUE IOHEXOL 350 MG/ML SOLN  COMPARISON:  10/10/2012  FINDINGS: CTA CHEST FINDINGS  Lungs are well aerated bilaterally. Biapical scarring is again seen and stable. No focal parenchymal infiltrate or nodule is identified. The  thoracic aorta and its branches are within normal limits. No evidence of dissection or aneurysmal dilatation is seen. Coronary calcifications are seen with evidence of coronary stent placement. The pulmonary artery is well visualized and demonstrates a normal branching pattern. No findings to suggest pulmonary emboli are identified. No significant hilar or mediastinal adenopathy is seen.  Review of the MIP images confirms the above findings.  CTA ABDOMEN AND PELVIS FINDINGS  Nonvascular: The liver, gallbladder, spleen and pancreas are all normal in their CT appearance. The adrenal glands demonstrate some mild hypertrophy is well as calcification in the right adrenal gland although no focal mass lesion is seen. The kidneys demonstrate a normal enhancement pattern with the exception of some mild areas of cortical thinning in the upper pole of the right kidney likely related to scarring.  The appendix is well visualized and within normal limits. Scattered areas of diverticulosis are seen without evidence of diverticulitis. The bladder is well distended. No pelvic mass lesion or sidewall adenopathy is noted. No significant retroperitoneal adenopathy is noted. 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 No acute bony abnormality is seen.  Vascular: The abdominal aorta demonstrates some mild atherosclerotic calcifications although tapers in a normal fashion without aneurysmal dilatation or focal dissection. The iliac arteries and femoral arteries are within normal limits.  The celiac axis, superior mesenteric artery and inferior mesenteric artery are all widely patent. Single renal artery is identified on the right. On the left there are 2 main renal arteries with a small accessory renal artery arising from the proximal portion of the more superior main left renal artery. No significant focal stenoses are identified.  Review of the MIP images confirms the above findings.   IMPRESSION: CTA of the chest: No evidence of aortic dissection or aneurysmal dilatation.  No evidence of pulmonary emboli.  CTA of the abdomen and pelvis:  No acute abnormality is noted.   Electronically Signed   By: MInez CatalinaM.D.   On: 11/29/2014 18:13   Ct Cta Abd/pel W/cm &/or W/o Cm  11/29/2014   CLINICAL DATA:  Chest pain, history of prior cardiac stents  EXAM: CT ANGIOGRAPHY CHEST, ABDOMEN AND PELVIS  TECHNIQUE: Multidetector CT imaging through the chest, abdomen and pelvis was performed using the standard protocol during bolus administration of intravenous contrast. Multiplanar reconstructed images and MIPs were obtained and reviewed to evaluate the vascular anatomy.  CONTRAST:  1022mOMNIPAQUE IOHEXOL 350 MG/ML SOLN  COMPARISON:  10/10/2012  FINDINGS: CTA CHEST FINDINGS  Lungs are well aerated bilaterally. Biapical scarring is again seen and stable. No focal parenchymal infiltrate or nodule is identified. The thoracic aorta and its branches are within normal limits. No evidence of dissection or aneurysmal dilatation is seen. Coronary calcifications are seen with evidence of coronary stent placement. The pulmonary artery  is well visualized and demonstrates a normal branching pattern. No findings to suggest pulmonary emboli are identified. No significant hilar or mediastinal adenopathy is seen.  Review of the MIP images confirms the above findings.  CTA ABDOMEN AND PELVIS FINDINGS  Nonvascular: The liver, gallbladder, spleen and pancreas are all normal in their CT appearance. The adrenal glands demonstrate some mild hypertrophy is well as calcification in the right adrenal gland although no focal mass lesion is seen. The kidneys demonstrate a normal enhancement pattern with the exception of some mild areas of cortical thinning in the upper pole of the right kidney likely related to scarring.  The appendix is well visualized and within normal limits. Scattered areas of diverticulosis are seen without  evidence of diverticulitis. The bladder is well distended. No pelvic mass lesion or sidewall adenopathy is noted. No significant retroperitoneal adenopathy is noted. 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 No acute bony abnormality is seen.  Vascular: The abdominal aorta demonstrates some mild atherosclerotic calcifications although tapers in a normal fashion without aneurysmal dilatation or focal dissection. The iliac arteries and femoral arteries are within normal limits.  The celiac axis, superior mesenteric artery and inferior mesenteric artery are all widely patent. Single renal artery is identified on the right. On the left there are 2 main renal arteries with a small accessory renal artery arising from the proximal portion of the more superior main left renal artery. No significant focal stenoses are identified.  Review of the MIP images confirms the above findings.  IMPRESSION: CTA of the chest: No evidence of aortic dissection or aneurysmal dilatation.  No evidence of pulmonary emboli.  CTA of the abdomen and pelvis:  No acute abnormality is noted.   Electronically Signed   By: Inez Catalina M.D.   On: 11/29/2014 18:13    Medications:  Scheduled: . antiseptic oral rinse  7 mL Mouth Rinse q12n4p  . aspirin EC  81 mg Oral Daily  . chlorhexidine  15 mL Mouth Rinse BID  . clopidogrel  75 mg Oral Daily  . enoxaparin (LOVENOX) injection  40 mg Subcutaneous Q24H  . ezetimibe  10 mg Oral Daily  . isosorbide mononitrate  30 mg Oral Daily  . levothyroxine  100 mcg Oral QAC breakfast  . metoprolol tartrate  12.5 mg Oral BID  . pantoprazole  40 mg Oral BH-q7a  . traZODone  200 mg Oral QHS   Echo: EF 55-60% with no PFO  Carotid doppler pending  Etta Quill PA-C [facility] [phone]  12/01/2014, 9:44 AM  Patient seen and examined.  Clinical course and management discussed.  Necessary edits performed.  I agree with the above.   Assessment and plan of care developed and discussed below.     Assessment/Plan: Patient feels that he is doing better.  No further extremity weakness noted.  Continued left facial droop.  Was independent with gait when working with PT.  MRI of the brain personally reviewed and shows no acute changes.  Echocardiogram unremarkable.  CD pending.   Recommendations: 1.  Agree with anticoagulation 2.  Will follow up CD results.   Alexis Goodell, MD [facility] [phone]  12/01/2014  10:37 AM

## 2014-12-01 NOTE — Progress Notes (Signed)
Patient Name: Bobby Floyd Date of Encounter: 12/01/2014  Principal Problem:   Stroke Active Problems:   History of Tonsillar cancer, stage IV squamous cell left tonsil   Hypothyroidism   Unstable angina   Hypertensive urgency   Primary Cardiologist: Dr. Aundra Dubin Patient Profile: 54yo male w/ PMH of CAD (most recent 11/2013 - DES to mid-circumflex, DES to LAD), HTN, HLD, tonsillar cancer who presented to [facility] on 11/29/2014 for chest pain, headache, left facial droop, and left-sided weakness but MRI/CT negative for acute CVA.  SUBJECTIVE:  Patient reports of high BP for the past 2 weeks. He also feeling L sided achy chest pain with palpitations. Currently feeling better, mild L sided chest discomfort. Patient had recently stent placement to circumflex artery 06/07/14 at [place].   OBJECTIVE Filed Vitals:   11/30/14 2109 12/01/14 0100 12/01/14 0532 12/01/14 1031  BP: 136/83 104/49 113/64 163/103  Pulse: 64 83 63 75  Temp: 98 F (36.7 C) 97.4 F (36.3 C) 97.4 F (36.3 C) 98.2 F (36.8 C)  TempSrc: Oral Oral Oral Oral  Resp: '18 18 17 18  '$ Height:      Weight:      SpO2: 100% 97% 100%     Intake/Output Summary (Last 24 hours) at 12/01/14 1103 Last data filed at 11/30/14 1200  Gross per 24 hour  Intake    240 ml  Output      0 ml  Net    240 ml   Filed Weights   11/29/14 2128  Weight: 175 lb 14.8 oz (79.8 kg)    PHYSICAL EXAM General: Well developed, well nourished, male in no acute distress. Head: Normocephalic, atraumatic.  Neck: supple/normal carotid upstroke bilaterally; no bruits; no JVD; no thyromegaly Lungs:  Resp regular and unlabored, CTA.  Heart: RRR, S1, S2, no S3, S4, or murmur; no rub. Abdomen: Soft, non-tender, non-distended with normoactive bowel sounds. No hepatomegaly. No rebound/guarding. No obvious abdominal masses. Extremities: No clubbing, cyanosis, No edema. Distal pedal pulses are 2+ bilaterally. Neuro: Alert and oriented X 3.  Moves all extremities spontaneously. Psych: Normal affect. LABS: CBC: Recent Labs  11/29/14 1723  11/30/14 0513 12/01/14 0538  WBC 11.9*  < > 6.3 5.6  NEUTROABS 9.9*  --   --   --   HGB 17.4*  < > 16.3 16.1  HCT 49.0  < > 46.2 46.6  MCV 92.8  < > 94.1 94.0  PLT 130*  < > 120* 130*  < > = values in this interval not displayed. INR: Recent Labs  11/29/14 1723  INR 4.65   Basic Metabolic Panel: Recent Labs  11/30/14 0513 12/01/14 0538  NA 139 136  K 3.8 3.7  CL 108 105  CO2 22 21*  GLUCOSE 76 89  BUN 18 13  CREATININE 0.97 0.95  CALCIUM 9.1 8.9   Liver Function Tests: Recent Labs  11/29/14 1723 11/30/14 0513  AST 26 20  ALT 14* 13*  ALKPHOS 69 57  BILITOT 1.3* 1.6*  PROT 6.7 5.7*  ALBUMIN 4.6 3.9   Cardiac Enzymes: Recent Labs  11/29/14 2341 11/30/14 0513 11/30/14 1021  TROPONINI <0.03 <0.03 <0.03    Recent Labs  11/29/14 1728  TROPIPOC 0.00   BNP: No results found for: BNP PRO B NATRIURETIC PEPTIDE (BNP)  Date/Time Value Ref Range Status  06/24/2011 01:00 PM 47.8 0 - 125 pg/mL Final   Hemoglobin A1C: Recent Labs  11/30/14 0513  HGBA1C 5.7*   Fasting Lipid  Panel: Recent Labs  11/30/14 0513  CHOL 235*  HDL 43  LDLCALC 177*  TRIG 77  CHOLHDL 5.5   TELEMETRY:   ECHO: 11/30/2014 Study Conclusions - Left ventricle: The cavity size was normal. There was moderate concentric hypertrophy. Systolic function was normal. The estimated ejection fraction was in the range of 55% to 60%. Wall motion was normal; there were no regional wall motion abnormalities. - Aortic valve: Trileaflet; normal thickness, mildly calcified leaflets. - Mitral valve: Calcified annulus.  Radiology/Studies: Ct Head Wo Contrast: 11/29/2014   CLINICAL DATA:  Left-sided facial droop with left arm weakness and slurred speech  EXAM: CT HEAD WITHOUT CONTRAST  TECHNIQUE: Contiguous axial images were obtained from the base of the skull through the vertex  without intravenous contrast.  COMPARISON:  10/01/2011  FINDINGS: Bony calvarium is intact. No gross soft tissue abnormality is noted. No findings to suggest acute hemorrhage, acute infarction or space-occupying mass lesion are seen.  IMPRESSION: No acute intracranial abnormality noted.  These results were called by telephone at the time of interpretation on 11/29/2014 at 5:57 pm to Dr. Doy Mince, who verbally acknowledged these results.   Electronically Signed   By: Inez Catalina M.D.   On: 11/29/2014 17:57   Mr Brain Wo Contrast: 11/30/2014   CLINICAL DATA:  Initial evaluation for acute left facial droop and left-sided weakness. Evaluate for stroke.  EXAM: MRI HEAD WITHOUT CONTRAST  MRA HEAD WITHOUT CONTRAST  TECHNIQUE: Multiplanar, multiecho pulse sequences of the brain and surrounding structures were obtained without intravenous contrast. Angiographic images of the head were obtained using MRA technique without contrast.  COMPARISON:  Prior CT from 11/29/2014.  FINDINGS: MRI HEAD FINDINGS  Cerebral volume within normal limits for patient age. No significant white matter disease. Possible tiny remote right cerebellar infarct noted.  No abnormal foci of restricted diffusion to suggest acute intracranial infarct. Gray-white matter differentiation maintained. Normal intravascular flow voids are preserved. No acute intracranial hemorrhage. Single punctate chronic microhemorrhage noted within the cortical gray matter of the anterior left frontal lobe (series 9, image 18). This is of doubtful clinical significance.  No mass lesion, midline shift, or mass effect. No hydrocephalus. No extra-axial fluid collection. Possible small arachnoid cyst at the right cerebral convexity near the vertex (series 11, image 15).  Craniocervical junction within normal limits. Pituitary gland normal.  No acute abnormality about the orbits.  Small retention cysts within the right sphenoid sinus. Scattered mucosal thickening within the  ethmoidal air cells. Small right mastoid effusion noted. Inner ear structures grossly normal.  Bone marrow signal intensity within normal limits. No scalp soft tissue abnormality.  MRA HEAD FINDINGS  ANTERIOR CIRCULATION:  Visualized distal cervical segments of the internal carotid arteries are widely patent with antegrade flow. The petrous, cavernous, and supraclinoid segments are widely patent. A1 segments, anterior communicating artery common anterior cerebral arteries well opacified. M1 segments widely patent without stenosis or occlusion. MCA bifurcations within normal limits. Distal MCA branches well opacified bilaterally.  POSTERIOR CIRCULATION:  Vertebral arteries are widely patent to the vertebrobasilar junction. Posterior inferior cerebellar arteries patent bilaterally. Basilar artery widely patent. Superior cerebellar arteries well opacified. Right P1 and P2 segment widely patent. Fetal origin of the left PCA with widely patent left posterior communicating artery.  Small 2 mm focal outpouching arising from the lateral aspect of the cavernous right ICA, suspicious for possible small aneurysm (series 6, image 85).  IMPRESSION: MRI HEAD IMPRESSION:  Negative brain MRI with no acute intracranial infarct or other  process identified.  MRA HEAD IMPRESSION:  1. No large or proximal arterial branch occlusion within the intracranial circulation. No significant intracranial atheromatous disease or focal stenosis. 2. Question 2 mm focal outpouching arising from the cavernous right ICA, which may reflect a small aneurysm. This projects laterally and slightly posteriorly. Correlation with CTA of the head may be helpful for corroboration of this finding as clinically desired.   Electronically Signed   By: Jeannine Boga M.D.   On: 11/30/2014 02:54   Current Medications:  . antiseptic oral rinse  7 mL Mouth Rinse q12n4p  . aspirin EC  81 mg Oral Daily  . chlorhexidine  15 mL Mouth Rinse BID  . clopidogrel  75  mg Oral Daily  . enoxaparin (LOVENOX) injection  40 mg Subcutaneous Q24H  . ezetimibe  10 mg Oral Daily  . isosorbide mononitrate  30 mg Oral Daily  . levothyroxine  100 mcg Oral QAC breakfast  . metoprolol tartrate  12.5 mg Oral BID  . pantoprazole  40 mg Oral BH-q7a  . traZODone  200 mg Oral QHS      ASSESSMENT AND PLAN:  1. Chest pain - history of CAD with interventions being in  11/2013 - DES to mid-circumflex, DES to LAD. Last stent placement to circumflex 06/07/14.  - appears to have chronic chest pain and his symptoms do not appear to have changed.  - No evidence of ischemia on EKG.  Trop x 3 negative.  - Once he completes evaluation for possible CVA, consider  nuclear study for risk stratification.  2. Possible TIA - MRI of brain and CT negative for acute infarct but shows a possible right ICA 47m outpouching.  -Per neurology, ok to start anticoagulation.  - Pending carotid doppler.    3. Paroxysmal atrial fibrillation - noted on telemetry on 11/30/2014.  - CHADSvasc 4 (if pt has truly had CVA or TIA). Would benefit from anticoagulation long-term.  - PendingTSH.  - Continue metoprolol 12.'5mg'$  BID for rate control. Currently in sinus on tele at rate of 50s. Follow closely. - Given 6 months post recent stent, discontinue ASA. Continue plavix and start Eliquis.  - Echo LV EF of 55-60%, no WM abnormality.   4. Substance abuse - Further evaluation and management per primary care.  5. HLD -continue statin. - LDL 177, HDL 43  6. HTN - fluctuating low to high.  -continue present medications and follow.  Bhagat,Bhavinkumar, PAC  I have personally seen and examined this patient with B Bhagat, PA-C. I agree with the assessment and plan as outlined above. He has no evidence of acute coronary syndrome. Atrial fib noted yesterday. Long term anti-coagulation is recommended. Would stop ASA and continue Plavix along with Eliquis given stent in march 2016. OK to discharge from  cardiac standpoint. Follow up with Dr. MAundra Dubinin our office.   MCALHANY,CHRISTOPHER 12/01/2014 1:54 PM

## 2014-12-01 NOTE — Discharge Summary (Addendum)
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 directed      Increase activity slowly    Complete by:  As directed            Follow-up Information    Schedule an appointment as soon as possible for a visit with Maris Berger, MD.   Specialty:  Family Medicine   Contact information:   [address] [place] [address] [phone]       Follow up with [name], MD. Schedule an appointment as soon as possible for a visit in 2 weeks.   Specialty:  Cardiology   Contact information:   [address] [place] [address] [phone]       Total Time spent on discharge equals  45 minutes.  SignedOren Binet 12/01/2014 3:03 PM

## 2014-12-01 NOTE — Progress Notes (Signed)
VASCULAR LAB PRELIMINARY  PRELIMINARY  PRELIMINARY  PRELIMINARY  Carotid duplex completed.    Preliminary report:  Bilateral:  1-39% ICA stenosis.  Vertebral artery flow is antegrade.      Cestone,Helene, RVT 12/01/2014, 11:02 AM

## 2014-12-01 NOTE — Progress Notes (Signed)
Pt discharging at this time taking all personal belongings. Picked up medications from pharmacy he brought from home. Pt transported home by his friend he said was a judge.  IV discontinued, dry dressing applied. Discharge instructions and prescriptions provided with verbal understanding. Pt aware of follow up appt. No noted distress. He denies pain or discomfort.
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
## 2014-12-01 NOTE — Progress Notes (Signed)
Occupational Therapy Treatment Patient Details Name: Bobby Floyd MRN: [id number] DOB: November 18, 1960 Today's Date: 12/01/2014    History of present illness Pt admitted with L side weakness, facial drooping and fall with uncontrolled BP x 2 weeks, MRI negative for stroke.  Pt with hx of CAD with stenting, HLD, chronic pain, anxiety, tonsillar carcinoma.   OT comments  Pt performed ADL transfers and standing ADL at a modified independent to independent level.  Pt is eager to go home.  RN aware that pt does not have transportation.  Follow Up Recommendations  No OT follow up    Equipment Recommendations  None recommended by OT    Recommendations for Other Services      Precautions / Restrictions Precautions Precautions: None Restrictions Weight Bearing Restrictions: No       Mobility Bed Mobility Overal bed mobility: Modified Independent                Transfers Overall transfer level: Modified independent Equipment used: None             General transfer comment: simulated tub transfer    Balance Overall balance assessment: Independent                                 ADL       Grooming: Wash/dry hands;Standing;Independent               Lower Body Dressing: Independent;Sit to/from stand   Toilet Transfer: Independent;Ambulation Toilet Transfer Details (indicate cue type and reason): stood to urinate                  Vision                     Perception     Praxis      Cognition   Behavior During Therapy: [facility] for tasks assessed/performed Overall Cognitive Status: Within Functional Limits for tasks assessed                       Extremity/Trunk Assessment               Exercises     Shoulder Instructions       General Comments      Pertinent Vitals/ Pain       Pain Assessment: Faces Faces Pain Scale: Hurts even more Pain Location: head, chest Pain Descriptors / Indicators:  Aching Pain Intervention(s): Premedicated before session;Repositioned;Limited activity within patient's tolerance  Home Living                                          Prior Functioning/Environment              Frequency       Progress Toward Goals  OT Goals(current goals can now be found in the care plan section)  Progress towards OT goals: Progressing toward goals  Acute Rehab OT Goals Patient Stated Goal: return home  Plan Discharge plan remains appropriate    Co-evaluation                 End of Session     Activity Tolerance Patient tolerated treatment well   Patient Left in bed;with call bell/phone within reach;with bed alarm set   Nurse Communication  (wants to shower)  Time: 3074-6002 OT Time Calculation (min): 19 min  Charges: OT General Charges $OT Visit: 1 Procedure OT Treatments $Self Care/Home Management : 8-22 mins  Malka So 12/01/2014, 1:58 PM [phone]

## 2014-12-03 ENCOUNTER — Other Ambulatory Visit: Payer: Self-pay | Admitting: *Deleted

## 2014-12-03 DIAGNOSIS — G459 Transient cerebral ischemic attack, unspecified: Secondary | ICD-10-CM

## 2014-12-03 DIAGNOSIS — I4891 Unspecified atrial fibrillation: Secondary | ICD-10-CM

## 2014-12-03 NOTE — Patient Outreach (Signed)
[facility] ([facility]) Care Management  12/03/2014  Bobby Floyd 07/29/1960 [id number]   Transition of care call [name] was discharged from [facility] on 9/21, DX TIA,Atrial Fibrillation.Patient states that he is doing "better on today, and has the best blood pressure that he has had in a while" 110/70 on today. Patient denies headache, dizziness or increased weakness  [name] reports that he visited Dr.Whyte office on yesterday and has another office visit on October 7. Encouraged [name] regarding making a follow up appointment as soon as possible to see Dr.Crenshaw in the next 2 weeks [name] reports that he has all of the medication that was prescribed at discharge and taking it as ordered. Patient expressed concern regarding being able to afford Eliquis, his co-pay is $47, he did get the 30 day free card to get first prescription, is also stated that the co-pay for his new prescription for zetia was also over $40. [name] states that his friend will be able to  provide some transportation for some appointments, [name] states at discharge he was told not to drive for 3 months , encouraged patient to adhere to instructions for safety.   Plan Encouraged patient regarding importance of follow with her primary MD and cardiology appointments Patient will continue to check his blood pressure daily and record and notify Dr.Whyte if  continued increases in blood pressure noted or any new symptoms. Will place pharmacist referral regarding patient concern for medication cost. Will continue transition of care calls  [facility] CM Care Plan Problem One        Most Recent Value   Care Plan Problem One  Falls Frequently   Role Documenting the Problem One  Care Management Coordinator   Care Plan for Problem One  Active   [facility] Long Term Goal (31-90 days)  Patient will report decrease in falls in the next 60 days   [facility] Long Term Goal Start Date  10/01/14   Interventions  for Problem One Long Term Goal  Instructed  regarding the importance of following safety recommendations regarding driving, and using safety devices, allowing friends to assist ,notify RN CM if transportation needs    [facility] CM Short Term Goal #1 (0-30 days)  Pt will report increased use of  his cane when walking in the next 30 days    [facility] CM Short Term Goal #1 Start Date  07/26/14   [facility] CM Short Term Goal #1 Met Date  10/01/14   [facility] CM Short Term Goal #2 (0-30 days)  Patient will report a structured walking program at least 3 days a week for the next 30 days   [facility] CM Short Term Goal #2 Start Date  10/01/14   [facility] CM Short Term Goal #2 Met Date  11/01/14    [facility] CM Care Plan Problem Two        Most Recent Value   Care Plan Problem Two  Blood pressure Monitor Education   Role Documenting the Problem Two  Care Management Coordinator   Care Plan for Problem Two  Active   [facility] Long Term Goal (31-90) days  Patient will demonstrate proper use of Blood Pressure Monitor in the next 31 days   [facility] Long Term Goal Start Date  11/01/14   [facility] Long Term Goal Met Date  11/19/14   [facility] CM Short Term Goal #1 (0-30 days)  Patient will be able to return demonstrate proper use of blood pressure machine   [facility] CM Short Term Goal #1 Start   Date  11/01/14   [facility] CM Short Term Goal #1 Met Date   11/19/14   [facility] CM Short Term Goal #2 (0-30 days)  Patient will check blood pressure at  least 3 days a week   [facility] CM Short Term Goal #2 Start Date  11/01/14   [facility] CM Short Term Goal #2 Met Date  11/19/14   [facility] CM Short Term Goal #3 (0-30 days)  Patient will report elevated blood pressure to primary MD   [facility] CM Short Term Goal #3 Start Date  12/03/14   Interventions for Short Term Goal #3  Discussed the importance with patient to keep a record of daily blood pressures and report increases     [facility] CM Care Plan Problem Three        Most Recent Value   Care Plan Problem Three  Weight Losss related to Nutrition concerns   Role  Documenting the Problem Three  Care Management Coordinator   Care Plan for Problem Three  Active   [facility] Long Term Goal (31-90) days  Patient will be able to report eating a balanced diet    [facility] Long Term Goal Start Date  11/01/14   Interventions for Problem Three Long Term Goal  reviewed labels for sodium content, protein and nutrients, discussed his usual diet , encouraged to try and eat small frequent meals.    [facility] CM Short Term Goal #1 (0-30 days)  Patient will be able to choose soft foods that he is able to tolerate for  for a well balanced diet   [facility] CM Short Term Goal #1 Start Date  11/01/14   [facility] CM Short Term Goal #1 Met Date  11/19/14   [facility] CM Short Term Goal #2 (0-30 days)  Patient will verbalize increased knowledge of Diagnosis TIA   [facility] CM Short Term Goal #2 Start Date  12/03/14   Interventions for Short Term Goal #2  Instructed on symptoms of stroke, TIA, and to notify MD or 911 for changes      Joylene Draft, [place], [place] Care Management [phone]

## 2014-12-03 NOTE — Addendum Note (Signed)
Addended by: Joylene Draft A on: 12/03/2014 09:27 PM   Modules accepted: Medications

## 2014-12-06 ENCOUNTER — Other Ambulatory Visit: Payer: Self-pay

## 2014-12-06 ENCOUNTER — Other Ambulatory Visit: Payer: Self-pay | Admitting: Physician Assistant

## 2014-12-06 NOTE — Patient Outreach (Signed)
[place] [facility] [facility]) Care Management  12/06/2014  BAYLEY HURN 27-Jun-1960 [id number]   Request from Joylene Draft, RN to assign Pharmacy, assigned Deanne Coffer, PharmD.  Thanks, [name]. [place], [place] Assistant Phone: [phone] Fax: [phone]

## 2014-12-06 NOTE — Patient Outreach (Signed)
Bobby Floyd was referred to me by Landis Martins, RN, his nurse care manager in regards to assisting with affording his Eliquis and Zetia.  Bobby Floyd was recently hospitalized for a CVA due to possible A. Fib.  He currently has Eliquis but not Zetia due to the cost.  Both will cost around $47.  He has partial Extra Help where he only has to pay up to 15% of the medications.  Even with this, he is still paying the full copay cost for Eliquis and Zetia.  He stated he will not be able to pay for them because it will take money out of his grocery money.  I researched patient assistance programs and determined he may be able to qualify for Eliquis and Zetia through them.  He will need to get a printout of how much he has spent towards medications from his pharmacy.  Neither program stated that if he had Extra Help he did not qualify.  He stated he would get the printout from the pharmacy and fax it to [place].  I will have Damita Rhodie, care management assistant, reach out to him to help with the paperwork.  I will follow up in a few weeks to determine if he qualifies or not.    Deanne Coffer, PharmD, [place] [phone]

## 2014-12-08 ENCOUNTER — Other Ambulatory Visit: Payer: Self-pay | Admitting: *Deleted

## 2014-12-08 NOTE — Patient Outreach (Signed)
[place] [facility]) Care Management  12/08/2014  Bobby Floyd [birth year] [id number]   Transition of care call Subjective: Bobby Floyd reports that he is doing well. Denies dizziness, falls, or elevated blood pressure and chest pain. Patient reports that he is taking his medication as ordered. I discussed the importance of follow up with Dr.Whyte, PCP  at his October 7 appointment, as well as scheduling a follow up appointment with Cardiologist Dr.McLean as per discharge  hospital instructions due to his diagnosis of Atrial Fibrillation. Bobby Floyd voiced understanding but concerned about the cost of visiting a specialist with a co payment of $45 dollars, but stated he would make the appointment. Bobby Floyd stated "you'll be surprised what I have done" she stated that he has quit smoking, its been about 4 days states that he is using nicorette gum, states that his nerves are holding up okay.  Assessment: Patient with concerns related to cost of co payments Smoking cessation : I offered pharmacy assistance regarding smoking cessation, Bobby Floyd states that he has been to all sort of classes including hypnosis to help him with quitting smoking.   Plan: Will continue transition of care calls Will send Nancie Neas an inbasket message to see if there are resources to assist with co-payments Continue to encourage Bobby Floyd regarding progress with smoking cessation. Bobby Floyd will be able to state action to take if he notices signs & symptoms of stroke.  [facility] CM Care Plan Problem One        Most Recent Value   Care Plan Problem One  Falls Frequently   Role Documenting the Problem One  Care Management [place] for Problem One  Active   [facility] Long Term Goal (31-90 days)  Patient will report decrease in falls in the next 60 days   [facility] Long Term Goal Start Date  10/01/14   Interventions for Problem One Long Term Goal  Instructed  regarding the importance of  following safety recommendations regarding driving, and using safety devices, allowing friends to assist ,notify RN CM if transportation needs    [facility] CM Short Term Goal #1 (0-30 days)  Pt will report increased use of  his cane when walking in the next 30 days    [facility] CM Short Term Goal #1 Start Date  07/26/14   [facility] CM Short Term Goal #1 Met Date  10/01/14   [facility] CM Short Term Goal #2 (0-30 days)  Patient will report a structured walking program at least 3 days a week for the next 30 days   [facility] CM Short Term Goal #2 Start Date  10/01/14   [facility] CM Short Term Goal #2 Met Date  11/01/14    [facility] CM Care Plan Problem Two        Most Recent Value   Care Plan Problem Two  Blood pressure Monitor Education   Role Documenting the Problem Two  Care Management [place] for Problem Two  Active   [facility] Long Term Goal (31-90) days  Patient will demonstrate proper use of Blood Pressure Monitor in the next 31 days   [facility] Long Term Goal Start Date  11/01/14   [facility] Long Term Goal Met Date  11/19/14   [facility] CM Short Term Goal #1 (0-30 days)  Patient will be able to return demonstrate proper use of blood pressure machine   [facility] CM Short Term Goal #1 Start Date  11/01/14   [facility] CM Short Term Goal #1 Met Date  11/19/14   [facility] CM Short Term Goal #2 (0-30 days)  Patient will check blood pressure at  least 3 days a week   [facility] CM Short Term Goal #2 Start Date  11/01/14   [facility] CM Short Term Goal #2 Met Date  11/19/14   [facility] CM Short Term Goal #3 (0-30 days)  Patient will report elevated blood pressure to primary MD   [facility] CM Short Term Goal #3 Start Date  12/03/14   [facility] CM Short Term Goal #3 Met Date  12/08/14    [facility] CM Care Plan Problem Three        Most Recent Value   Care Plan Problem Three  Weight Losss related to Nutrition concerns   Role Documenting the Problem Three  Care Management Coordinator   Care Plan for Problem Three  Active   [facility] Long Term Goal (31-90) days  Patient will be able to report eating a  balanced diet    [facility] Long Term Goal Start Date  11/01/14   [facility] Long Term Goal Met Date  12/08/14   [facility] CM Short Term Goal #1 (0-30 days)  Patient will be able to choose soft foods that he is able to tolerate for  for a well balanced diet   [facility] CM Short Term Goal #1 Start Date  11/01/14   [facility] CM Short Term Goal #1 Met Date  11/19/14   [facility] CM Short Term Goal #2 (0-30 days)  Patient will verbalize increased knowledge of Diagnosis TIA   [facility] CM Short Term Goal #2 Start Date  12/03/14   Interventions for Short Term Goal #2  Instructed on symptoms of stroke, TIA, and to notify MD or 911 for changes      Joylene Draft, [place], [place] Care Management [phone]

## 2014-12-09 NOTE — Patient Outreach (Signed)
[place] [facility]) Care Management  12/09/2014  BRANDYN LOWREY 07/30/1960 [id number]   Request received from Deanne Coffer, PharmD, to outreach patient and assist in completing patient assistance applications.Telephone outreach to patient and we completed the patient assistance applications for [employer] and [employer]. Patient will be receiving copies of application in the mail for signatures and will mail back to me to be sent off to companies for processing. I will follow up with patient in about 3 weeks.  Damita L. Rhodie, [place] Care Management Assistant

## 2014-12-13 ENCOUNTER — Other Ambulatory Visit: Payer: Self-pay | Admitting: *Deleted

## 2014-12-13 NOTE — Patient Outreach (Signed)
[facility]) Care Management  12/13/2014  Bobby Floyd December 06, 1960 [id number]  Transition of care Subjective: Patient states " I am behaving" Assessment: Falls: Denies having a fall within last week Recent TIA: Denies further symptoms of dizziness, increased weakness, reports that he is doing well with not smoking using the nicorette gum. He continues to check his blood pressure daily and writes it on his calendar. Patient reports his blood pressure today is 120/81 and highest reading in the last 5 days has been 144/101. Discussed with patient the importance of notifying MD of new symptoms and consistent elevated blood pressures. Medications: Patient reports taking her medication as prescribed, he voiced concern that he got all of his medications prescriptions filled today except for the xanax, in which he states that [place] has sent Dr.Whytes office a message regarding needing prescription refilled. Instructed patient to notify me if he needed assistance with making contact with his Primary MD. Mr.Hisaw reports that he is feeling some better emotionally on today, and that he does still receives calls from [facility] states that he was able to talk with them on last week.  Discussed with Mr.Magid regarding the importance of follow up with cardiology post hospitalization related to his cardiac conditions, patient states he understood and just forgot and will make the appointment tomorrow, declined assistance with making appointment.  Plan: Will continue transition of care calls Plan home visit on October 21. Patient will follow through on making his cardiac follow up appointment Mr.Ursin will continue with smoking cessation  THN CM Care Plan Problem One        Most Recent Value   Care Plan Problem One  Falls Frequently   Role Documenting the Problem One  Care Management [place] for Problem One  Active   THN Long Term Goal (31-90  days)  Patient will report decrease in falls in the next 60 days   [facility]N Long Term Goal Start Date  10/01/14   Interventions for Problem One Long Term Goal  Instructed  regarding the importance of following safety recommendations regarding driving, and using safety devices, allowing friends to assist ,notify RN CM if transportation needs    [facility] CM Short Term Goal #1 (0-30 days)  Pt will report increased use of  his cane when walking in the next 30 days    [facility]N CM Short Term Goal #1 Start Date  07/26/14   [facility] CM Short Term Goal #1 Met Date  10/01/14   [facility] CM Short Term Goal #2 (0-30 days)  Patient will report a structured walking program at least 3 days a week for the next 30 days   [facility]N CM Short Term Goal #2 Start Date  10/01/14   [facility] CM Short Term Goal #2 Met Date  11/01/14    [facility] CM Care Plan Problem Two        Most Recent Value   Care Plan Problem Two  Blood pressure Monitor Education   Role Documenting the Problem Two  Care Management [place] for Problem Two  Active   THN Long Term Goal (31-90) days  Patient will demonstrate proper use of Blood Pressure Monitor in the next 31 days   THN Long Term Goal Start Date  11/01/14   [facility] Long Term Goal Met Date  11/19/14   THN CM Short Term Goal #1 (0-30 days)  Patient will be able to return demonstrate proper use of blood pressure machine  THN CM Short Term Goal #1 Start Date  11/01/14   [facility] CM Short Term Goal #1 Met Date   11/19/14   [facility]N CM Short Term Goal #2 (0-30 days)  Patient will check blood pressure at  least 3 days a week   THN CM Short Term Goal #2 Start Date  11/01/14   [facility] CM Short Term Goal #2 Met Date  11/19/14   [facility]N CM Short Term Goal #3 (0-30 days)  Patient will report elevated blood pressure to primary MD   THN CM Short Term Goal #3 Start Date  12/03/14   [facility] CM Short Term Goal #3 Met Date  12/08/14   [facility]N CM Short Term Goal #4 (0-30 days)  Patient will verbalize increased knowledge of TIA, Stroke symptoms   THN CM  Short Term Goal #4 Start Date  12/03/14   Interventions of Short Term Goal #4  Reviewed signs and symptoms of CVA, action plan to take    [facility] CM Care Plan Problem Three        Most Recent Value   Care Plan Problem Three  Weight [place] related to Nutrition concerns   Role Documenting the Problem Three  Care Management Coordinator   Care Plan for Problem Three  Active   THN Long Term Goal (31-90) days  Patient will be able to report eating a balanced diet    THN Long Term Goal Start Date  11/01/14   THN Long Term Goal Met Date  12/08/14   [facility] CM Short Term Goal #1 (0-30 days)  Patient will be able to choose soft foods that he is able to tolerate for  for a well balanced diet   THN CM Short Term Goal #1 Start Date  11/01/14   [facility] CM Short Term Goal #1 Met Date  11/19/14     Joylene Draft, [place], [place] Care Management [phone]

## 2014-12-13 NOTE — Patient Outreach (Signed)
[facility]) Care Management  12/13/2014  Bobby Floyd [birth year] [id number]   Triggered RED on EMMI Stroke Dashboard, notification sent to Joylene Draft, RN.  Thanks, [name]. [place], [place] Assistant Phone: [phone] Fax: [phone]

## 2014-12-16 ENCOUNTER — Other Ambulatory Visit: Payer: Self-pay | Admitting: *Deleted

## 2014-12-16 ENCOUNTER — Telehealth: Payer: Self-pay | Admitting: Cardiology

## 2014-12-16 NOTE — Patient Outreach (Signed)
[place]) Care Management  12/16/2014  Bobby Floyd November 02, 1960 [id number]   Received in basket message regarding patient being Red on Emmi stroke dashboard.  Telephone call to Bobby Floyd, he states I'm behaving", and doing better after getting my xanax refilled. Bobby Floyd reports that he was out of his Xanax and had difficulty getting it refilled from his primary MD, so he went to [facility] on yesterday to see a Psychiatrist  and they where able to provide  him a refill on the medication as he has been a patient there before, Bobby Floyd states that they gave him a letter to provide to his PCP that stated to avoid the co payment of $45 it would be best to have xanax prescribed by Primary care MD.  Discussed patients the importance of making  a follow up appointment with Dr.Mclean as per DC instructions , Bobby Floyd has been provided with the office number and states that he will make the appointment.    Plan: Will continue to transition of care Encouraged patient to contact me/PCP if concerns.
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
## 2014-12-16 NOTE — Telephone Encounter (Signed)
New problem   Pt stated he had a mini stroke and need to speak to nurse concerning when he need to come in office.

## 2014-12-16 NOTE — Telephone Encounter (Addendum)
Pt states that his BP has been "all over the place" since the beginning of Sept. Pt states that he had a mini stroke on 9/19 and since his d/c his BP has been even higher. Recent BP's include 154/103, 120/88, 144/106, 148/108 on home BP cuff. Pt checked vitals while on the phone and they were 148/101, 92. Pt states that he has a HA, dizziness and "aching" CP on and off. Spoke with Melina Copa, PA and she said if pt having currently having CP , due to his extensive history, he should proceed to ED but if no active symptoms ok to see Flex tomorrow. Spoke with pt and advised him of recommendations per Dayna. Pt preferred to make appt.  Appt scheduled for 12/17/14 at 9:30AM. Advised pt to bring all medications and his home BP cuff with him. Advised pt if CP occurs and he has sharp, shooting pains that he should proceed to ED. Pt states that he doesn't really want to go to ED because he doesn't feel that [facility] is sufficient. Informed pt that he can request to be transferred to [place] verbalized understanding and was in agreement to come in tomorrow for appt.

## 2014-12-16 NOTE — Patient Outreach (Signed)
[facility]) Care Management  12/16/2014  Bobby Floyd [id number] [id number]   RED on EMMI Stroke [place], notification sent to Joylene Draft, RN.  Thanks, [name]. [place], [place] Assistant Phone: [phone] Fax: [phone]

## 2014-12-17 ENCOUNTER — Encounter: Payer: Self-pay | Admitting: Physician Assistant

## 2014-12-17 ENCOUNTER — Ambulatory Visit (INDEPENDENT_AMBULATORY_CARE_PROVIDER_SITE_OTHER): Payer: Medicare HMO | Admitting: Physician Assistant

## 2014-12-17 ENCOUNTER — Ambulatory Visit (INDEPENDENT_AMBULATORY_CARE_PROVIDER_SITE_OTHER): Payer: Medicare HMO

## 2014-12-17 VITALS — BP 98/72 | HR 58 | Ht 74.0 in | Wt 185.8 lb

## 2014-12-17 DIAGNOSIS — I4891 Unspecified atrial fibrillation: Secondary | ICD-10-CM

## 2014-12-17 NOTE — Patient Instructions (Signed)
Medication Instructions:   Your physician recommends that you continue on your current medications as directed. Please refer to the Current Medication list given to you today.  Labwork:  NONE ORDER TODAY   Testing/Procedures:  Your physician has recommended that you wear an event monitor. Event monitors are medical devices that record the heart's electrical activity. Doctors most often [place] these monitors to diagnose arrhythmias. Arrhythmias are problems with the speed or rhythm of the heartbeat. The monitor is a small, portable device. You can wear one while you do your normal daily activities. This is usually used to diagnose what is causing palpitations/syncope (passing out).    Follow-Up:   IN ONE MONTH AFTER EVENT MONITOR  WITH WITH DR [facility] OR AVAILBLE APP   Any Other Special Instructions Will Be Listed Below (If Applicable).

## 2014-12-22 ENCOUNTER — Other Ambulatory Visit: Payer: Self-pay | Admitting: *Deleted

## 2014-12-22 NOTE — Patient Outreach (Signed)
[facility]) Care Management  12/22/2014  Bobby Floyd 1960/11/24 [id number]  Transition of care call Bobby Floyd is today.  Subjective : " I'm good"  Assessment : Blood pressure control : Bobby Floyd reports blood pressure better controlled, reading 120/70 -130/80 and records it daily. Patient reports that he takes his medication daily as prescribed. He reports wearing a holter monitor since his recent visit to cardiologist.  Smoking Cessation: Bobby Floyd reports that he is doing well, "still not smoking"  Recent TIA- Denies any new symptoms or complaints, denies having a recent fall   Plan: Continue transition of care, with a home visit on 10/21. Patient will call RN, or PCP with concerns or 911 for emergency.  Joylene Draft, RN, [place] Management [phone]- Mobile [phone]- Toll Free Main Office

## 2014-12-27 ENCOUNTER — Other Ambulatory Visit: Payer: Self-pay

## 2014-12-27 NOTE — Patient Outreach (Signed)
I called Bobby Floyd to determine where we were with his paperwork to help him get his medications.  He stated he sent in paperwork back in a few weeks ago.  He stated he would be running out of his Eliquis and Zetia soon.  I checked with Damita Rhodie, care management assistance and she stated she just received the paperwork back today.  She also stated he did not return the medication out of pocket spend report she also needs to send in.  She stated she would contact him to make sure all the appropriate paperwork is sent in.  I instructed Damita to determine when he would need his medications refilled and we could determine if he qualifies for assistance through the emergency fund.  She is going to let me know.   Deanne Coffer, PharmD, [place] [phone]

## 2014-12-30 NOTE — Patient Outreach (Signed)
[place] [facility]) Care Management  12/27/2014  Bobby Floyd Jan 31, 1961 [id number]   Telephone outreach to patient informing him I had received his paperwork today and that I was missing the amounts he spent out on his medications this year from his pharmacy. Patient stated he would call his pharmacy and have them fax the information over to me. I provided him our fax number. I also informed him I was still waiting to receive the physician portion of the applications back and I would send a 2nd urgent request to Dr. Selena Batten.  Per Deanne Coffer, PharmD request, I ask him how much of his medication did he have left and he stated through the end of the week. I sent a message Dawn with that information.  Once I receive all the information back, I will send the applications off for processing and will outreach the patient of the status.  Egan Berkheimer L. Caleesi Kohl, [place] Care Management Assistant

## 2014-12-30 NOTE — Patient Outreach (Signed)
[facility]) Care Management  12/30/2014  [name] December 11, 1960 [id number]   Telephone outreach to patient to let him know the status of his patient assistance applications. I had to HIPPA compliant message asking him to call me back.  Patient assistance forms was received from his providers office and I faxed the [employer] patient assistance forms to the company and mailed the patient portion of the [employer] application. The physician portion of the [employer] application must have original signatures and I spoke to [place] today at Dr. Neena Rhymes office and she is going to mail the form.   I will try reaching the patient again tomorrow and follow up in about 2 weeks to check the status of applications submitted.  Marcina Kinnison L. Marquasia Schmieder, [place] Care Management Assistant

## 2014-12-31 ENCOUNTER — Encounter: Payer: Self-pay | Admitting: *Deleted

## 2014-12-31 ENCOUNTER — Other Ambulatory Visit: Payer: Self-pay | Admitting: *Deleted

## 2014-12-31 NOTE — Patient Outreach (Signed)
[place]) Care Management  12/31/2014  JOHNTAVIUS SHEPARD Nov 05, 1960 [id number]   Telephone call from Landis Martins, RN while visiting patient. Patient states he returned my call while I was out of the office. I informed patient the patient assistance application for [facility] was faxed on yesterday and the [employer] application was mailed. I mailed the patient portion of the Mer[employer] application and Dr. Neena Rhymes nurse was mailing the physician portion. I told him we will follow up in about 2 weeks.  Patient also states he was running low on his medication and I told him either Deanne Coffer, PharmD or myself would give him a call with the status of paying for his medication.  Vanisha Whiten L. Carder Yin, [place] Care Management Assistant

## 2014-12-31 NOTE — Patient Outreach (Signed)
[place] [facility]) Care Management  12/31/2014  VERDUN RACKLEY 1960-12-23 [id number]   Telephone outreach to patient letting him know [place] assistance emergency fund program paid for his medication and he could pick them up at [facility] in [place]. Patient was very appreciative of [place].  Fionn Stracke L. Aleenah Homen, [place] Care Management Assistant

## 2014-12-31 NOTE — Patient Outreach (Signed)
[facility]) Care Management   12/31/2014  [name] 02-02-1961 [id number]  Bobby Floyd is an 54 y.o. male   Initial Home during transition care  Subjective: "I'm feeling good, eating good and putting on a little weight". Patient states I'm about to run out of my Eliquis and Zetia.  Objective:   Review of Systems  Constitutional: Negative.   HENT: Negative.   Respiratory: Negative.  Negative for shortness of breath and wheezing.   Cardiovascular: Negative for chest pain and palpitations.  Gastrointestinal: Negative for heartburn.  Musculoskeletal: Positive for falls.  Neurological: Negative for dizziness.  Psychiatric/Behavioral: Negative for depression and suicidal ideas.    Physical Exam  Constitutional: He is oriented to person, place, and time. He appears well-developed and well-nourished.  Cardiovascular: Normal rate and regular rhythm.   Respiratory: No respiratory distress.  Musculoskeletal: He exhibits no edema.  Neurological: He is alert and oriented to person, place, and time.  Skin: Skin is warm and dry.  Band aid to left lower arm x 2 areas,  No,redness noted around area  Psychiatric: He has a normal mood and affect. His behavior is normal. Judgment and thought content normal.    Current Medications:   Current Outpatient Prescriptions  Medication Sig Dispense Refill  . ALPRAZolam (XANAX) 0.5 MG tablet Take 1 mg by mouth 3 (three) times daily as needed for anxiety or sleep (takes every night for sleep and then as needed throughout the day).     Marland Kitchen apixaban (ELIQUIS) 5 MG TABS tablet Take 1 tablet (5 mg total) by mouth 2 (two) times daily. 120 tablet 0  . clopidogrel (PLAVIX) 75 MG tablet TAKE 1 TABLET BY MOUTH ONCE DAILY. 30 tablet 6  . ezetimibe (ZETIA) 10 MG tablet Take 1 tablet (10 mg total) by mouth daily. 30 tablet 0  . HYDROcodone-acetaminophen (NORCO) 10-325 MG per tablet Take 1 tablet by mouth every 6 (six) hours as needed  for moderate pain.    Marland Kitchen levothyroxine (SYNTHROID, LEVOTHROID) 100 MCG tablet Take 100 mcg by mouth daily before breakfast.    . metoprolol tartrate (LOPRESSOR) 25 MG tablet Take 0.5 tablets (12.5 mg total) by mouth 2 (two) times daily. 15 tablet 0  . pantoprazole (PROTONIX) 40 MG tablet Take 40 mg by mouth daily.    . traZODone (DESYREL) 100 MG tablet Take 200 mg by mouth at bedtime.    . isosorbide mononitrate (IMDUR) 30 MG 24 hr tablet Take 30 mg by mouth daily.     No current facility-administered medications for this visit.    Functional Status:   In your present state of health, do you have any difficulty performing the following activities: 12/31/2014 11/29/2014  Hearing? Y N  Vision? N N  Difficulty concentrating or making decisions? [name]  Walking or climbing stairs? Y Y  Dressing or bathing? N N  Doing errands, shopping? N Y  Conservation officer, nature and eating ? N -  Using the Toilet? N -  In the past six months, have you accidently leaked urine? N -  Do you have problems with loss of bowel control? N -  Managing your Medications? N -  Managing your Finances? N -  Housekeeping or managing your Housekeeping? N -    Fall/Depression Screening:    PHQ 2/9 Scores 12/16/2014 11/19/2014 10/01/2014 10/01/2014 07/19/2014  PHQ - 2 Score _0 0 0  PHQ- 9 Score _1 - -    Assessment:   Falls:  [name] reports a fall this week after getting out of his car and stepping up onto the curb to side walk he stumbled falling into bushes and reports torn skin areas on left arm that he has band-aids on,denies dizziness. He did not have his cane.Discussed importance of assistive device in fall prevention. Recent TIA: Denies is new signs or symptoms. Heart rhythm regular on exam, patient reports that he has mailed back the heart monitor that he was wearing for a few weeks to the cardiology office. [name] continues to check his blood pressure daily and record, blood pressure reading have been stable in the  110-120's/80's, he checked his blood pressure with his home monitor and compared to manual check on today, reading correlate.  Smoking Cessation: [name] reports that he is doing well not smoking,using nicorette gum, states that it has been 28 days today without a cigarette.  Medication: Patient reports that he has enough Eliquis and Zetia to last through Sunday, Damita Rhodie, CMA has been working with patient on drug company assistance. We returned a phone to her during the visit, and explain to her his medication need on today,and she made [facility] Pharmacist aware, and she was able to get patient prescription filled through emergency fund, at his local [place] for patient to pick up today. [name] is compliant with taking all of his medication as prescribed.Patient is very appreciative of [facility] assistance.  Plan:  Patient will continue to be successful with smoking cessation We will review patient centered goal of care plan [name] will continue to check and record his blood pressures daily and notify MD of changes, or symptoms Will schedule November home visit. Will send initial visit note to Dr.Whyte  [facility] CM Care Plan Problem One        Most Recent Value   Care Plan Problem One  Falls Frequently   Role Documenting the Problem One  Care Management [place] for Problem One  Active   THN Long Term Goal (31-90 days)  Patient will report decrease in falls in the next 60 days   THN Long Term Goal Start Date  10/01/14   [facility] Long Term Goal Met Date  12/22/14   THN CM Short Term Goal #1 (0-30 days)  Pt will report increased use of  his cane when walking in the next 30 days    THN CM Short Term Goal #1 Start Date  12/31/14 [goal date reevaluated]   THN CM Short Term Goal #1 Met Date  -- [goal date restarted]   Interventions for Short Term Goal #1  Discussed importane of walking aide to decrease fall, encouraged to take it with him when outside    [facility] CM  Short Term Goal #2 (0-30 days)  Patient will report a structured walking program at least 3 days a week for the next 30 days   THN CM Short Term Goal #2 Start Date  10/01/14   [facility] CM Short Term Goal #2 Met Date  11/01/14    [facility] CM Care Plan Problem Two        Most Recent Value   Care Plan Problem Two  Blood pressure Monitor Education   Role Documenting the Problem Two  Care Management Coordinator   Care Plan for Problem Two  Active   THN Long Term Goal (31-90) days  Patient will demonstrate proper use of Blood Pressure Monitor in the next 31 days   THN Long Term Goal Start Date  11/01/14   THN Long Term Goal Met Date  11/19/14   THN CM Short Term Goal #1 (0-30 days)  Patient will be able to return demonstrate proper use of blood pressure machine   THN CM Short Term Goal #1 Start Date  11/01/14   [facility] CM Short Term Goal #1 Met Date   11/19/14   THN CM Short Term Goal #2 (0-30 days)  Patient will check blood pressure at  least 3 days a week   THN CM Short Term Goal #2 Start Date  11/01/14   [facility] CM Short Term Goal #2 Met Date  11/19/14   THN CM Short Term Goal #3 (0-30 days)  Patient will report elevated blood pressure to primary MD   THN CM Short Term Goal #3 Start Date  12/03/14   [facility] CM Short Term Goal #3 Met Date  12/08/14   THN CM Short Term Goal #4 (0-30 days)  Patient will verbalize increased knowledge of TIA, Stroke symptoms   THN CM Short Term Goal #4 Start Date  12/03/14   [facility] CM Short Term Goal #4 Met Date  12/31/14    [facility] CM Care Plan Problem Three        Most Recent Value   Care Plan Problem Three  Weight Losss related to Nutrition concerns   Role Documenting the Problem Three  Care Management Coordinator   Care Plan for Problem Three  Active   THN Long Term Goal (31-90) days  Patient will be able to report eating a balanced diet    THN Long Term Goal Start Date  11/01/14   THN Long Term Goal Met Date  12/08/14   THN CM Short Term Goal #1 (0-30 days)  Patient will be able to  choose soft foods that he is able to tolerate for  for a well balanced diet   THN CM Short Term Goal #1 Start Date  11/01/14   [facility] CM Short Term Goal #1 Met Date  11/19/14     Joylene Draft, RN, [place] Management [phone]- Mobile [phone]- [place]

## 2015-01-03 ENCOUNTER — Encounter: Payer: Self-pay | Admitting: *Deleted

## 2015-01-24 ENCOUNTER — Other Ambulatory Visit: Payer: Self-pay

## 2015-01-24 NOTE — Patient Outreach (Signed)
I called Mr. Brager to discuss the denial from [employer] for his Eliquis.  He stated he does not know what to do.  He know he needs this and he just cannot afford the $47 copay.  I stated I would reach out to [employer] and see what we would need to do to get a Tier exception since this is the only anticoagulant he can be on.  I also stated he would qualify for the Pharmacy Emergency Fund to purchase the Eliquis and Zetia again.  He will refill it on 11/18 and I will purchase it on 11/19.  I will follow up on Friday with Mr. Giovanetti as well.    Deanne Coffer, PharmD, [place] [phone]

## 2015-01-28 ENCOUNTER — Other Ambulatory Visit: Payer: Self-pay

## 2015-01-28 NOTE — Patient Outreach (Signed)
I was able to purchase Bobby Floyd's Eliquis from [employer].  He had already paid $20 towards it as well.  I tried calling him to let him know as well as discuss the tier exemption form that needs to be completed.  I had to leave a HIPPA compliant message on his voice mail.  I will attempt to call him back next week if I do not hear from him .   Deanne Coffer, PharmD, [place] [phone]

## 2015-02-04 ENCOUNTER — Other Ambulatory Visit: Payer: Self-pay | Admitting: *Deleted

## 2015-02-04 NOTE — Patient Outreach (Signed)
[facility]) Care Management   02/04/2015  Subjective: "I am feeling fine now"  Objective:   Review of Systems  Constitutional: Negative.   HENT:       Denies headache today  Respiratory: Negative.   Cardiovascular: Negative.  Negative for chest pain, palpitations and leg swelling.  Musculoskeletal: Negative.  Negative for falls.  Skin: Negative.   Neurological: Negative for dizziness and headaches.  Psychiatric/Behavioral: Negative for depression and suicidal ideas. The patient is nervous/anxious.     Physical Exam  Constitutional: He is oriented to person, place, and time. He appears well-developed and well-nourished.  Cardiovascular: Normal rate and normal heart sounds.   Respiratory: Effort normal.  GI: Soft.  Neurological: He is alert and oriented to person, place, and time.  Patient reports unchanged, weakness left arm, and slight facial droop  Skin: Skin is warm and dry.  Psychiatric: Judgment normal.    Current Medications:   Current Outpatient Prescriptions  Medication Sig Dispense Refill  . ALPRAZolam (XANAX) 0.5 MG tablet Take 1 mg by mouth 3 (three) times daily as needed for anxiety or sleep (takes every night for sleep and then as needed throughout the day).     Marland Kitchen apixaban (ELIQUIS) 5 MG TABS tablet Take 1 tablet (5 mg total) by mouth 2 (two) times daily. 120 tablet 0  . clopidogrel (PLAVIX) 75 MG tablet TAKE 1 TABLET BY MOUTH ONCE DAILY. 30 tablet 6  . HYDROcodone-acetaminophen (NORCO) 10-325 MG per tablet Take 1 tablet by mouth every 6 (six) hours as needed for moderate pain.    Marland Kitchen levothyroxine (SYNTHROID, LEVOTHROID) 100 MCG tablet Take 100 mcg by mouth daily before breakfast.    . metoprolol tartrate (LOPRESSOR) 25 MG tablet Take 0.5 tablets (12.5 mg total) by mouth 2 (two) times daily. 15 tablet 0  . pantoprazole (PROTONIX) 40 MG tablet Take 40 mg by mouth daily.    . ramipril (ALTACE) 5 MG capsule Take 5 mg by mouth daily. Take one tablet  at bedtime    . traZODone (DESYREL) 100 MG tablet Take 200 mg by mouth at bedtime.    Marland Kitchen ezetimibe (ZETIA) 10 MG tablet Take 1 tablet (10 mg total) by mouth daily. (Patient not taking: Reported on 02/04/2015) 30 tablet 0  . isosorbide mononitrate (IMDUR) 30 MG 24 hr tablet Take 30 mg by mouth daily.     No current facility-administered medications for this visit.    Functional Status:   In your present state of health, do you have any difficulty performing the following activities: 12/31/2014 11/29/2014  Hearing? Y N  Vision? N N  Difficulty concentrating or making decisions? [name]  Walking or climbing stairs? Y Y  Dressing or bathing? N N  Doing errands, shopping? N Y  Conservation officer, nature and eating ? N -  Using the Toilet? N -  In the past six months, have you accidently leaked urine? N -  Do you have problems with loss of bowel control? N -  Managing your Medications? N -  Managing your Finances? N -  Housekeeping or managing your Housekeeping? N -    Fall/Depression Screening:    PHQ 2/9 Scores 12/16/2014 11/19/2014 10/01/2014 10/01/2014 07/19/2014  PHQ - 2 Score _0 0 0  PHQ- 9 Score _1 - -    Assessment:   Follow up routine home visit, Patient reports that he had 2 visits to the Emergency room a few  Weeks ago, with complaint of headache,  chest pain, increased blood pressure,weakness and per ED record Panic attack. Noted patient blood pressure elevated on arrival to ED 175/103 at one visit.Patient states he used his life alert button to summon EMS. Mr.Bun reports that he still has a Industrial/product designer coming to visit him every few weeks.  Recent TIA: Patient denies any new symptoms, denies headache today or any new symptoms. Patient reports that he stopped checking his blood pressure most days because it makes him more anxious. Gait steady when ambulating in home during visit.Patient able to state the importance of notifying RN, MD of new symptoms. Patient very pleased with his  accomplishment of not smoking for the last 60 days, and no longer requiring the nicorette gum.   Falls: Patient denies having a fall in the last 30 days.  Medication concerns: Patient voiced that the situation regarding the cost of his Eliquis has really  made him very anxious, because he cannot afford it , but verbalized being grateful for [facility] support with receiving medication this month.Patient reports taking his medication as prescribed, patient has isosorbide mononitrate on his medication list noted in EPIC, patient does not have medication, said he stopped taking it a while ago , I called his [facility] in [place], they stated it hasn't been filled since August. Patient also reports that he recently stopped taking his zetia, he complained of muscle aches.  Patient has upcoming appointment with Dr.Mclean, Cardiologist on November 30 th, and Dr.Whyte on December 7, patient plans to attend both appointments.   Plan:  Place call to Dr.Whyte's office to verify patient current medication list, and to update them on patient's current medication compliance. Collaborate with Deanne Coffer, Pharmacy regarding ongoing cost of Eliquis for the patient Patient will resume to check blood pressure on a regular basis at least 3 days per week  and record Patient will notify RN, MD office , of increased blood pressure or new symptoms and EMS for emergency I will follow up with social worker regarding [facility] follow up with patient  [place] Problem One        Most Recent Value   Care Plan Problem One  Falls Frequently   Role Documenting the Problem One  Care Management [place] for Problem One  Active   THN Long Term Goal (31-90 days)  Patient will report decrease in falls in the next 60 days   THN Long Term Goal Start Date  10/01/14   [facility] Long Term Goal Met Date  12/22/14   THN CM Short Term Goal #1 (0-30 days)  Pt will report increased use of  his cane when  walking in the next 30 days    THN CM Short Term Goal #1 Start Date  12/31/14 [goal date reevaluated]   [facility] CM Short Term Goal #1 Met Date  02/04/15 [goal date restarted]   THN CM Short Term Goal #2 (0-30 days)  Patient will report a structured walking program at least 3 days a week for the next 30 days   THN CM Short Term Goal #2 Start Date  10/01/14   [facility] CM Short Term Goal #2 Met Date  11/01/14    [facility] CM Care Plan Problem Two        Most Recent Value   Care Plan Problem Two  Blood pressure Monitor Education   Role Documenting the Problem Two  Care Management [place] for Problem Two  Active  THN Long Term Goal (31-90) days  Patient will demonstrate proper use of Blood Pressure Monitor in the next 31 days   THN Long Term Goal Start Date  11/01/14   THN Long Term Goal Met Date  11/19/14   THN CM Short Term Goal #1 (0-30 days)  Patient will be able to return demonstrate proper use of blood pressure machine   THN CM Short Term Goal #1 Start Date  11/01/14   [facility] CM Short Term Goal #1 Met Date   11/19/14   THN CM Short Term Goal #2 (0-30 days)  Patient will check blood pressure at  least 3 days a week   THN CM Short Term Goal #2 Start Date  11/01/14   [facility] CM Short Term Goal #2 Met Date  11/19/14   THN CM Short Term Goal #3 (0-30 days)  Patient will report importance of taking medication as prescribed and compliance    THN CM Short Term Goal #3 Start Date  12/03/14   Interventions for Short Term Goal #3  Reviewed with patient the immportance in taking all medication as prescribed for his current conditions, for prevention of further problems   THN CM Short Term Goal #4 (0-30 days)  Patient will verbalize increased knowledge of TIA, Stroke symptoms   THN CM Short Term Goal #4 Start Date  12/03/14   [facility] CM Short Term Goal #4 Met Date  12/31/14    [facility] CM Care Plan Problem Three        Most Recent Value   Care Plan Problem Three  Weight Losss related to Nutrition concerns    Role Documenting the Problem Three  Care Management Coordinator   Care Plan for Problem Three  Active   THN Long Term Goal (31-90) days  Patient will be able to report eating a balanced diet    THN Long Term Goal Start Date  11/01/14   THN Long Term Goal Met Date  12/08/14   THN CM Short Term Goal #1 (0-30 days)  Patient will be able to choose soft foods that he is able to tolerate for  for a well balanced diet   THN CM Short Term Goal #1 Start Date  11/01/14   [facility] CM Short Term Goal #1 Met Date  11/19/14

## 2015-02-07 ENCOUNTER — Other Ambulatory Visit: Payer: Self-pay | Admitting: *Deleted

## 2015-02-07 NOTE — Patient Outreach (Signed)
[facility]) Care Management  02/07/2015  Bobby Floyd August 21, 1960 [id number]   Incoming telephone call received from patient wanting to inform me that he has a doctor's appointment coming up this week with the Cardiologist and they may be taking him off of his Zetia and Eliquis medication. I ask patient to call me back after his appointment letting me know the outcome of his visit with the doctor. If patient will no longer be on Zetia medication, we will not need to continue the patient assistance application process.  Alajah Witman L. [place] Sickles, [place] Care Management Assistant

## 2015-02-07 NOTE — Patient Outreach (Signed)
[facility]) Care Management  02/07/2015  Bobby Floyd [birth year] [id number]   Incoming call from nurse at  [place] office  returning phone call that I had placed on 11/25 regarding  medication concern whether the Medication  Imdur 30 mg is currently on his recent medication list,  patient reports not taking the  medication. Nurse to discuss with Dr.Whyte whether patient should restart medication and states the office will call [name] with instructions if he is to refill and restart medication or not.   Joylene Draft, RN, [place] Management [phone]- Mobile [phone]- Toll Free Main Office

## 2015-02-08 ENCOUNTER — Telehealth: Payer: Self-pay | Admitting: Cardiology

## 2015-02-08 ENCOUNTER — Encounter: Payer: Self-pay | Admitting: *Deleted

## 2015-02-08 NOTE — Telephone Encounter (Signed)
Spoke with Jenny Reichmann at the dentist office . Pt came in due to tooth ache and swelling on gum area. The dentist prescribed an antibiotics, and an OTC decongestant Sudafed. Jenny Reichmann is aware that pt has history of A-Fib and this medication potentially would increased his heart rate. Jenny Reichmann is aware that pt can safely take Coricidin HBP. Cindy verbalized understanding, she will call pt to let him know of  the medication pt can take.

## 2015-02-08 NOTE — Telephone Encounter (Signed)
Pt was at Dr. Estrella Myrtle office -dentist- they are giving pt rx for abx for sinus infection and want to give him OTC decongestant -Sudafed- is this safe for patient to take? pls advise Cindy at [phone]/OY

## 2015-02-09 ENCOUNTER — Other Ambulatory Visit: Payer: Self-pay | Admitting: *Deleted

## 2015-02-09 ENCOUNTER — Encounter: Payer: Self-pay | Admitting: Cardiology

## 2015-02-09 ENCOUNTER — Ambulatory Visit (INDEPENDENT_AMBULATORY_CARE_PROVIDER_SITE_OTHER): Payer: Medicare HMO | Admitting: Cardiology

## 2015-02-09 VITALS — BP 130/106 | HR 69 | Ht 74.0 in | Wt 180.0 lb

## 2015-02-09 DIAGNOSIS — I25119 Atherosclerotic heart disease of native coronary artery with unspecified angina pectoris: Secondary | ICD-10-CM | POA: Diagnosis not present

## 2015-02-09 DIAGNOSIS — Z955 Presence of coronary angioplasty implant and graft: Secondary | ICD-10-CM | POA: Diagnosis not present

## 2015-02-09 DIAGNOSIS — I480 Paroxysmal atrial fibrillation: Secondary | ICD-10-CM | POA: Diagnosis not present

## 2015-02-09 DIAGNOSIS — I10 Essential (primary) hypertension: Secondary | ICD-10-CM | POA: Diagnosis not present

## 2015-02-09 DIAGNOSIS — E7800 Pure hypercholesterolemia, unspecified: Secondary | ICD-10-CM

## 2015-02-09 LAB — CBC WITH DIFFERENTIAL/PLATELET
BASOS ABS: 0.1 10*3/uL (ref 0.0–0.1)
Basophils Relative: 1 % (ref 0–1)
Eosinophils Absolute: 0.1 10*3/uL (ref 0.0–0.7)
Eosinophils Relative: 2 % (ref 0–5)
HEMATOCRIT: 47.5 % (ref 39.0–52.0)
HEMOGLOBIN: 16.2 g/dL (ref 13.0–17.0)
LYMPHS ABS: 1.7 10*3/uL (ref 0.7–4.0)
LYMPHS PCT: 27 % (ref 12–46)
MCH: 32 pg (ref 26.0–34.0)
MCHC: 34.1 g/dL (ref 30.0–36.0)
MCV: 93.7 fL (ref 78.0–100.0)
MPV: 9.5 fL (ref 8.6–12.4)
Monocytes Absolute: 0.9 10*3/uL (ref 0.1–1.0)
Monocytes Relative: 14 % — ABNORMAL HIGH (ref 3–12)
NEUTROS PCT: 56 % (ref 43–77)
Neutro Abs: 3.5 10*3/uL (ref 1.7–7.7)
Platelets: 152 10*3/uL (ref 150–400)
RBC: 5.07 MIL/uL (ref 4.22–5.81)
RDW: 12.9 % (ref 11.5–15.5)
WBC: 6.2 10*3/uL (ref 4.0–10.5)

## 2015-02-09 LAB — BASIC METABOLIC PANEL
BUN: 19 mg/dL (ref 7–25)
CHLORIDE: 104 mmol/L (ref 98–110)
CO2: 25 mmol/L (ref 20–31)
Calcium: 9.2 mg/dL (ref 8.6–10.3)
Creat: 1 mg/dL (ref 0.70–1.33)
Glucose, Bld: 81 mg/dL (ref 65–99)
POTASSIUM: 3.9 mmol/L (ref 3.5–5.3)
Sodium: 140 mmol/L (ref 135–146)

## 2015-02-09 MED ORDER — POTASSIUM CHLORIDE CRYS ER 20 MEQ PO TBCR: 20.0000 meq | EXTENDED_RELEASE_TABLET | Freq: Every day | ORAL | Status: DC

## 2015-02-09 MED ORDER — CHLORTHALIDONE 25 MG PO TABS: 25.0000 mg | ORAL_TABLET | Freq: Every day | ORAL | Status: DC

## 2015-02-09 MED ORDER — CARVEDILOL 6.25 MG PO TABS: 6.2500 mg | ORAL_TABLET | Freq: Two times a day (BID) | ORAL | Status: DC

## 2015-02-09 NOTE — Patient Instructions (Signed)
Medication Instructions:   Stop metoprolol.  Start coreg 6.'25mg'$  two times a day. Start chlorthalidone '25mg'$  daily.  Start KCL (potassium ) 20 meq daily  Labwork: BMET/BNP today  BMET in 1 week.   Testing/Procedures: None today  Follow-Up: Your physician recommends that you schedule a follow-up appointment in: 1 week with PA/NP--lab in 1 week--BMET.   Any Other Special Instructions Will Be Listed Below (If Applicable).  You have been referred to Lipid Clinic for evaluation for new cholesterol medication--PCSK9 inhibitor. You should get a call from the pharmacist in the next few days about an appointment.      If you need a refill on your cardiac medications before your next appointment, please call your pharmacy.

## 2015-02-09 NOTE — Patient Outreach (Deleted)
[facility]) Care Management  02/09/2015  Bobby Floyd 1960/12/14 [id number]   Phone call to patient to assess for continued need for social work involvement.  HIPPA compliant voicemail message left for a return call.   Sheralyn Boatman [facility] Care Management [phone]

## 2015-02-10 DIAGNOSIS — I10 Essential (primary) hypertension: Secondary | ICD-10-CM | POA: Insufficient documentation

## 2015-02-10 NOTE — Progress Notes (Signed)
Patient ID: Bobby Floyd, male   DOB: 07/26/1960, 54 y.o.   MRN: [id number] PCP: Dr Tobie Poet Tia Alert)  54 yo with history of extensive CAD presents for cardiology followup.  He was admitted in 9/15 with unstable angina and had DES to the pLAD, mLCx, and dLCx.  Echo showed EF 55-60%.  In 3/16, he was admitted to the hospital in [facility] and had DES to the LCx.  He was admitted in 9/16 with TIA symptoms and noted to have atrial fibrillation (paroxysmal).  Eliquis was started in addition to his Plavix, aspirin was stopped.  Echo in 9/16 showed EF 55-60% with moderate LVH.   Today, he is in NSR.  He is a somewhat difficult historian.  BP has been running high.  SBP in 160s-180s when he checks at home.  He says that he gets a headache and chest pain as well as feeling dizzy when his BP gets "really high."  Generally, he seems to be doing ok with no baseline exertional dyspnea or chest pain.  He has quit smoking. He is not on a statin due to myalgias, and he is not on Zetia due to cost.   Labs (9/15): K 3.6, creatinine 0.95, HCT 49.6 Labs (9/16): K 3.7, creatinine 0.95, LDL 177, HDL 43  PMH: 1. C-spine surgery 2. Tonsillar cancer s/p surgery 3. GERD 4. Hyperlipidemia: Myalgias with multiple statins.  5. Depression 6. Melanoma 7. CAD: 1st MI at 54.  NSTEMI 4/13 with PTCA OM2 (nondominant right was subtotally occluded).  Unstable angina 9/15 with LHC showing 70-80% pLAD, 60% apical LAD, 95% mLCx, 80% dLCx, diffuse moderate disease of nondominant RCA.  Patient had DES to pLAD, mLCx, and dLCx.   Echo (9/15) with EF 55-60%, no rWMAs.   Admission to [facility] in 3/16, had DES to LCx.  Echo (9/16) with EF 55-60%, moderate LVH.   8. TIA (9/16) 9. Atrial fibrillation: Paroxysmal.  10. HTN  SH: Quit smoking.  Lives in [place].  FH: Grandmother with CABG at 40, uncles/aunts with MIs.    ROS: All systems reviewed and negative except as per HPI.   Current Outpatient Prescriptions  Medication Sig  Dispense Refill  . ALPRAZolam (XANAX) 0.5 MG tablet Take 1 mg by mouth 3 (three) times daily as needed for anxiety or sleep (takes every night for sleep and then as needed throughout the day).     Marland Kitchen apixaban (ELIQUIS) 5 MG TABS tablet Take 1 tablet (5 mg total) by mouth 2 (two) times daily. 120 tablet 0  . clopidogrel (PLAVIX) 75 MG tablet TAKE 1 TABLET BY MOUTH ONCE DAILY. 30 tablet 6  . ezetimibe (ZETIA) 10 MG tablet Take 1 tablet (10 mg total) by mouth daily. 30 tablet 0  . HYDROcodone-acetaminophen (NORCO) 10-325 MG per tablet Take 1 tablet by mouth every 6 (six) hours as needed for moderate pain.    . isosorbide mononitrate (IMDUR) 30 MG 24 hr tablet Take 30 mg by mouth daily.    Marland Kitchen levothyroxine (SYNTHROID, LEVOTHROID) 100 MCG tablet Take 100 mcg by mouth daily before breakfast.    . pantoprazole (PROTONIX) 40 MG tablet Take 40 mg by mouth daily.    . ramipril (ALTACE) 5 MG capsule Take 5 mg by mouth daily. Take one tablet at bedtime    . traZODone (DESYREL) 100 MG tablet Take 200 mg by mouth at bedtime.    . carvedilol (COREG) 6.25 MG tablet Take 1 tablet (6.25 mg total) by mouth 2 (two) times  daily. 60 tablet 3  . chlorthalidone (HYGROTON) 25 MG tablet Take 1 tablet (25 mg total) by mouth daily. 30 tablet 3  . potassium chloride SA (K-DUR,KLOR-CON) 20 MEQ tablet Take 1 tablet (20 mEq total) by mouth daily. 30 tablet 3   No current facility-administered medications for this visit.    BP 130/106 mmHg  Pulse 69  Ht '6\' 2"'$  (1.88 m)  Wt 180 lb (81.647 kg)  BMI 23.10 kg/m2 General: NAD Neck: No JVD, no thyromegaly or thyroid nodule.  Lungs: Clear to auscultation bilaterally with normal respiratory effort. CV: Nondisplaced PMI.  Heart regular S1/S2, +S4, no murmur.  No peripheral edema.  No carotid bruit.  Normal pedal pulses.  Abdomen: Soft, nontender, no hepatosplenomegaly, no distention.  Skin: Intact without lesions or rashes.  Neurologic: Alert and oriented x 3.  Psych: Normal  affect. Extremities: No clubbing or cyanosis.  HEENT: Normal.   Assessment/Plan: 1. CAD: Most recent PCI in 3/16, DES to LCx.  He is not getting exertional chest pain but does have some chest pain when BP gets especially high.     - He is currently on Plavix + apixaban.  I will have him continue Plavix until 3/17 then stop it and continue apixaban.   2. Hyperlipidemia: He is not on a statin due to myalgias with multiple statins.  He is not able to afford Zetia, and I do not think it would get him to goal regardless.  I would like to get him on Repatha.  I will refer him to the pharmacy lipid clinic to work on this.  3. Atrial fibrillation: Paroxysmal. CHADSVASC = 4. He is in NSR today.  Continue Eliquis.  4. HTN: Poorly controlled.   - Continue ramipril. - Stop metoprolol and start on Coreg 6.25 mg bid.  - Start chlorthalidone 25 mg daily with KCl 20 daily.  He will need a BMET in 1 week.   Followup in 1 week with PA and 6 wks with me.   [name] 02/10/2015

## 2015-02-11 NOTE — Patient Outreach (Signed)
[facility]) Care Management  02/11/2015  Bobby Floyd 02-13-1961 [id number]   Telephone call received from patient to inform me that he is no longer on Zetia, therefore we will stop the process of the patient assistance application for [employer]. Patient states he is still on Eliquis and his doctor submitted another patient assistance application in an attempt to get it approved this time. I told patient to follow up with me if he hasn't heard from the patient assistance company and he is close to running out of his medication.  [name], [place] Care Management Assistant
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
## 2015-02-17 ENCOUNTER — Ambulatory Visit (INDEPENDENT_AMBULATORY_CARE_PROVIDER_SITE_OTHER): Payer: Medicare HMO | Admitting: Pharmacist

## 2015-02-17 ENCOUNTER — Encounter: Payer: Self-pay | Admitting: Physician Assistant

## 2015-02-17 ENCOUNTER — Other Ambulatory Visit (INDEPENDENT_AMBULATORY_CARE_PROVIDER_SITE_OTHER): Payer: Medicare HMO

## 2015-02-17 ENCOUNTER — Ambulatory Visit (INDEPENDENT_AMBULATORY_CARE_PROVIDER_SITE_OTHER): Payer: Medicare HMO | Admitting: Physician Assistant

## 2015-02-17 VITALS — BP 102/80 | HR 94 | Ht 74.0 in | Wt 179.6 lb

## 2015-02-17 DIAGNOSIS — I25119 Atherosclerotic heart disease of native coronary artery with unspecified angina pectoris: Secondary | ICD-10-CM

## 2015-02-17 DIAGNOSIS — I480 Paroxysmal atrial fibrillation: Secondary | ICD-10-CM

## 2015-02-17 DIAGNOSIS — I10 Essential (primary) hypertension: Secondary | ICD-10-CM

## 2015-02-17 DIAGNOSIS — I2510 Atherosclerotic heart disease of native coronary artery without angina pectoris: Secondary | ICD-10-CM | POA: Diagnosis not present

## 2015-02-17 DIAGNOSIS — E785 Hyperlipidemia, unspecified: Secondary | ICD-10-CM

## 2015-02-17 DIAGNOSIS — E7800 Pure hypercholesterolemia, unspecified: Secondary | ICD-10-CM

## 2015-02-17 LAB — BASIC METABOLIC PANEL
BUN: 21 mg/dL (ref 7–25)
CHLORIDE: 98 mmol/L (ref 98–110)
CO2: 31 mmol/L (ref 20–31)
CREATININE: 1.13 mg/dL (ref 0.70–1.33)
Calcium: 9.7 mg/dL (ref 8.6–10.3)
GLUCOSE: 96 mg/dL (ref 65–99)
Potassium: 3.8 mmol/L (ref 3.5–5.3)
SODIUM: 137 mmol/L (ref 135–146)

## 2015-02-17 MED ORDER — ISOSORBIDE MONONITRATE ER 30 MG PO TB24: 15.0000 mg | ORAL_TABLET | Freq: Every day | ORAL | Status: DC

## 2015-02-17 NOTE — Patient Instructions (Addendum)
Medication Instructions:  Your physician has recommended you make the following change in your medication:  1.  DECREASE the Imdur to 30 mg taking 1/2 tablet daily.  If you notice that your blood pressure goes back up consistently to 140/90, then you will need to increase the Imdur back to 1 tablet a day.    Labwork: TODAY;  BMET  Testing/Procedures: None ordered  Follow-Up: Your physician recommends that you keep your scheduled follow-up appointment with WITH DR. Aundra Dubin   Any Other Special Instructions Will Be Listed Below (If Applicable).     If you need a refill on your cardiac medications before your next appointment, please call your pharmacy.
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
## 2015-02-18 ENCOUNTER — Telehealth: Payer: Self-pay | Admitting: Pharmacist

## 2015-02-18 DIAGNOSIS — E7800 Pure hypercholesterolemia, unspecified: Secondary | ICD-10-CM

## 2015-02-18 MED ORDER — ALIROCUMAB 75 MG/ML ~~LOC~~ SOPN: 2.0000 "pen " | PEN_INJECTOR | SUBCUTANEOUS | Status: DC

## 2015-02-18 NOTE — Telephone Encounter (Signed)
Praluent approved by patient's insurance. Rx sent to [facility]. Called pt to inform him. He spoke with [employer] yesterday for medication assistance. He says they will mail him something in 4-6 weeks and that he may qualify for Medicaid as well. Pt will come in for samples on 12/12 to cover him for the month until his medication assistance starts.

## 2015-02-21 ENCOUNTER — Ambulatory Visit: Payer: Self-pay | Admitting: Pharmacist

## 2015-02-23 ENCOUNTER — Other Ambulatory Visit: Payer: Self-pay | Admitting: *Deleted

## 2015-02-23 NOTE — Patient Outreach (Signed)
[facility]) Care Management  02/23/2015  Bobby Floyd 01-Mar-1961 [id number]   Phone call to patient to follow up on social work needs.  Patient very frustrated, stating that he is being prescribed medications that he cannot afford,.  Per patient, his current cholesterol medication has a co-pay of $400.00.  Patient discussed being frustrated with the process of trying to get his medications and just wants to stop taking it all.  Patient states that he has enough medicine for December, however is now concerned about January.  This Education officer, museum assessed for suicidally.  Patient denies current thoughts of suicide, however continues to report issues with finances and not being able to afford medications.  ":I am not going back to a mental hospital"   This social worker spoke to patient's  RNCM-Bobby Floyd who plans to see patient on 02/24/15 at 12:30pm.  Per RNCM, patient is receiving help with his prescriptions but it is a process.   Plan: patient to be referred to [facility] to address anxiety issues.   Sheralyn Boatman [facility] Care Management [phone]

## 2015-02-24 ENCOUNTER — Other Ambulatory Visit: Payer: Self-pay | Admitting: *Deleted

## 2015-02-24 NOTE — Patient Outreach (Signed)
[place] [facility]) Care Management   02/24/2015  Bobby Floyd 07-25-1960 [id number]  Bobby Floyd is an 54 y.o. male  Subjective: "I have a sinus infection, and taking antibiotics"  Objective:   Review of Systems  Constitutional: Negative.  Negative for fever.  HENT: Positive for sore throat.        Reports being treated for sinus infection, by his dentist  Eyes: Negative.   Respiratory: Negative for cough and shortness of breath.   Cardiovascular: Negative for chest pain and palpitations.  Gastrointestinal: Negative.   Genitourinary: Negative.   Musculoskeletal: Negative for falls.  Neurological: Negative for dizziness.  Psychiatric/Behavioral: Positive for depression. Negative for suicidal ideas. The patient is nervous/anxious.     Physical Exam  Constitutional: He is oriented to person, place, and time. He appears well-developed and well-nourished.  Cardiovascular: Normal rate and normal heart sounds.   Respiratory: Breath sounds normal.  Neurological: He is oriented to person, place, and time.  Skin: Skin is warm and dry.  Psychiatric: He has a normal mood and affect. His behavior is normal. Judgment and thought content normal.   BP 118/70 mmHg  Pulse 88  Resp 18  SpO2 98% Current Medications:   Current Outpatient Prescriptions  Medication Sig Dispense Refill  . Alirocumab (PRALUENT) 75 MG/ML SOPN Inject 2 pens into the skin every 14 (fourteen) days. 2 pen 11  . ALPRAZolam (XANAX) 0.5 MG tablet Take 1 mg by mouth 3 (three) times daily as needed for anxiety or sleep (takes every night for sleep and then as needed throughout the day).     Marland Kitchen amoxicillin (AMOXIL) 500 MG tablet Take 500 mg by mouth 2 (two) times daily. One tablet 3 times a day until completed    . apixaban (ELIQUIS) 5 MG TABS tablet Take 1 tablet (5 mg total) by mouth 2 (two) times daily. 120 tablet 0  . carvedilol (COREG) 6.25 MG tablet Take 1 tablet (6.25 mg total) by mouth 2  (two) times daily. 60 tablet 3  . chlorthalidone (HYGROTON) 25 MG tablet Take 1 tablet (25 mg total) by mouth daily. 30 tablet 3  . clopidogrel (PLAVIX) 75 MG tablet TAKE 1 TABLET BY MOUTH ONCE DAILY. 30 tablet 6  . HYDROcodone-acetaminophen (NORCO) 10-325 MG per tablet Take 1 tablet by mouth every 6 (six) hours as needed for moderate pain.    . isosorbide mononitrate (IMDUR) 30 MG 24 hr tablet Take 0.5 tablets (15 mg total) by mouth daily. 45 tablet 3  . levothyroxine (SYNTHROID, LEVOTHROID) 100 MCG tablet Take 100 mcg by mouth daily before breakfast.    . pantoprazole (PROTONIX) 40 MG tablet Take 40 mg by mouth daily.    . potassium chloride SA (K-DUR,KLOR-CON) 20 MEQ tablet Take 1 tablet (20 mEq total) by mouth daily. 30 tablet 3  . ramipril (ALTACE) 5 MG capsule Take 5 mg by mouth daily. Take one tablet at bedtime    . traZODone (DESYREL) 100 MG tablet Take 200 mg by mouth at bedtime.    Marland Kitchen ezetimibe (ZETIA) 10 MG tablet Take 1 tablet (10 mg total) by mouth daily. (Patient not taking: Reported on 02/24/2015) 30 tablet 0   No current facility-administered medications for this visit.    Functional Status:   In your present state of health, do you have any difficulty performing the following activities: 12/31/2014 11/29/2014  Hearing? Y N  Vision? N N  Difficulty concentrating or making decisions? Bobby Floyd  Walking or climbing stairs? Bobby Floyd  Y  Dressing or bathing? N N  Doing errands, shopping? N Y  Conservation officer, nature and eating ? N -  Using the Toilet? N -  In the past six months, have you accidently leaked urine? N -  Do you have problems with loss of bowel control? N -  Managing your Medications? N -  Managing your Finances? N -  Housekeeping or managing your Housekeeping? N -    Fall/Depression Screening:    PHQ 2/9 Scores 02/24/2015 12/16/2014 11/19/2014 10/01/2014 10/01/2014 07/19/2014  PHQ - 2 Score _0 0 0  PHQ- 9 Score _1 - -    Assessment:   Patient reports being treated for a  sinus infection, complaint of having a sinus congestion, at dentist appointment on yesterday he was prescribed po antibiotics, denies fever. Encouraged to complete all of medication and notify his PCP if complaints continue.  Patient reported that he has not had a bowel movement in at least a week, abdomen soft ,bowel sounds present, reports that he is passing gas. BobbyFloyd states that he is going to start eating some fresh fruits that he can tolerate, drinking more water and look into taking a laxative. Informed him to notify PCP of continued complaints and no bowel movement.  TIA: No new  signs or symptoms, BobbyFloyd reports that he is tolerating his normal activity,states he had already been  Out and  walked over the apartment complex area after breakfast, without complaint of dizziness.Patient states that he stopped checking his blood pressure because it was tearing his nerves up. Blood pressure better controlled with changes in medication.   Medication : Patient voiced frustration with cost of medication and the paperwork steps that it is taking to get help. Patient verbalized concern over cost of Eliquis again and states that he can not afford it. Patient currently has all medication present , and using his weekly pill box. Patient has at least a weeks supply of Eliquis in the bottle, and he has at least another week's supply of samples of Eliquis 2.68m, patient able to state how he would take Eliquis 2.5 mg for his ordered dose of Eliquis 5 mg twice daily. Mr. AWaddingtonstates that he is able to give himself the Praluent injection with problems and has enough of the medication to last through the month.  Mental Health BobbyGuiffre voices frustration about cost of medication and the process it takes to get assistance,"I am not going to chase the medication down, TOctavia Bruckneris a fNurse, adultbut I get tired too". Patient denies suicide thoughts, stating I am not going to do anything to myself or anyone else.  Discussed his normal coping, stress relieving actions. LCSW, CNew Casselworking with patient and referring to [employer]    Plan:  Review progression on patient care goals Patient will resume checking his blood pressure and recording on a regular basis Discuss today's visit with Chrystal Land,LCSW Collaborate with DDeanne Coffer Pharmacist regarding patient medication cost concerns and update patient Will schedule home visit for January 20 at 10 am.    [facility] Problem [facility]        Most Recent Value   Care Plan Problem One  Falls Frequently   Role Documenting the Problem One  Care Management [place]autau[place]for Problem One  Active   THN Long Term Goal (31-90 days)  Patient will report decrease in falls in the next 60 days   THN Long  Term Goal Start Date  10/01/14   THN Long Term Goal Met Date  12/22/14   THN CM Short Term Goal #1 (0-30 days)  Pt will report increased use of  his cane when walking in the next 30 days    THN CM Short Term Goal #1 Start Date  12/31/14 [goal date reevaluated]   [facility] CM Short Term Goal #1 Met Date  02/04/15 [goal date restarted]   Interventions for Short Term Goal #1  patient referred to [place] [facility] program   THN CM Short Term Goal #2 (0-30 days)  Patient will report a structured walking program at least 3 days a week for the next 30 days   THN CM Short Term Goal #2 Start Date  10/01/14   [facility] CM Short Term Goal #2 Met Date  11/01/14    [facility] CM Care Plan Problem Two        Most Recent Value   Care Plan Problem Two  Blood pressure Monitor Education   Role Documenting the Problem Two  Care Management Coordinator   Care Plan for Problem Two  Active   THN Long Term Goal (31-90) days  Patient will demonstrate proper use of Blood Pressure Monitor in the next 31 days   THN Long Term Goal Start Date  11/01/14   THN Long Term Goal Met Date  11/19/14   THN CM Short Term Goal #1 (0-30 days)  Patient will be able to  return demonstrate proper use of blood pressure machine   THN CM Short Term Goal #1 Start Date  11/01/14   [facility] CM Short Term Goal #1 Met Date   11/19/14   THN CM Short Term Goal #2 (0-30 days)  Patient will check blood pressure at  least 3 days a week   THN CM Short Term Goal #2 Start Date  11/01/14   [facility] CM Short Term Goal #2 Met Date  11/19/14   Interventions for Short Term Goal #2  Reviewed importance of checking blood pressure and keeping a record, and to notify MD of increases, or symptoms    THN CM Short Term Goal #3 (0-30 days)  Patient will report importance of taking medication as prescribed and compliance    THN CM Short Term Goal #3 Start Date  12/03/14   [facility] CM Short Term Goal #3 Met Date  02/24/15   THN CM Short Term Goal #4 (0-30 days)  Patient will verbalize increased knowledge of TIA, Stroke symptoms   THN CM Short Term Goal #4 Start Date  12/03/14   [facility] CM Short Term Goal #4 Met Date  12/31/14    [facility] CM Care Plan Problem Three        Most Recent Value   Care Plan Problem Three  Weight Losss related to Nutrition concerns   Role Documenting the Problem Three  Care Management Coordinator   Care Plan for Problem Three  Active   THN Long Term Goal (31-90) days  Patient will be able to report eating a balanced diet    THN Long Term Goal Start Date  11/01/14   THN Long Term Goal Met Date  12/08/14   THN CM Short Term Goal #1 (0-30 days)  Patient will be able to choose soft foods that he is able to tolerate for  for a well balanced diet   THN CM Short Term Goal #1 Start Date  11/01/14   [facility] CM Short Term Goal #1 Met Date  11/19/14     Joylene Draft, RN, [place] [facility] [phone]- Mobile [phone]- [place]

## 2015-03-02 ENCOUNTER — Other Ambulatory Visit: Payer: Self-pay | Admitting: *Deleted

## 2015-03-02 NOTE — Patient Outreach (Addendum)
[facility]) Care Management  03/02/2015  Bobby Floyd Aug 24, 1960 [id number]   Phone call to patient to follow up on referral for [facility].  Per patient, they may have called, however he has not been able to return their call yet.  Per patient, he has all of his medication at present and is currently working with the [facility] program for prescription assistance.   Per patient he can now afford his Eliquis, however has concerns about getting his Praluent injection, he has enough samples to last him though 03/25/15 but is working with [facility] to develop a plan for after the 03/25/15.   Plan: This social encouraged patient to return the calls from [facility] will follow up with patient within 2 weeks.

## 2015-03-04 ENCOUNTER — Other Ambulatory Visit: Payer: Self-pay

## 2015-03-04 NOTE — Patient Outreach (Signed)
I called Bobby Floyd to follow up on his Eliquis.  He stated he has enough to last until he sees his cardiologist on March 25, 2015.  I told him I have the [facility] Tier exemption forms that the cardiologist needs to complete to request the cost of Eliquis to go down.  He ask for me to mail them to him.  I stated I will do that today.  I also stated that if he needed assistance with his Eliquis before he goes back to the cardiologist to give me a call.  He stated he would.  I will follow up with Bobby Floyd after his cardiology visit to determine if we need to complete further paperwork.   Deanne Coffer, PharmD, [place] [phone]

## 2015-03-17 ENCOUNTER — Other Ambulatory Visit: Payer: Self-pay | Admitting: *Deleted

## 2015-03-17 NOTE — Patient Outreach (Signed)
[facility]) Care Management  03/17/2015  Bobby Floyd [birth year] [id number]   Phone call to patient to follow up with referral made to [facility].  Per patient, he is active with [facility] and  is feeling much better. Per patient, his mood has improved, [facility] referral helpful.  Patient states that his medications are "under control" with the doctor discontinuing a lot of his medication that he was taking during his last visit.  Patient continues to work with the pharmacist and RNCM.  Plan:  Patient verbalized having no further social work needs at this time.   Patient's case to be closed to social work at this time.   Sheralyn Boatman [facility] Care Management [phone]

## 2015-03-25 ENCOUNTER — Encounter: Payer: Self-pay | Admitting: Cardiology

## 2015-03-25 ENCOUNTER — Ambulatory Visit (INDEPENDENT_AMBULATORY_CARE_PROVIDER_SITE_OTHER): Payer: Medicare HMO | Admitting: Cardiology

## 2015-03-25 VITALS — BP 140/80 | HR 74 | Ht 74.0 in | Wt 186.8 lb

## 2015-03-25 DIAGNOSIS — I480 Paroxysmal atrial fibrillation: Secondary | ICD-10-CM

## 2015-03-25 DIAGNOSIS — Z955 Presence of coronary angioplasty implant and graft: Secondary | ICD-10-CM

## 2015-03-25 DIAGNOSIS — I10 Essential (primary) hypertension: Secondary | ICD-10-CM

## 2015-03-25 DIAGNOSIS — I2510 Atherosclerotic heart disease of native coronary artery without angina pectoris: Secondary | ICD-10-CM

## 2015-03-25 DIAGNOSIS — E7800 Pure hypercholesterolemia, unspecified: Secondary | ICD-10-CM | POA: Diagnosis not present

## 2015-03-25 MED ORDER — CARVEDILOL 3.125 MG PO TABS: 3.1250 mg | ORAL_TABLET | Freq: Two times a day (BID) | ORAL | Status: DC

## 2015-03-25 NOTE — Patient Instructions (Signed)
Medication Instructions:  Start coreg (carvedilol) 3.'125mg'$  two times a day.  YOU CAN STOP PLAVIX June 11, 2015  Labwork: Your physician recommends that you have lab in 1 month--Lipid profilfe/CBCd/BMET --I have given you a prescription for this. Please fax the results to Dr Aundra Dubin [phone]   Testing/Procedures: None   Follow-Up: Your physician recommends that you schedule a follow-up appointment in: 3 months with Dr Aundra Dubin.        If you need a refill on your cardiac medications before your next appointment, please call your pharmacy.

## 2015-03-26 NOTE — Progress Notes (Signed)
Patient ID: Bobby Floyd, male   DOB: 16-Apr-1960, 55 y.o.   MRN: [id number] PCP: Dr Eliezer Mccoy [facility])  55 yo with history of extensive CAD presents for cardiology followup.  He was admitted in 9/15 with unstable angina and had DES to the pLAD, mLCx, and dLCx.  Echo showed EF 55-60%.  In 3/16, he was admitted to the hospital in [facility] and had DES to the LCx.  He was admitted in 9/16 with TIA symptoms and noted to have atrial fibrillation (paroxysmal).  Eliquis was started in addition to his Plavix, aspirin was stopped.  Echo in 9/16 showed EF 55-60% with moderate LVH.   Today, he is in NSR.  BP is still high at 140/80.  He developed a headache and stopped chlorthalidone, Coreg, and Imdur because of a persistent headache. Headache is now gone.  No chest pain.  No exertional dyspnea.  He walks about 5 miles/day.  He is not on a statin due to myalgias, and he is not on Zetia due to cost.  He has been getting Praluent, but has been using samples so far.  Waiting to see if he can get patient assistance.   Labs (9/15): K 3.6, creatinine 0.95, HCT 49.6 Labs (9/16): K 3.7, creatinine 0.95, LDL 177, HDL 43 Labs (12/16): K 3.8, creatinine 1.13  PMH: 1. C-spine surgery 2. Tonsillar cancer s/p surgery 3. GERD 4. Hyperlipidemia: Myalgias with multiple statins.  5. Depression 6. Melanoma 7. CAD: 1st MI at 24.  NSTEMI 4/13 with PTCA OM2 (nondominant right was subtotally occluded).  Unstable angina 9/15 with LHC showing 70-80% pLAD, 60% apical LAD, 95% mLCx, 80% dLCx, diffuse moderate disease of nondominant RCA.  Patient had DES to pLAD, mLCx, and dLCx.   Echo (9/15) with EF 55-60%, no rWMAs.   Admission to [facility] in 3/16, had DES to LCx.  Echo (9/16) with EF 55-60%, moderate LVH.   8. TIA (9/16) 9. Atrial fibrillation: Paroxysmal.  10. HTN  SH: Quit smoking.  Lives in [place].  FH: Grandmother with CABG at 33, uncles/aunts with MIs.    ROS: All systems reviewed and negative except as  per HPI.   Current Outpatient Prescriptions  Medication Sig Dispense Refill  . Alirocumab (PRALUENT) 75 MG/ML SOPN Inject 2 pens into the skin every 14 (fourteen) days. 2 pen 11  . ALPRAZolam (XANAX) 0.5 MG tablet Take 1 mg by mouth 3 (three) times daily as needed for anxiety or sleep (takes every night for sleep and then as needed throughout the day).     Marland Kitchen amoxicillin (AMOXIL) 500 MG tablet Take 500 mg by mouth 2 (two) times daily. Reported on 03/17/2015    . apixaban (ELIQUIS) 5 MG TABS tablet Take 1 tablet (5 mg total) by mouth 2 (two) times daily. 120 tablet 0  . clopidogrel (PLAVIX) 75 MG tablet TAKE 1 TABLET BY MOUTH ONCE DAILY. 30 tablet 6  . HYDROcodone-acetaminophen (NORCO) 10-325 MG per tablet Take 1 tablet by mouth every 6 (six) hours as needed for moderate pain.    Marland Kitchen levothyroxine (SYNTHROID, LEVOTHROID) 100 MCG tablet Take 100 mcg by mouth daily before breakfast.    . pantoprazole (PROTONIX) 40 MG tablet Take 40 mg by mouth daily.    . ramipril (ALTACE) 5 MG capsule Take 5 mg by mouth daily. Take one tablet at bedtime    . traZODone (DESYREL) 100 MG tablet Take 200 mg by mouth at bedtime.    . carvedilol (COREG) 3.125 MG tablet  Take 1 tablet (3.125 mg total) by mouth 2 (two) times daily. 180 tablet 1   No current facility-administered medications for this visit.    BP 140/80 mmHg  Pulse 74  Ht '6\' 2"'$  (1.88 m)  Wt 186 lb 12.8 oz (84.732 kg)  BMI 23.97 kg/m2 General: NAD Neck: No JVD, no thyromegaly or thyroid nodule.  Lungs: Clear to auscultation bilaterally with normal respiratory effort. CV: Nondisplaced PMI.  Heart regular S1/S2, +S4, no murmur.  No peripheral edema.  No carotid bruit.  Normal pedal pulses.  Abdomen: Soft, nontender, no hepatosplenomegaly, no distention.  Skin: Intact without lesions or rashes.  Neurologic: Alert and oriented x 3.  Psych: Normal affect. Extremities: No clubbing or cyanosis.  HEENT: Normal.   Assessment/Plan: 1. CAD: Most recent PCI in  3/16, DES to LCx.  No chest pain. - He is currently on Plavix + apixaban.  I will have him continue Plavix until 4/17, then stop it and continue apixaban.  He may have a problem affording apixaban with his insurance this year, he will let us know.  - Stay off Imdur given headache.  2. Hyperlipidemia: He is not on a statin due to myalgias with multiple statins.  He is not able to afford Zetia, and I do not think it would get him to goal regardless.  He is currently on Praluent but is getting samples.  Waiting to see if we can get him on the patient assistance list.   - Check lipids in 1 month on Praluent.  3. Atrial fibrillation: Paroxysmal. CHADSVASC = 4. He is in NSR today.  Continue Eliquis.  4. HTN: Was controlled, now high since he stopped Coreg and chlorthalidone.  Neither of these was causing his headache, it was likely from Imdur.   - Continue ramipril. - Restart Coreg.  If BP remains high, will restart chlorthalidone.    Followup in 3 months   [name] 03/26/2015

## 2015-04-01 ENCOUNTER — Ambulatory Visit: Payer: Self-pay | Admitting: *Deleted

## 2015-04-01 ENCOUNTER — Other Ambulatory Visit: Payer: Self-pay | Admitting: *Deleted

## 2015-04-01 ENCOUNTER — Other Ambulatory Visit: Payer: Self-pay

## 2015-04-01 NOTE — Patient Outreach (Signed)
  [facility]) Care Management  04/01/2015  LORENE SAMAAN 24-Dec-1960 [id number]   Received phone call from patient stating that he needed to change our scheduled appointment for today, returned patient call, and rescheduled appointment. Patient briefly discussed that his medications had changed after recent PCP appointment, patient states that he is feeling better, physically and emotionally, he has started doing volunteer work with his church.    Plan Rescheduled home visit to January 30 at 2:30 Discussed progress with goals and case closure  Joylene Draft, RN, [place] Management [phone]- Mobile [phone]- [place]   .

## 2015-04-01 NOTE — Patient Outreach (Signed)
I called Mr. Kosloski to determine if he was able to get a Tier exception for Eliquis.  He stated he left the paperwork with the pharmacist at Dr. Claris Gladden office.  I have emailed them to determine if the paperwork has been done.  One I hear from them, I will contact Mr. Ethington back.    Deanne Coffer, PharmD, [place] [phone]

## 2015-04-05 ENCOUNTER — Telehealth: Payer: Self-pay

## 2015-04-05 NOTE — Telephone Encounter (Signed)
Tier exception for Eliquis 5 mg sent to [facility].

## 2015-04-07 ENCOUNTER — Telehealth: Payer: Self-pay

## 2015-04-07 NOTE — Telephone Encounter (Signed)
Tier exception for Eliquis '5mg'$  approved by [facility]. Auth good through 03/11/2016.

## 2015-04-11 ENCOUNTER — Other Ambulatory Visit: Payer: Self-pay | Admitting: *Deleted

## 2015-04-11 ENCOUNTER — Telehealth: Payer: Self-pay | Admitting: Cardiology

## 2015-04-11 NOTE — Telephone Encounter (Signed)
New problem   Pt need to speak to someone concerning his Praluent injectio '75mg'$  that he is out of.

## 2015-04-11 NOTE — Telephone Encounter (Signed)
Returned patient's call - he is giving his last injection of Praluent on 2/6 before he will need more samples. Told pt that it will be hard to have a supply of Praluent samples every month for him so that we need to think about a better long term solution. Asked for an update on the [facility] program and Medicaid. Per patient, he makes $8 too much to qualify for Medicaid. With the [facility] program coverage, his copay of Praluent would still be >$100 a month which he cannot afford. He has the number for the [employer] foundation although they currently do not have any money for [employer]. He will stop by on 2/13 for samples of Praluent.  He is also excited to report that his Eliquis copay is now $7 a month since he got a tier exception for it. He wants to return his free 30 day card for someone who could use it more than him. He will drop this off when he picks up Praluent on 2/13.

## 2015-04-11 NOTE — Patient Outreach (Addendum)
[facility]) Care Management   04/11/2015  Bobby Floyd 06-08-1960 [id number]  Bobby Floyd is an 55 y.o. male  Subjective: "I am doing much better, appetite good, I am still being very active."  Objective:   Review of Systems  Constitutional: Negative.   HENT: Negative.   Respiratory: Negative.   Cardiovascular: Negative.  Negative for chest pain and leg swelling.  Gastrointestinal: Negative.   Genitourinary: Negative.   Musculoskeletal: Negative for falls.  Skin: Negative.   Neurological: Negative.   Psychiatric/Behavioral: Negative for depression and suicidal ideas.   BP 140/70 mmHg  Pulse 70  Resp 18  SpO2 98% Physical Exam  Constitutional: He is oriented to person, place, and time. He appears well-developed and well-nourished.  Cardiovascular: Normal rate and normal heart sounds.   Respiratory: Effort normal and breath sounds normal.  Neurological: He is oriented to person, place, and time.  Skin: Skin is warm and dry.  Psychiatric: He has a normal mood and affect. His behavior is normal. Judgment and thought content normal.    Current Medications:   Current Outpatient Prescriptions  Medication Sig Dispense Refill  . Alirocumab (PRALUENT) 75 MG/ML SOPN Inject 2 pens into the skin every 14 (fourteen) days. 2 pen 11  . ALPRAZolam (XANAX) 0.5 MG tablet Take 1 mg by mouth 3 (three) times daily as needed for anxiety or sleep (takes every night for sleep and then as needed throughout the day).     Marland Kitchen amoxicillin (AMOXIL) 500 MG tablet Take 500 mg by mouth 2 (two) times daily. Reported on 03/17/2015    . apixaban (ELIQUIS) 5 MG TABS tablet Take 1 tablet (5 mg total) by mouth 2 (two) times daily. 120 tablet 0  . carvedilol (COREG) 3.125 MG tablet Take 1 tablet (3.125 mg total) by mouth 2 (two) times daily. 180 tablet 1  . clopidogrel (PLAVIX) 75 MG tablet TAKE 1 TABLET BY MOUTH ONCE DAILY. 30 tablet 6  . HYDROcodone-acetaminophen (NORCO) 10-325 MG  per tablet Take 1 tablet by mouth every 6 (six) hours as needed for moderate pain.    Marland Kitchen levothyroxine (SYNTHROID, LEVOTHROID) 100 MCG tablet Take 100 mcg by mouth daily before breakfast.    . pantoprazole (PROTONIX) 40 MG tablet Take 40 mg by mouth daily.    . ramipril (ALTACE) 5 MG capsule Take 5 mg by mouth daily. Take one tablet at bedtime    . traZODone (DESYREL) 100 MG tablet Take 200 mg by mouth at bedtime.     No current facility-administered medications for this visit.    Functional Status:   In your present state of health, do you have any difficulty performing the following activities: 12/31/2014 11/29/2014  Hearing? Y N  Vision? N N  Difficulty concentrating or making decisions? [name]  Walking or climbing stairs? Y Y  Dressing or bathing? N N  Doing errands, shopping? N Y  Conservation officer, nature and eating ? N -  Using the Toilet? N -  In the past six months, have you accidently leaked urine? N -  Do you have problems with loss of bowel control? N -  Managing your Medications? N -  Managing your Finances? N -  Housekeeping or managing your Housekeeping? N -    Fall/Depression Screening:    PHQ 2/9 Scores 03/17/2015 02/24/2015 12/16/2014 11/19/2014 10/01/2014 10/01/2014 07/19/2014  PHQ - 2 Score 0 '6 2 2 2 ' 0 0  PHQ- 9 Score - '12 5 8 6 ' - -  Assessment:   Routine home visit, patient in good spirits  Hypertension Patient reports that his blood pressure has been under good control since recent medication changes, patient occasionally monitors his blood pressure at home, his states that blood pressure has been ranging 120's/80. Bobby Floyd denies dizziness, or any signs of tia or stroke. Discussed importance of limiting salt in diet, patient states that he is aware  Is watches salt in diet.   Falls No falls, patient reports that he feeling stronger.  Patient reports that he is feeling better physically, emotionally, he is volunteering for food pantry at his church, and community, patient  also has recently adopted a Environmental health practitioner.  Care management needs have been met, patient is managing well at home, his is taking his medication as prescribed, his follows up with his PCP and oncologist regularly, monitors his blood pressue.  Bobby Floyd's  only concern at present is related to getting assistance with praluent medication,he is currently getting samples from cardiology office and has only one dose left that is due on 2/6 and the next dose is due 15 days after that per patient.  Bobby Floyd has received a letter from [facility] stating that he has been approved for tier exemption for his eliquis, we contacted [employer], and they stated the co payment would be $7.00, will discuss with Deanne Coffer, Pharmacist.    Plan:  Will follow up with patient in one week, and plan case closure as goals are being met, patient in agreement. In basket message to Dr.Dawn Pettus, regarding Eliquis tier exemption letter.   Joylene Draft, RN, [place] Management [phone]- Mobile [phone]- T[employer] Free Main Office [facility] CM Care Plan Problem One        Most Recent Value   Care Plan Problem One  Falls Frequently   Role Documenting the Problem One  Care Management [place] for Problem One  Active   [facility] Long Term Goal (31-90 days)  Patient will report decrease in falls in the next 60 days   [facility] Long Term Goal Start Date  10/01/14   [facility] Long Term Goal Met Date  12/22/14   [facility] CM Short Term Goal #1 (0-30 days)  Pt will report increased use of  his cane when walking in the next 30 days    [facility] CM Short Term Goal #1 Start Date  12/31/14 [goal date reevaluated]   [facility] CM Short Term Goal #1 Met Date  02/04/15 [goal date restarted]   [facility] CM Short Term Goal #2 (0-30 days)  Patient will report a structured walking program at least 3 days a week for the next 30 days   [facility] CM Short Term Goal #2 Start Date  10/01/14   [facility] CM Short Term Goal #2 Met Date  11/01/14    [facility] CM Care Plan Problem  Two        Most Recent Value   Care Plan Problem Two  Blood pressure Monitor Education   Role Documenting the Problem Two  Care Management Coordinator   Care Plan for Problem Two  Active   [facility] Long Term Goal (31-90) days  Patient will demonstrate proper use of Blood Pressure Monitor in the next 31 days   [facility]N Long Term Goal Start Date  11/01/14   [facility] Long Term Goal Met Date  11/19/14   [facility] CM Short Term Goal #1 (0-30 days)  Patient will be able to return demonstrate proper use of blood pressure machine   [facility]N  CM Short Term Goal #1 Start Date  11/01/14   [facility] CM Short Term Goal #1 Met Date   11/19/14   [facility] CM Short Term Goal #2 (0-30 days)  Patient will check blood pressure at  least 3 days a week   [facility]N CM Short Term Goal #2 Start Date  11/01/14   [facility] CM Short Term Goal #2 Met Date  11/19/14   [facility] CM Short Term Goal #3 (0-30 days)  Patient will report importance of taking medication as prescribed and compliance    THN CM Short Term Goal #3 Start Date  12/03/14   [facility] CM Short Term Goal #3 Met Date  02/24/15   [facility] CM Short Term Goal #4 (0-30 days)  Patient will verbalize increased knowledge of TIA, Stroke symptoms   THN CM Short Term Goal #4 Start Date  12/03/14   [facility] CM Short Term Goal #4 Met Date  12/31/14    [facility] CM Care Plan Problem Three        Most Recent Value   Care Plan Problem Three  Weight Losss related to Nutrition concerns   Role Documenting the Problem Three  Care Management Coordinator   Care Plan for Problem Three  Active   THN Long Term Goal (31-90) days  Patient will be able to report eating a balanced diet    THN Long Term Goal Start Date  11/01/14   [facility] Long Term Goal Met Date  12/08/14   [facility] CM Short Term Goal #1 (0-30 days)  Patient will be able to choose soft foods that he is able to tolerate for  for a well balanced diet   THN CM Short Term Goal #1 Start Date  11/01/14   [facility] CM Short Term Goal #1 Met Date  11/19/14

## 2015-04-19 ENCOUNTER — Other Ambulatory Visit: Payer: Self-pay | Admitting: *Deleted

## 2015-04-19 NOTE — Patient Outreach (Addendum)
[place] [facility]) Care Management  [place]  04/19/2015   Bobby Floyd 10-04-1960 [id number]  Subjective: Patient reports that he is doing well., "behaving"  Current Medications:  Current Outpatient Prescriptions  Medication Sig Dispense Refill  . Alirocumab (PRALUENT) 75 MG/ML SOPN Inject 2 pens into the skin every 14 (fourteen) days. 2 pen 11  . ALPRAZolam (XANAX) 0.5 MG tablet Take 1 mg by mouth 3 (three) times daily as needed for anxiety or sleep (takes every night for sleep and then as needed throughout the day).     Marland Kitchen amoxicillin (AMOXIL) 500 MG tablet Take 500 mg by mouth 2 (two) times daily. Reported on 04/11/2015    . apixaban (ELIQUIS) 5 MG TABS tablet Take 1 tablet (5 mg total) by mouth 2 (two) times daily. 120 tablet 0  . carvedilol (COREG) 3.125 MG tablet Take 1 tablet (3.125 mg total) by mouth 2 (two) times daily. (Patient not taking: Reported on 04/11/2015) 180 tablet 1  . clopidogrel (PLAVIX) 75 MG tablet TAKE 1 TABLET BY MOUTH ONCE DAILY. 30 tablet 6  . HYDROcodone-acetaminophen (NORCO) 10-325 MG per tablet Take 1 tablet by mouth every 6 (six) hours as needed for moderate pain.    Marland Kitchen levothyroxine (SYNTHROID, LEVOTHROID) 100 MCG tablet Take 100 mcg by mouth daily before breakfast.    . pantoprazole (PROTONIX) 40 MG tablet Take 40 mg by mouth daily.    . ramipril (ALTACE) 5 MG capsule Take 10 mg by mouth daily. Take one tablet at bedtime    . traZODone (DESYREL) 100 MG tablet Take 200 mg by mouth at bedtime.     No current facility-administered medications for this visit.    Functional Status:  In your present state of health, do you have any difficulty performing the following activities: 04/11/2015 12/31/2014  Hearing? Y Y  Vision? - N  Difficulty concentrating or making decisions? N Y  Walking or climbing stairs? Y Y  Dressing or bathing? N N  Doing errands, shopping? N N  Preparing Food and eating ? N N  Using the Toilet? N N  In the  past six months, have you accidently leaked urine? N N  Do you have problems with loss of bowel control? N N  Managing your Medications? N N  Managing your Finances? N N  Housekeeping or managing your Housekeeping? N N    Fall/Depression Screening: PHQ 2/9 Scores 03/17/2015 02/24/2015 12/16/2014 11/19/2014 10/01/2014 10/01/2014 07/19/2014  PHQ - 2 Score 0 _0 0 0  PHQ- 9 Score - _1 - -    Assessment:   Hypertension: Blood pressure stable,  Patient continues to take medications as prescribed, and denies complaints, patient continues to monitor it at home, with reading within normal range.    Falls: No falls in greater than 60 days  Patient reports taking all medication as prescribed, he has recently gotten prescription for Elquis refilled with tier exemption only costing $7.00.  Bobby Floyd reports that he is going to cardiology office on Monday for samples of Praluent injections, as he only has 1 dose  left.   Care management needs have been met . Bobby Floyd continues to report that he is managing well at home, remains active, involved in [employer], and states that helps him emotionally by helping others. Bobby Floyd has quit smoking since his TIA, patient able to state sign/symptoms and TIA/Stroke and to call 911 Mr. Leadbetter's appetite and intake has improved, patient is aware  of need to monitor salt intake,he does not add salt to foods. Bobby Floyd is able to monitor his blood pressure at home and understands to notify MD of elevates, reports blood pressure controlled, most recent blood pressure per patient report 122/70.   Bobby Floyd expressed thanks for all of the support from [facility], and he has all [facility] contact numbers in case [facility] care management services are needed in the future.  Plan:  Will close case to care management needs. I will notify Deanne Coffer, Pharmacist of case closure I will send patient's PCP notification of case closure   Joylene Draft, RN, [place] Management [phone]- Mobile [phone]- [place] Office

## 2015-04-20 ENCOUNTER — Other Ambulatory Visit: Payer: Self-pay | Admitting: *Deleted

## 2015-04-20 ENCOUNTER — Encounter: Payer: Self-pay | Admitting: *Deleted

## 2015-04-20 NOTE — Patient Outreach (Addendum)
[facility] [facility]) Care Management  04/20/2015  Bobby Floyd June 09, 1960 [id number]  Chart opened in error  Joylene Draft, RN, [facility] Care Management [phone]- Mobile [phone]- [name]

## 2015-04-25 ENCOUNTER — Telehealth: Payer: Self-pay | Admitting: Cardiology

## 2015-04-25 NOTE — Telephone Encounter (Signed)
Pt wants you to know he is coming at lunch time to get his samples. Thank You.

## 2015-04-25 NOTE — Telephone Encounter (Signed)
Pt picked up 2 samples of Praluent '75mg'$ /mL pens.

## 2015-05-16 DIAGNOSIS — I119 Hypertensive heart disease without heart failure: Secondary | ICD-10-CM | POA: Insufficient documentation

## 2015-05-16 DIAGNOSIS — Z79899 Other long term (current) drug therapy: Secondary | ICD-10-CM | POA: Insufficient documentation

## 2015-05-17 ENCOUNTER — Encounter: Payer: Self-pay | Admitting: Cardiology

## 2015-05-31 NOTE — Patient Outreach (Signed)
[facility]) Care Management  05/31/2015  Bobby Floyd 27-Jul-1960 [id number]   Per Deanne Coffer, PharmD note, she is following up on the [facility] Tier exception form and whether it needs to be completed. I am removing myself from the care team.  Bobby Floyd, [place] Care Management Assistant

## 2015-06-06 ENCOUNTER — Telehealth: Payer: Self-pay | Admitting: Cardiology

## 2015-06-06 NOTE — Telephone Encounter (Signed)
°  New Prob   Patient calling the office for samples of medication:   1.  What medication and dosage are you requesting samples for? Praluent 75 mg  2.  Are you currently out of this medication? Pt took his last injection on March 11 and is due for his next injection

## 2015-06-06 NOTE — Telephone Encounter (Signed)
LMOM for patient to come and pick up samples

## 2015-06-06 NOTE — Telephone Encounter (Signed)
New message      Patient calling the office for samples of medication:   1.  What medication and dosage are you requesting samples for? praluent  2.  Are you currently out of this medication? yes

## 2015-06-20 ENCOUNTER — Other Ambulatory Visit: Payer: Self-pay

## 2015-06-20 NOTE — Patient Outreach (Signed)
I called Mr. Cortright to let him know that I was closing his case since he did not have any more pharmacy needs.  He is getting samples of Pradulent from Cardiology and his Eliquis is $7 with the Tier exception that was filed.  I explained what needs to happen next year for him to qualify for it and he stated he understands.  I will close his case to pharmacy.  I will be happy to assist in the pharmacy in the future if needed.   Deanne Coffer, PharmD, [place] [phone]

## 2015-06-29 ENCOUNTER — Ambulatory Visit (INDEPENDENT_AMBULATORY_CARE_PROVIDER_SITE_OTHER): Payer: Medicare HMO | Admitting: Cardiology

## 2015-06-29 ENCOUNTER — Encounter: Payer: Self-pay | Admitting: Cardiology

## 2015-06-29 VITALS — BP 142/74 | HR 64 | Ht 74.0 in | Wt 187.0 lb

## 2015-06-29 DIAGNOSIS — I2510 Atherosclerotic heart disease of native coronary artery without angina pectoris: Secondary | ICD-10-CM

## 2015-06-29 DIAGNOSIS — I10 Essential (primary) hypertension: Secondary | ICD-10-CM | POA: Diagnosis not present

## 2015-06-29 DIAGNOSIS — Z9861 Coronary angioplasty status: Secondary | ICD-10-CM | POA: Diagnosis not present

## 2015-06-29 MED ORDER — CARVEDILOL 6.25 MG PO TABS: 6.2500 mg | ORAL_TABLET | Freq: Two times a day (BID) | ORAL | Status: DC

## 2015-06-29 NOTE — Patient Instructions (Signed)
Your physician has recommended you make the following change in your medication:  1.) INCREASE CARVEDILOL (COREG) 6.25 MG TWICE DAILY  Your physician wants you to follow-up in: [place]. Aundra Dubin.  You will receive a reminder letter in the mail two months in advance. If you don't receive a letter, please call our office to schedule the follow-up appointment.

## 2015-06-30 NOTE — Progress Notes (Signed)
Patient ID: Bobby Floyd, male   DOB: Jul 23, 1960, 55 y.o.   MRN: [id number] PCP: Dr Eliezer Mccoy [facility])  54 yo with history of extensive CAD presents for cardiology followup.  He was admitted in 9/15 with unstable angina and had DES to the pLAD, mLCx, and dLCx.  Echo showed EF 55-60%.  In 3/16, he was admitted to the hospital in [facility] and had DES to the LCx.  He was admitted in 9/16 with TIA symptoms and noted to have atrial fibrillation (paroxysmal).  Eliquis was started in addition to his Plavix, aspirin was stopped.  Echo in 9/16 showed EF 55-60% with moderate LVH.   He is doing well today. BP mildly elevated.  No exertional dyspnea, walking 5 miles/day.  No chest pain. No lightheadedness or palpitations.  He is in NSR today.  No BRBPR or melena.   Labs (9/15): K 3.6, creatinine 0.95, HCT 49.6 Labs (9/16): K 3.7, creatinine 0.95, LDL 177, HDL 43 Labs (12/16): K 3.8, creatinine 1.13 Labs (3/17): K 4.5, creatinine 1.10, HCT 47.8, LDL 87, HDL 77  PMH: 1. C-spine surgery 2. Tonsillar cancer s/p surgery 3. GERD 4. Hyperlipidemia: Myalgias with multiple statins.  5. Depression 6. Melanoma 7. CAD: 1st MI at 63.  NSTEMI 4/13 with PTCA OM2 (nondominant right was subtotally occluded).  Unstable angina 9/15 with LHC showing 70-80% pLAD, 60% apical LAD, 95% mLCx, 80% dLCx, diffuse moderate disease of nondominant RCA.  Patient had DES to pLAD, mLCx, and dLCx.   Echo (9/15) with EF 55-60%, no rWMAs.   Admission to [facility] in 3/16, had DES to LCx.  Echo (9/16) with EF 55-60%, moderate LVH.   8. TIA (9/16) 9. Atrial fibrillation: Paroxysmal.  10. HTN  SH: Quit smoking.  Lives in [place].  FH: Grandmother with CABG at 30, uncles/aunts with MIs.    ROS: All systems reviewed and negative except as per HPI.   Current Outpatient Prescriptions  Medication Sig Dispense Refill  . Alirocumab (PRALUENT) 75 MG/ML SOPN Inject 2 pens into the skin every 14 (fourteen) days. 2 pen 11  .  ALPRAZolam (XANAX) 0.5 MG tablet Take 1 mg by mouth 3 (three) times daily as needed for anxiety or sleep (takes every night for sleep and then as needed throughout the day).     Marland Kitchen apixaban (ELIQUIS) 5 MG TABS tablet Take 1 tablet (5 mg total) by mouth 2 (two) times daily. 120 tablet 0  . HYDROcodone-acetaminophen (NORCO) 10-325 MG per tablet Take 1 tablet by mouth every 6 (six) hours as needed for moderate pain.    Marland Kitchen levothyroxine (SYNTHROID, LEVOTHROID) 100 MCG tablet Take 100 mcg by mouth daily before breakfast.    . pantoprazole (PROTONIX) 40 MG tablet Take 40 mg by mouth daily.    . ramipril (ALTACE) 5 MG capsule Take 10 mg by mouth daily. Take one tablet at bedtime    . traZODone (DESYREL) 100 MG tablet Take 200 mg by mouth at bedtime.    . carvedilol (COREG) 6.25 MG tablet Take 1 tablet (6.25 mg total) by mouth 2 (two) times daily. 180 tablet 3   No current facility-administered medications for this visit.    BP 142/74 mmHg  Pulse 64  Ht '6\' 2"'$  (1.88 m)  Wt 187 lb (84.823 kg)  BMI 24.00 kg/m2 General: NAD Neck: No JVD, no thyromegaly or thyroid nodule.  Lungs: Clear to auscultation bilaterally with normal respiratory effort. CV: Nondisplaced PMI.  Heart regular S1/S2, +S4, no murmur.  No peripheral edema.  No carotid bruit.  Normal pedal pulses.  Abdomen: Soft, nontender, no hepatosplenomegaly, no distention.  Skin: Intact without lesions or rashes.  Neurologic: Alert and oriented x 3.  Psych: Normal affect. Extremities: No clubbing or cyanosis.  HEENT: Normal.   Assessment/Plan: 1. CAD: Most recent PCI in 3/16, DES to LCx.  No chest pain. - He is on apixaban and now off Plavix since it is > 1 year post PCI. - Stay off Imdur given headache.  2. Hyperlipidemia: He is not on a statin due to myalgias with multiple statins.  Continue Praluent.  Lipids improved in 3/16.  3. Atrial fibrillation: Paroxysmal. CHADSVASC = 4. He is in NSR today.  Continue Eliquis.  4. HTN: BP mildly  elevated. - Continue ramipril. - Increase Corest to 6.25 mg bid.     Followup in 6 months   [name] 06/30/2015

## 2015-08-10 ENCOUNTER — Emergency Department (HOSPITAL_COMMUNITY)
Admission: EM | Admit: 2015-08-10 | Discharge: 2015-08-10 | Disposition: A | Discharge status: Home / Self Care | Payer: Medicare HMO | Attending: Emergency Medicine | Admitting: Emergency Medicine

## 2015-08-10 ENCOUNTER — Encounter (HOSPITAL_COMMUNITY): Payer: Self-pay | Admitting: Emergency Medicine

## 2015-08-10 ENCOUNTER — Emergency Department (HOSPITAL_COMMUNITY): Payer: Medicare HMO

## 2015-08-10 DIAGNOSIS — Z79899 Other long term (current) drug therapy: Secondary | ICD-10-CM | POA: Diagnosis not present

## 2015-08-10 DIAGNOSIS — R3912 Poor urinary stream: Secondary | ICD-10-CM | POA: Insufficient documentation

## 2015-08-10 DIAGNOSIS — I509 Heart failure, unspecified: Secondary | ICD-10-CM | POA: Diagnosis not present

## 2015-08-10 DIAGNOSIS — Z85828 Personal history of other malignant neoplasm of skin: Secondary | ICD-10-CM | POA: Insufficient documentation

## 2015-08-10 DIAGNOSIS — I110 Hypertensive heart disease with heart failure: Secondary | ICD-10-CM | POA: Insufficient documentation

## 2015-08-10 DIAGNOSIS — F418 Other specified anxiety disorders: Secondary | ICD-10-CM | POA: Diagnosis not present

## 2015-08-10 DIAGNOSIS — Z85820 Personal history of malignant melanoma of skin: Secondary | ICD-10-CM | POA: Insufficient documentation

## 2015-08-10 DIAGNOSIS — E119 Type 2 diabetes mellitus without complications: Secondary | ICD-10-CM | POA: Diagnosis not present

## 2015-08-10 DIAGNOSIS — R5383 Other fatigue: Secondary | ICD-10-CM

## 2015-08-10 DIAGNOSIS — Z87891 Personal history of nicotine dependence: Secondary | ICD-10-CM | POA: Diagnosis not present

## 2015-08-10 DIAGNOSIS — Z8673 Personal history of transient ischemic attack (TIA), and cerebral infarction without residual deficits: Secondary | ICD-10-CM | POA: Insufficient documentation

## 2015-08-10 DIAGNOSIS — E860 Dehydration: Secondary | ICD-10-CM | POA: Diagnosis present

## 2015-08-10 DIAGNOSIS — E039 Hypothyroidism, unspecified: Secondary | ICD-10-CM | POA: Diagnosis not present

## 2015-08-10 DIAGNOSIS — I252 Old myocardial infarction: Secondary | ICD-10-CM | POA: Insufficient documentation

## 2015-08-10 DIAGNOSIS — R61 Generalized hyperhidrosis: Secondary | ICD-10-CM | POA: Diagnosis not present

## 2015-08-10 DIAGNOSIS — Z85818 Personal history of malignant neoplasm of other sites of lip, oral cavity, and pharynx: Secondary | ICD-10-CM | POA: Diagnosis not present

## 2015-08-10 DIAGNOSIS — I2510 Atherosclerotic heart disease of native coronary artery without angina pectoris: Secondary | ICD-10-CM | POA: Insufficient documentation

## 2015-08-10 DIAGNOSIS — R34 Anuria and oliguria: Secondary | ICD-10-CM

## 2015-08-10 DIAGNOSIS — Z7901 Long term (current) use of anticoagulants: Secondary | ICD-10-CM | POA: Diagnosis not present

## 2015-08-10 LAB — URINALYSIS, ROUTINE W REFLEX MICROSCOPIC
BILIRUBIN URINE: NEGATIVE
Glucose, UA: NEGATIVE mg/dL
HGB URINE DIPSTICK: NEGATIVE
KETONES UR: 15 mg/dL — AB
Leukocytes, UA: NEGATIVE
NITRITE: NEGATIVE
PH: 7.5 (ref 5.0–8.0)
Protein, ur: NEGATIVE mg/dL
SPECIFIC GRAVITY, URINE: 1.012 (ref 1.005–1.030)

## 2015-08-10 LAB — COMPREHENSIVE METABOLIC PANEL
ALK PHOS: 80 U/L (ref 38–126)
ALT: 13 U/L — AB (ref 17–63)
ANION GAP: 9 (ref 5–15)
AST: 24 U/L (ref 15–41)
Albumin: 4.4 g/dL (ref 3.5–5.0)
BILIRUBIN TOTAL: 1.4 mg/dL — AB (ref 0.3–1.2)
BUN: 10 mg/dL (ref 6–20)
CALCIUM: 9.5 mg/dL (ref 8.9–10.3)
CO2: 23 mmol/L (ref 22–32)
Chloride: 104 mmol/L (ref 101–111)
Creatinine, Ser: 1.05 mg/dL (ref 0.61–1.24)
GFR calc non Af Amer: >60 mL/min (ref >60)
Glucose, Bld: 110 mg/dL — ABNORMAL HIGH (ref 65–99)
Potassium: 3.7 mmol/L (ref 3.5–5.1)
SODIUM: 136 mmol/L (ref 135–145)
TOTAL PROTEIN: 6.9 g/dL (ref 6.5–8.1)

## 2015-08-10 LAB — CBC WITH DIFFERENTIAL/PLATELET
Basophils Absolute: 0 10*3/uL (ref 0.0–0.1)
Basophils Relative: 1 %
EOS ABS: 0 10*3/uL (ref 0.0–0.7)
Eosinophils Relative: 0 %
HEMATOCRIT: 47.6 % (ref 39.0–52.0)
HEMOGLOBIN: 16.8 g/dL (ref 13.0–17.0)
LYMPHS ABS: 1.2 10*3/uL (ref 0.7–4.0)
LYMPHS PCT: 16 %
MCH: 31.9 pg (ref 26.0–34.0)
MCHC: 35.3 g/dL (ref 30.0–36.0)
MCV: 90.3 fL (ref 78.0–100.0)
MONOS PCT: 7 %
Monocytes Absolute: 0.6 10*3/uL (ref 0.1–1.0)
NEUTROS ABS: 6.1 10*3/uL (ref 1.7–7.7)
NEUTROS PCT: 76 %
Platelets: 201 10*3/uL (ref 150–400)
RBC: 5.27 MIL/uL (ref 4.22–5.81)
RDW: 12.9 % (ref 11.5–15.5)
WBC: 8 10*3/uL (ref 4.0–10.5)

## 2015-08-10 LAB — I-STAT CG4 LACTIC ACID, ED
LACTIC ACID, VENOUS: 1.17 mmol/L (ref 0.5–2.0)
Lactic Acid, Venous: 1.79 mmol/L (ref 0.5–2.0)

## 2015-08-10 LAB — TROPONIN I: Troponin I: <0.03 ng/mL (ref <0.031)

## 2015-08-10 MED ORDER — SODIUM CHLORIDE 0.9 % IV BOLUS (SEPSIS)
1000.0000 mL | Freq: Once | INTRAVENOUS | Status: AC
  Administered 2015-08-10: 1000 mL via INTRAVENOUS
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
## 2015-08-10 NOTE — ED Notes (Signed)
REMS from home, recent surgery, reports decreased UO, dizzy, recent fall, A/O X4 and in NAD

## 2015-08-10 NOTE — Discharge Instructions (Signed)
Follow-up with your primary care provider within 2 days to have your dehydration reevaluated.  Return to the emergency department if you experience chest pain, shortness of breath, dizziness, you pass out, nausea, vomiting.  Fatigue Fatigue is feeling tired all of the time, a lack of energy, or a lack of motivation. Occasional or mild fatigue is often a normal response to activity or life in general. However, long-lasting (chronic) or extreme fatigue may indicate an underlying medical condition. HOME CARE INSTRUCTIONS  Watch your fatigue for any changes. The following actions may help to lessen any discomfort you are feeling:  Talk to your health care provider about how much sleep you need each night. Try to get the required amount every night.  Take medicines only as directed by your health care provider.  Eat a healthy and nutritious diet. Ask your health care provider if you need help changing your diet.  Drink enough fluid to keep your urine clear or pale yellow.  Practice ways of relaxing, such as yoga, meditation, massage therapy, or acupuncture.  Exercise regularly.   Change situations that cause you stress. Try to keep your work and personal routine reasonable.  Do not abuse illegal drugs.  Limit alcohol intake to no more than 1 drink per day for nonpregnant women and 2 drinks per day for men. One drink equals 12 ounces of beer, 5 ounces of wine, or 1 ounces of hard liquor.  Take a multivitamin, if directed by your health care provider. SEEK MEDICAL CARE IF:   Your fatigue does not get better.  You have a fever.   You have unintentional weight loss or gain.  You have headaches.   You have difficulty:   Falling asleep.  Sleeping throughout the night.  You feel angry, guilty, anxious, or sad.   You are unable to have a bowel movement (constipation).   You skin is dry.   Your legs or another part of your body is swollen.  SEEK IMMEDIATE MEDICAL CARE  IF:   You feel confused.   Your vision is blurry.  You feel faint or pass out.   You have a severe headache.   You have severe abdominal, pelvic, or back pain.   You have chest pain, shortness of breath, or an irregular or fast heartbeat.   You are unable to urinate or you urinate less than normal.   You develop abnormal bleeding, such as bleeding from the rectum, vagina, nose, lungs, or nipples.  You vomit blood.   You have thoughts about harming yourself or committing suicide.   You are worried that you might harm someone else.    This information is not intended to replace advice given to you by your health care provider. Make sure you discuss any questions you have with your health care provider.   Document Released: 12/24/2006 Document Revised: 03/19/2014 Document Reviewed: 06/30/2013 Elsevier Interactive Patient Education [employer].
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
## 2015-09-09 ENCOUNTER — Telehealth: Payer: Self-pay | Admitting: Cardiology

## 2015-09-09 NOTE — Telephone Encounter (Signed)
We do not have samples of Epipens nor does pt use Epipens. He called about Praluent samples - he will come in Monday 7/3.

## 2015-09-09 NOTE — Telephone Encounter (Signed)
New message   Pt is calling for the rn to talk about him needing a epi-pen

## 2015-09-12 ENCOUNTER — Telehealth: Payer: Self-pay | Admitting: Pharmacist

## 2015-09-12 ENCOUNTER — Other Ambulatory Visit: Payer: Self-pay | Admitting: Pharmacist

## 2015-09-12 NOTE — Telephone Encounter (Signed)
Pt came in to fill out patient assistance paperwork to see if [place] will cover [place]. Pt also brought in home meds. Updated list - he has recently been prescribed citalopram and lamotrigine. His pharmacy filled the wrong dose of his carvedilol. Should be taking 6.'25mg'$  BID per most recent OV note from Dr. Aundra Dubin. Pharmacy had refilled old 3.'125mg'$  dose. Called pharmacy and they will correct at next fill. Pt will double up on dose of 3.'125mg'$  tabs and pick up correct refill next month when he's out.

## 2015-10-07 DIAGNOSIS — Z85818 Personal history of malignant neoplasm of other sites of lip, oral cavity, and pharynx: Secondary | ICD-10-CM | POA: Diagnosis not present

## 2015-10-11 ENCOUNTER — Encounter: Payer: Self-pay | Admitting: Cardiology

## 2015-11-09 NOTE — Telephone Encounter (Signed)
This encounter was created in error - please disregard.

## 2015-11-21 ENCOUNTER — Telehealth: Payer: Self-pay | Admitting: Cardiology

## 2015-11-21 DIAGNOSIS — E785 Hyperlipidemia, unspecified: Secondary | ICD-10-CM

## 2015-11-21 NOTE — Telephone Encounter (Signed)
New message    Patient calling the office for samples of medication:   1.  What medication and dosage are you requesting samples for?     Prevalent '25mg'$   2.  Are you currently out of this medication? yes      Pt wants to come to the office 11-22-15 to pick up  Pt said that the rn brings it to him

## 2015-11-21 NOTE — Telephone Encounter (Signed)
Pt will stop by tomorrow for Praluent samples. Will also check a lipid panel since we do not have updated labs since starting Praluent injections.

## 2015-11-22 ENCOUNTER — Other Ambulatory Visit: Payer: Self-pay

## 2015-11-24 ENCOUNTER — Other Ambulatory Visit: Payer: Medicare HMO | Admitting: *Deleted

## 2015-11-24 DIAGNOSIS — E785 Hyperlipidemia, unspecified: Secondary | ICD-10-CM

## 2015-11-24 LAB — HEPATIC FUNCTION PANEL
ALT: 21 U/L (ref 9–46)
AST: 23 U/L (ref 10–35)
Albumin: 4 g/dL (ref 3.6–5.1)
Alkaline Phosphatase: 62 U/L (ref 40–115)
Bilirubin, Direct: 0.1 mg/dL
Indirect Bilirubin: 0.6 mg/dL (ref 0.2–1.2)
Total Bilirubin: 0.7 mg/dL (ref 0.2–1.2)
Total Protein: 5.7 g/dL — ABNORMAL LOW (ref 6.1–8.1)

## 2015-11-24 LAB — LIPID PANEL
CHOLESTEROL: 167 mg/dL (ref 125–200)
HDL: 62 mg/dL (ref >=40)
LDL CALC: 82 mg/dL (ref <130)
Total CHOL/HDL Ratio: 2.7 Ratio (ref <=5.0)
Triglycerides: 115 mg/dL (ref <150)
VLDL: 23 mg/dL (ref <30)

## 2015-12-28 ENCOUNTER — Telehealth: Payer: Self-pay | Admitting: Pharmacist

## 2015-12-28 NOTE — Telephone Encounter (Signed)
Pt stopped by for Praluent samples. Do not have enough samples to provide pt with a continual supply of Praluent. Pt was previously denied from [employer] coverage with [employer] since he had been receiving Praluent samples.  Will have patient stop Praluent samples and recheck baseline lipid panel in the end of December at his PCP. Will then reapply for [employer] in January 2018 for a better solution for financial coverage of [id number].

## 2016-02-13 ENCOUNTER — Other Ambulatory Visit: Payer: Self-pay | Admitting: Pharmacist

## 2016-02-13 ENCOUNTER — Telehealth: Payer: Self-pay | Admitting: Cardiology

## 2016-02-13 DIAGNOSIS — E785 Hyperlipidemia, unspecified: Secondary | ICD-10-CM

## 2016-02-13 NOTE — Telephone Encounter (Signed)
Returned pt's call and scheduled him for lipid panel on Wednesday. Pt has been off Praluent samples - will send baseline LDL to [employer] to see if they will cover pt's copays.

## 2016-02-13 NOTE — Telephone Encounter (Signed)
New message  [t call requesting to speak with Rn about scheduling an appt for lab work. Please call back to discuss

## 2016-02-14 ENCOUNTER — Other Ambulatory Visit: Payer: Self-pay

## 2016-02-15 ENCOUNTER — Other Ambulatory Visit (INDEPENDENT_AMBULATORY_CARE_PROVIDER_SITE_OTHER): Payer: Medicare HMO

## 2016-02-15 ENCOUNTER — Encounter (INDEPENDENT_AMBULATORY_CARE_PROVIDER_SITE_OTHER): Payer: Self-pay

## 2016-02-15 DIAGNOSIS — E785 Hyperlipidemia, unspecified: Secondary | ICD-10-CM | POA: Diagnosis not present

## 2016-02-15 LAB — LIPID PANEL
CHOLESTEROL: 188 mg/dL (ref <200)
HDL: 54 mg/dL (ref >40)
LDL Cholesterol: 122 mg/dL — ABNORMAL HIGH (ref <100)
Total CHOL/HDL Ratio: 3.5 Ratio (ref <5.0)
Triglycerides: 59 mg/dL (ref <150)
VLDL: 12 mg/dL (ref <30)
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
## 2016-02-23 ENCOUNTER — Ambulatory Visit (INDEPENDENT_AMBULATORY_CARE_PROVIDER_SITE_OTHER): Payer: Medicare HMO | Admitting: Pharmacist

## 2016-02-23 DIAGNOSIS — Z79899 Other long term (current) drug therapy: Secondary | ICD-10-CM | POA: Diagnosis not present

## 2016-02-27 ENCOUNTER — Encounter: Payer: Self-pay | Admitting: *Deleted

## 2016-02-27 ENCOUNTER — Telehealth: Payer: Self-pay | Admitting: Pharmacist

## 2016-02-27 DIAGNOSIS — Z006 Encounter for examination for normal comparison and control in clinical research program: Secondary | ICD-10-CM

## 2016-02-27 NOTE — Telephone Encounter (Signed)
[place] to clarify information sent last week. They have approved pt for Repatha coverage for the next year. Pt is aware he will be contacted by [employer] to set up shipment for his [place]. Still awaiting decision on Eliquis coverage from [employer].

## 2016-02-27 NOTE — Progress Notes (Signed)
Late entry:  Subject met inclusion and exclusion criteria. The informed consent form, study requirements and expectations were reviewed with the subject and questions and concerns were addressed prior to the signing of the consent form. The subject verbalized understanding of the trail requirements. The subject agreed to participate in the [place] Registryand signed the informed consent. The informed consent was obtained prior to performance of any protocol-specific procedures for the subject. A copy of the signed informed consent was given to the subject and a copy was placed in the subject's medical record.  Jake Bathe, RN 02/23/2016 1000

## 2016-03-02 ENCOUNTER — Telehealth: Payer: Self-pay

## 2016-03-02 NOTE — Telephone Encounter (Signed)
Patient has been approved for Eliquis through [employer].

## 2016-03-20 DIAGNOSIS — I2510 Atherosclerotic heart disease of native coronary artery without angina pectoris: Secondary | ICD-10-CM | POA: Diagnosis not present

## 2016-03-20 DIAGNOSIS — I119 Hypertensive heart disease without heart failure: Secondary | ICD-10-CM | POA: Diagnosis not present

## 2016-03-20 DIAGNOSIS — E032 Hypothyroidism due to medicaments and other exogenous substances: Secondary | ICD-10-CM | POA: Diagnosis not present

## 2016-03-20 DIAGNOSIS — E785 Hyperlipidemia, unspecified: Secondary | ICD-10-CM | POA: Diagnosis not present

## 2016-03-20 DIAGNOSIS — R6882 Decreased libido: Secondary | ICD-10-CM | POA: Diagnosis not present

## 2016-03-20 DIAGNOSIS — F419 Anxiety disorder, unspecified: Secondary | ICD-10-CM | POA: Diagnosis not present

## 2016-03-20 DIAGNOSIS — Z79899 Other long term (current) drug therapy: Secondary | ICD-10-CM | POA: Diagnosis not present

## 2016-04-11 DIAGNOSIS — F419 Anxiety disorder, unspecified: Secondary | ICD-10-CM | POA: Diagnosis not present

## 2016-04-11 DIAGNOSIS — Z79899 Other long term (current) drug therapy: Secondary | ICD-10-CM | POA: Diagnosis not present

## 2016-04-11 DIAGNOSIS — G2581 Restless legs syndrome: Secondary | ICD-10-CM | POA: Diagnosis not present

## 2016-04-11 DIAGNOSIS — C099 Malignant neoplasm of tonsil, unspecified: Secondary | ICD-10-CM | POA: Diagnosis not present

## 2016-04-11 DIAGNOSIS — R6882 Decreased libido: Secondary | ICD-10-CM | POA: Diagnosis not present

## 2016-04-11 DIAGNOSIS — E870 Hyperosmolality and hypernatremia: Secondary | ICD-10-CM | POA: Diagnosis not present

## 2016-04-17 DIAGNOSIS — M25512 Pain in left shoulder: Secondary | ICD-10-CM | POA: Diagnosis not present

## 2016-04-18 ENCOUNTER — Encounter: Payer: Self-pay | Admitting: Internal Medicine

## 2016-04-26 DIAGNOSIS — Z85818 Personal history of malignant neoplasm of other sites of lip, oral cavity, and pharynx: Secondary | ICD-10-CM | POA: Diagnosis not present

## 2016-04-26 DIAGNOSIS — E039 Hypothyroidism, unspecified: Secondary | ICD-10-CM | POA: Diagnosis not present

## 2016-06-08 ENCOUNTER — Other Ambulatory Visit: Payer: Self-pay | Admitting: Pharmacist

## 2016-06-08 MED ORDER — APIXABAN 5 MG PO TABS: 5.0000 mg | ORAL_TABLET | Freq: Two times a day (BID) | ORAL | 11 refills | Status: DC

## 2016-06-15 ENCOUNTER — Encounter: Payer: Self-pay | Admitting: Physician Assistant

## 2016-06-20 DIAGNOSIS — F331 Major depressive disorder, recurrent, moderate: Secondary | ICD-10-CM | POA: Diagnosis not present
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
## 2016-07-03 ENCOUNTER — Ambulatory Visit (INDEPENDENT_AMBULATORY_CARE_PROVIDER_SITE_OTHER): Payer: PPO | Admitting: Physician Assistant

## 2016-07-03 ENCOUNTER — Encounter (INDEPENDENT_AMBULATORY_CARE_PROVIDER_SITE_OTHER): Payer: Self-pay

## 2016-07-03 ENCOUNTER — Encounter: Payer: Self-pay | Admitting: Physician Assistant

## 2016-07-03 VITALS — BP 110/82 | HR 69 | Ht 74.0 in | Wt 204.4 lb

## 2016-07-03 DIAGNOSIS — I779 Disorder of arteries and arterioles, unspecified: Secondary | ICD-10-CM | POA: Diagnosis not present

## 2016-07-03 DIAGNOSIS — Z9861 Coronary angioplasty status: Secondary | ICD-10-CM

## 2016-07-03 DIAGNOSIS — I739 Peripheral vascular disease, unspecified: Secondary | ICD-10-CM

## 2016-07-03 DIAGNOSIS — I480 Paroxysmal atrial fibrillation: Secondary | ICD-10-CM | POA: Diagnosis not present

## 2016-07-03 DIAGNOSIS — I2510 Atherosclerotic heart disease of native coronary artery without angina pectoris: Secondary | ICD-10-CM | POA: Diagnosis not present

## 2016-07-03 DIAGNOSIS — I10 Essential (primary) hypertension: Secondary | ICD-10-CM

## 2016-07-03 DIAGNOSIS — E7800 Pure hypercholesterolemia, unspecified: Secondary | ICD-10-CM

## 2016-07-03 MED ORDER — APIXABAN 5 MG PO TABS: 5.0000 mg | ORAL_TABLET | Freq: Two times a day (BID) | ORAL | 3 refills | Status: DC

## 2016-07-03 NOTE — Patient Instructions (Addendum)
Medication Instructions:  Your physician recommends that you continue on your current medications as directed. Please refer to the Current Medication list given to you today.  Labwork: None ordered  Testing/Procedures: None ordered  Follow-Up: Your physician wants you to follow-up in: [place] will receive a reminder letter in the mail two months in advance. If you don't receive a letter, please call our office to schedule the follow-up appointment.    Any Other Special Instructions Will Be Listed Below (If Applicable).    If you need a refill on your cardiac medications before your next appointment, please call your pharmacy.

## 2016-07-24 DIAGNOSIS — Z85818 Personal history of malignant neoplasm of other sites of lip, oral cavity, and pharynx: Secondary | ICD-10-CM | POA: Diagnosis not present

## 2016-07-24 DIAGNOSIS — E039 Hypothyroidism, unspecified: Secondary | ICD-10-CM | POA: Diagnosis not present

## 2016-08-21 DIAGNOSIS — I119 Hypertensive heart disease without heart failure: Secondary | ICD-10-CM | POA: Diagnosis not present

## 2016-08-21 DIAGNOSIS — N529 Male erectile dysfunction, unspecified: Secondary | ICD-10-CM | POA: Diagnosis not present

## 2016-08-21 DIAGNOSIS — Z79899 Other long term (current) drug therapy: Secondary | ICD-10-CM | POA: Diagnosis not present

## 2016-08-21 DIAGNOSIS — Z125 Encounter for screening for malignant neoplasm of prostate: Secondary | ICD-10-CM | POA: Diagnosis not present

## 2016-08-21 DIAGNOSIS — Z1211 Encounter for screening for malignant neoplasm of colon: Secondary | ICD-10-CM | POA: Diagnosis not present

## 2016-08-21 DIAGNOSIS — Z1212 Encounter for screening for malignant neoplasm of rectum: Secondary | ICD-10-CM | POA: Diagnosis not present

## 2016-08-21 DIAGNOSIS — Z7901 Long term (current) use of anticoagulants: Secondary | ICD-10-CM | POA: Diagnosis not present

## 2016-08-21 DIAGNOSIS — Z0000 Encounter for general adult medical examination without abnormal findings: Secondary | ICD-10-CM | POA: Diagnosis not present

## 2016-08-21 DIAGNOSIS — E663 Overweight: Secondary | ICD-10-CM | POA: Diagnosis not present

## 2016-08-21 DIAGNOSIS — Z1389 Encounter for screening for other disorder: Secondary | ICD-10-CM | POA: Diagnosis not present

## 2016-08-21 DIAGNOSIS — E032 Hypothyroidism due to medicaments and other exogenous substances: Secondary | ICD-10-CM | POA: Diagnosis not present

## 2016-08-21 DIAGNOSIS — E785 Hyperlipidemia, unspecified: Secondary | ICD-10-CM | POA: Diagnosis not present

## 2016-08-23 ENCOUNTER — Telehealth: Payer: Self-pay | Admitting: Cardiology

## 2016-08-23 DIAGNOSIS — Z23 Encounter for immunization: Secondary | ICD-10-CM | POA: Diagnosis not present

## 2016-08-23 NOTE — Telephone Encounter (Signed)
OK to take Viagra or Cialis after reviewing Katie Thompson's note. He does not take NTG. Candee Furbish, MD

## 2016-08-23 NOTE — Telephone Encounter (Signed)
Pt calling to get ok to get rx for [place] or Viagra for ED-pls call pt [phone]

## 2016-08-23 NOTE — Telephone Encounter (Signed)
Will have Dr Marlou Porch review and call with any orders

## 2016-08-24 MED ORDER — SILDENAFIL CITRATE 20 MG PO TABS: 20.0000 mg | ORAL_TABLET | Freq: Every day | ORAL | 6 refills | Status: DC | PRN

## 2016-08-24 NOTE — Telephone Encounter (Signed)
Per Dr Marlou Porch ok to RX medication. Pt aware.

## 2016-08-24 NOTE — Telephone Encounter (Signed)
Spoke with patient who is asking for an RX for Sildenafil 20 mg to be sent into [facility] in [place].  Advised I will asked Dr Marlou Porch for approval of this and call back once I have heard back.  He states understanding and asked me to leave a message on his voicemail since he will be mowing today.

## 2016-08-30 IMAGING — MR MR HEAD W/O CM
9 of 11 series · 30 of 48 positions shown · non-contrast
Comparison: Prior CT from 11/29/2014.

CLINICAL DATA: Initial evaluation for acute left facial droop and
left-sided weakness. Evaluate for stroke.

EXAM:
MRI HEAD WITHOUT CONTRAST
MRA HEAD WITHOUT CONTRAST
TECHNIQUE: Multiplanar, multiecho pulse sequences of the brain and surrounding
structures were obtained without intravenous contrast. Angiographic
images of the head were obtained using MRA technique without
contrast.

[Series 3: DWI · axial · 3.0mm · 1.09mm/px · z∈[-71,+64]mm · 6 of 92 slices shown (1 of 4)]
[im 1/92]
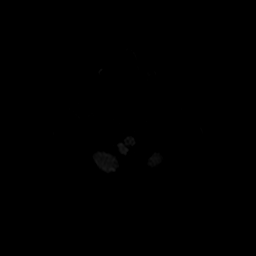
[im 19/92]
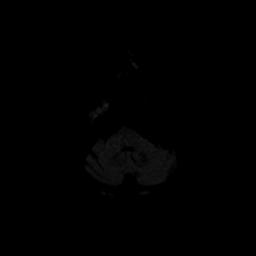
[im 37/92]
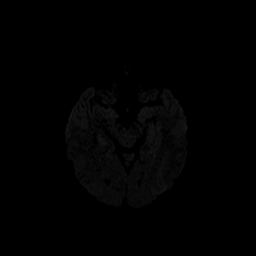
[im 55/92]
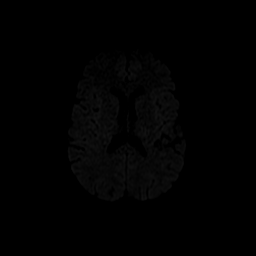
[im 73/92]
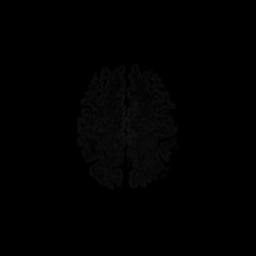
[im 92/92]
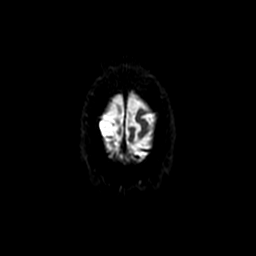

[Series 4: T1 · sagittal · 5.0mm · 0.47mm/px · 2 of 24 slices shown]
[im 1/24]
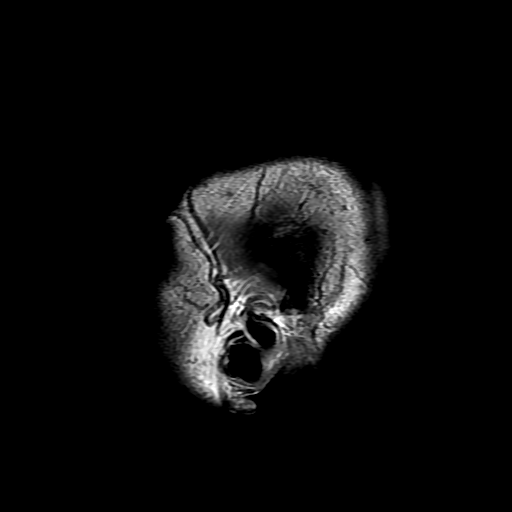
[im 24/24]
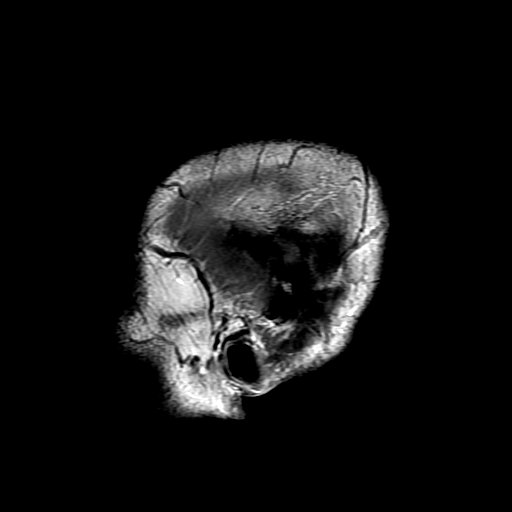

[Series 5: DWI · coronal · 5.0mm · 1.09mm/px · 5 of 66 slices shown (2 of 4)]
[im 1/66]
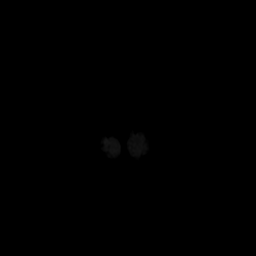
[im 17/66]
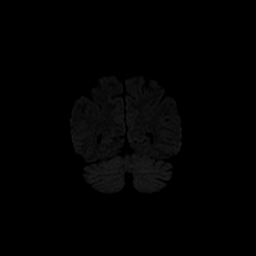
[im 33/66]
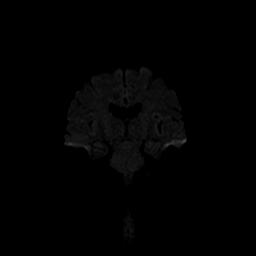
[im 49/66]
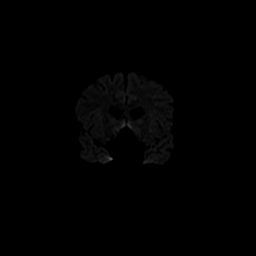
[im 66/66]
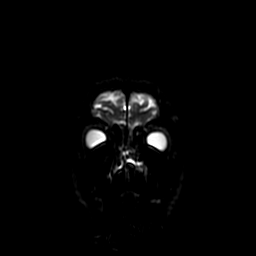

[Series 6: (id) mt fs · axial · 1.4mm · 0.43mm/px · z∈[-86,-38]mm · 4 of 154 slices shown]
[im 1/154]
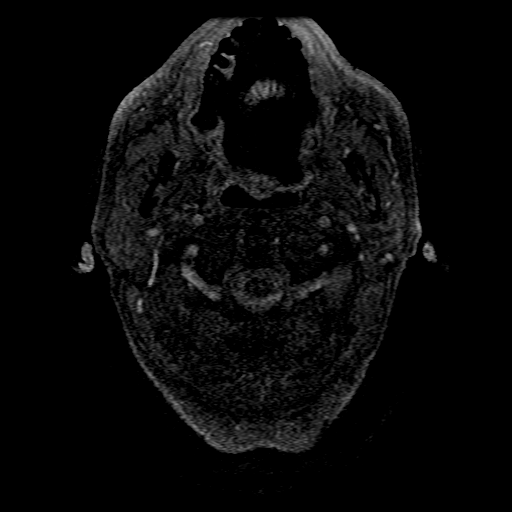
[im 28/154]
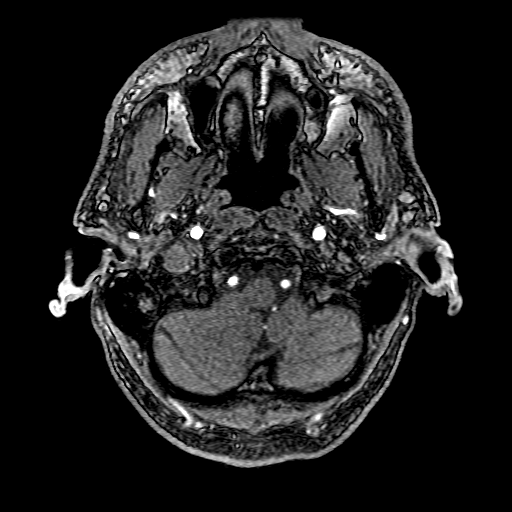
[im 42/154]
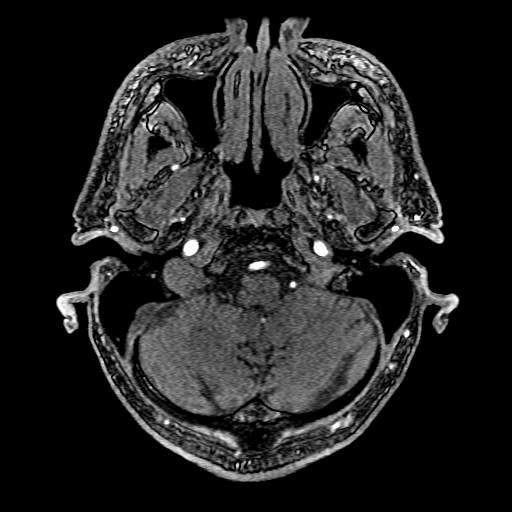
[im 70/154]
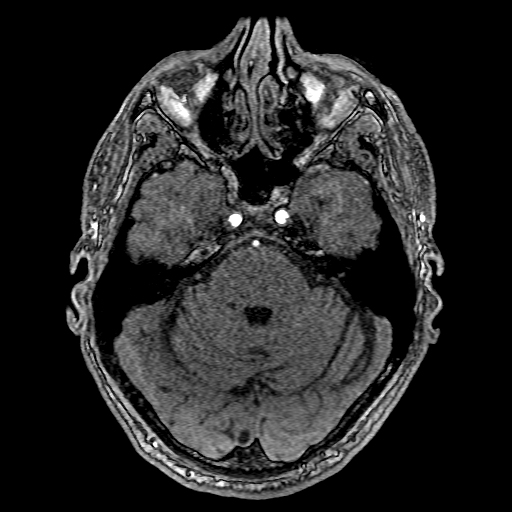

[Series 7: T2 · axial · 5.0mm · 0.43mm/px · z∈[-88,+68]mm · 2 of 27 slices shown (1 of 2)]
[im 1/27]
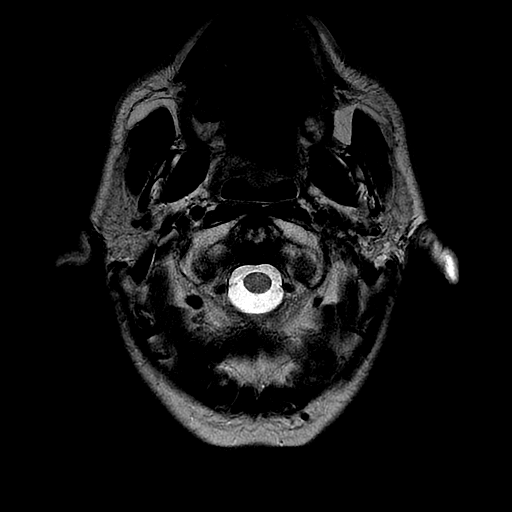
[im 27/27]
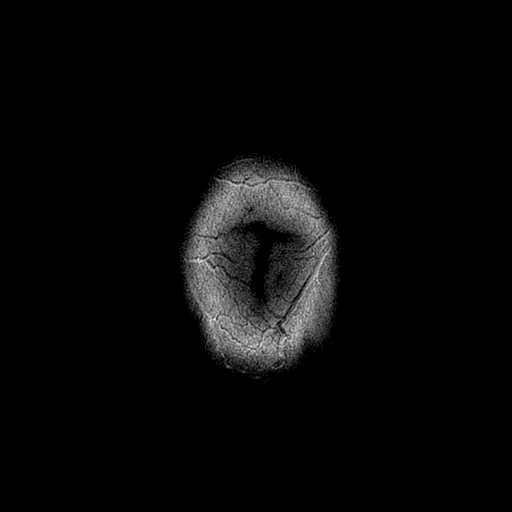

[Series 8: FLAIR · axial · 5.0mm · 0.43mm/px · z∈[-88,+68]mm · 2 of 27 slices shown]
[im 1/27]
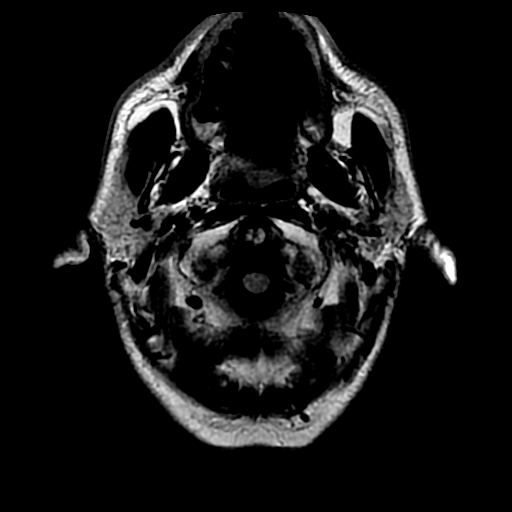
[im 27/27]
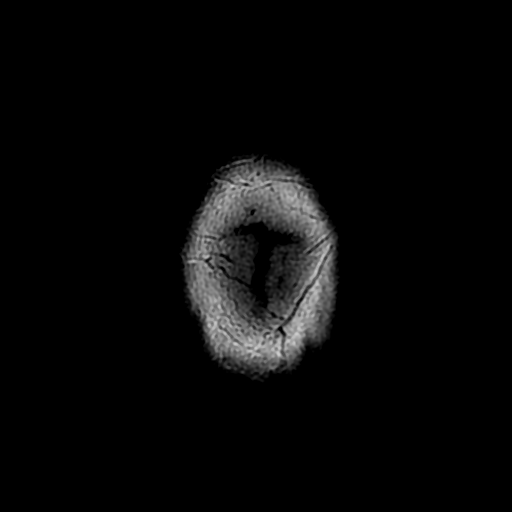

[Series 11: T2 · coronal · 5.0mm · 0.43mm/px · 2 of 30 slices shown (2 of 2)]
[im 1/30]
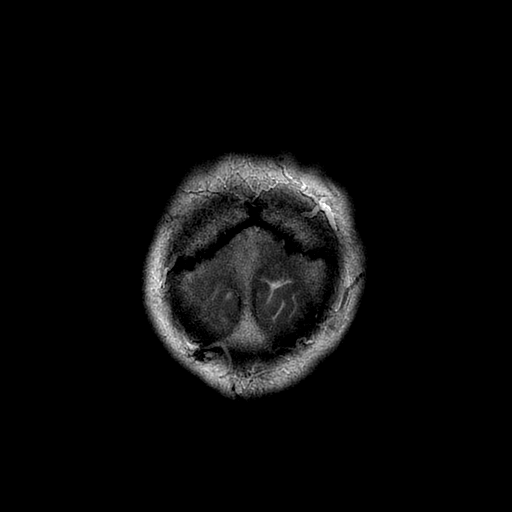
[im 30/30]
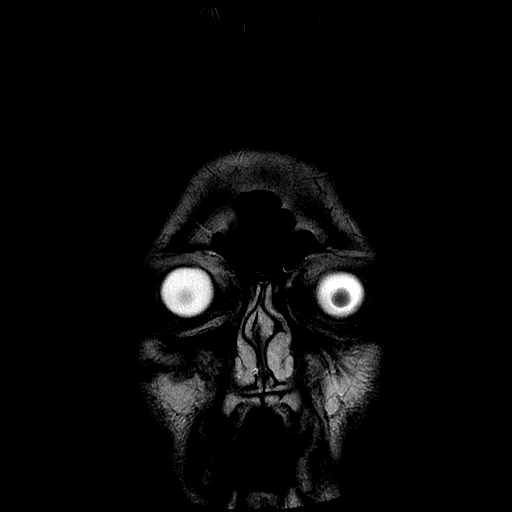

[Series 300: DWI · axial · 3.0mm · 1.09mm/px · z∈[-71,+64]mm · 4 of 46 slices shown (3 of 4)]
[im 1/46]
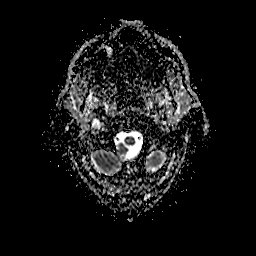
[im 16/46]
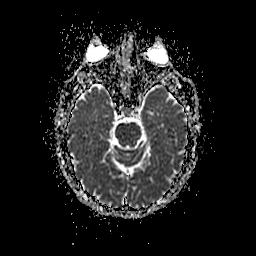
[im 31/46]
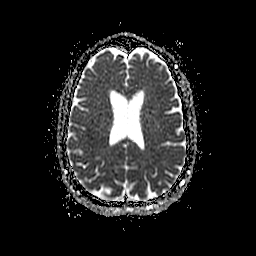
[im 46/46]
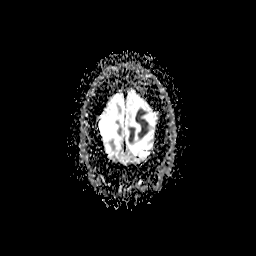

[Series 500: DWI · coronal · 5.0mm · 1.09mm/px · 3 of 33 slices shown (4 of 4)]
[im 1/33]
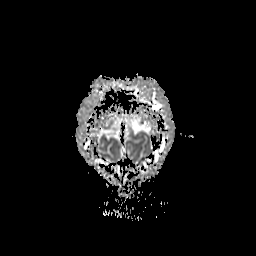
[im 17/33]
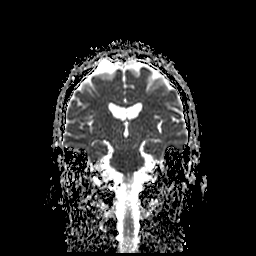
[im 33/33]
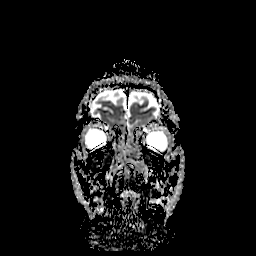

[30 of 48 positions shown; findings below may reference images not displayed]

FINDINGS: MRI HEAD FINDINGS

Cerebral volume within normal limits for patient age. No significant
white matter disease. Possible tiny remote right cerebellar infarct
noted.

No abnormal foci of restricted diffusion to suggest acute
intracranial infarct. Gray-white matter differentiation maintained.
Normal intravascular flow voids are preserved. No acute intracranial
hemorrhage. Single punctate chronic microhemorrhage noted within the
cortical gray matter of the anterior left frontal lobe (series 9,
image 18). This is of doubtful clinical significance.

No mass lesion, midline shift, or mass effect. No hydrocephalus. No
extra-axial fluid collection. Possible small arachnoid cyst at the
right cerebral convexity near the vertex (series 11, image 15).

Craniocervical junction within normal limits. Pituitary gland
normal.

No acute abnormality about the orbits.

Small retention cysts within the right sphenoid sinus. Scattered
mucosal thickening within the ethmoidal air cells. Small right
mastoid effusion noted. Inner ear structures grossly normal.

Bone marrow signal intensity within normal limits. No scalp soft
tissue abnormality.

MRA HEAD FINDINGS

ANTERIOR CIRCULATION:

Visualized distal cervical segments of the internal carotid arteries
are widely patent with antegrade flow. The petrous, cavernous, and
supraclinoid segments are widely patent. A1 segments, anterior
communicating artery common anterior cerebral arteries well
opacified. M1 segments widely patent without stenosis or occlusion.
MCA bifurcations within normal limits. Distal MCA branches well
opacified bilaterally.

POSTERIOR CIRCULATION:

Vertebral arteries are widely patent to the vertebrobasilar
junction. Posterior inferior cerebellar arteries patent bilaterally.
Basilar artery widely patent. Superior cerebellar arteries well
opacified. Right P1 and P2 segment widely patent. Fetal origin of
the left PCA with widely patent left posterior communicating artery.

Small 2 mm focal outpouching arising from the lateral aspect of the
cavernous right ICA, suspicious for possible small aneurysm (series
6, image 85).
IMPRESSION: MRI HEAD IMPRESSION:

Negative brain MRI with no acute intracranial infarct or other
process identified.

MRA HEAD IMPRESSION:

1. No large or proximal arterial branch occlusion within the
intracranial circulation. No significant intracranial atheromatous
disease or focal stenosis.
2. Question 2 mm focal outpouching arising from the cavernous right
ICA, which may reflect a small aneurysm. This projects laterally and
slightly posteriorly. Correlation with CTA of the head may be
helpful for corroboration of this finding as clinically desired.

## 2016-09-14 DIAGNOSIS — H6122 Impacted cerumen, left ear: Secondary | ICD-10-CM | POA: Diagnosis not present

## 2016-09-14 DIAGNOSIS — H9202 Otalgia, left ear: Secondary | ICD-10-CM | POA: Diagnosis not present

## 2016-10-01 DIAGNOSIS — M25521 Pain in right elbow: Secondary | ICD-10-CM | POA: Diagnosis not present

## 2016-10-01 DIAGNOSIS — L259 Unspecified contact dermatitis, unspecified cause: Secondary | ICD-10-CM | POA: Diagnosis not present

## 2016-10-01 DIAGNOSIS — N529 Male erectile dysfunction, unspecified: Secondary | ICD-10-CM | POA: Diagnosis not present

## 2016-10-01 DIAGNOSIS — M7701 Medial epicondylitis, right elbow: Secondary | ICD-10-CM | POA: Diagnosis not present

## 2016-10-24 DIAGNOSIS — I252 Old myocardial infarction: Secondary | ICD-10-CM | POA: Diagnosis not present

## 2016-10-24 DIAGNOSIS — Z8579 Personal history of other malignant neoplasms of lymphoid, hematopoietic and related tissues: Secondary | ICD-10-CM | POA: Diagnosis not present

## 2016-10-24 DIAGNOSIS — E890 Postprocedural hypothyroidism: Secondary | ICD-10-CM | POA: Diagnosis not present

## 2016-10-24 DIAGNOSIS — G6282 Radiation-induced polyneuropathy: Secondary | ICD-10-CM | POA: Diagnosis not present

## 2016-10-24 DIAGNOSIS — G893 Neoplasm related pain (acute) (chronic): Secondary | ICD-10-CM | POA: Diagnosis not present

## 2016-10-24 DIAGNOSIS — I10 Essential (primary) hypertension: Secondary | ICD-10-CM | POA: Diagnosis not present

## 2016-10-24 DIAGNOSIS — Z9221 Personal history of antineoplastic chemotherapy: Secondary | ICD-10-CM | POA: Diagnosis not present

## 2016-10-24 DIAGNOSIS — Z923 Personal history of irradiation: Secondary | ICD-10-CM | POA: Diagnosis not present

## 2016-10-24 DIAGNOSIS — Z85818 Personal history of malignant neoplasm of other sites of lip, oral cavity, and pharynx: Secondary | ICD-10-CM | POA: Diagnosis not present

## 2016-11-22 ENCOUNTER — Other Ambulatory Visit: Payer: Self-pay | Admitting: *Deleted

## 2016-11-22 MED ORDER — CARVEDILOL 6.25 MG PO TABS: 6.2500 mg | ORAL_TABLET | Freq: Two times a day (BID) | ORAL | 3 refills | Status: AC

## 2016-11-22 MED ORDER — RAMIPRIL 5 MG PO CAPS: 10.0000 mg | ORAL_CAPSULE | Freq: Every day | ORAL | 3 refills | Status: DC

## 2017-01-10 ENCOUNTER — Emergency Department (HOSPITAL_COMMUNITY): Payer: PPO

## 2017-01-10 ENCOUNTER — Inpatient Hospital Stay (HOSPITAL_COMMUNITY)
Admission: EM | Admit: 2017-01-10 | Discharge: 2017-01-14 | DRG: 287 | Disposition: A | Discharge status: Home / Self Care | Payer: PPO | Attending: Internal Medicine | Admitting: Internal Medicine

## 2017-01-10 ENCOUNTER — Encounter (HOSPITAL_COMMUNITY): Payer: Self-pay

## 2017-01-10 DIAGNOSIS — I25110 Atherosclerotic heart disease of native coronary artery with unstable angina pectoris: Secondary | ICD-10-CM | POA: Diagnosis present

## 2017-01-10 DIAGNOSIS — Z87891 Personal history of nicotine dependence: Secondary | ICD-10-CM | POA: Diagnosis not present

## 2017-01-10 DIAGNOSIS — I252 Old myocardial infarction: Secondary | ICD-10-CM

## 2017-01-10 DIAGNOSIS — F419 Anxiety disorder, unspecified: Secondary | ICD-10-CM | POA: Diagnosis present

## 2017-01-10 DIAGNOSIS — Z7901 Long term (current) use of anticoagulants: Secondary | ICD-10-CM | POA: Diagnosis not present

## 2017-01-10 DIAGNOSIS — Z85818 Personal history of malignant neoplasm of other sites of lip, oral cavity, and pharynx: Secondary | ICD-10-CM

## 2017-01-10 DIAGNOSIS — Z8249 Family history of ischemic heart disease and other diseases of the circulatory system: Secondary | ICD-10-CM | POA: Diagnosis not present

## 2017-01-10 DIAGNOSIS — I480 Paroxysmal atrial fibrillation: Secondary | ICD-10-CM | POA: Diagnosis present

## 2017-01-10 DIAGNOSIS — E785 Hyperlipidemia, unspecified: Secondary | ICD-10-CM | POA: Diagnosis present

## 2017-01-10 DIAGNOSIS — I10 Essential (primary) hypertension: Secondary | ICD-10-CM | POA: Diagnosis present

## 2017-01-10 DIAGNOSIS — I2510 Atherosclerotic heart disease of native coronary artery without angina pectoris: Secondary | ICD-10-CM | POA: Diagnosis not present

## 2017-01-10 DIAGNOSIS — C099 Malignant neoplasm of tonsil, unspecified: Secondary | ICD-10-CM

## 2017-01-10 DIAGNOSIS — I200 Unstable angina: Secondary | ICD-10-CM | POA: Diagnosis not present

## 2017-01-10 DIAGNOSIS — R05 Cough: Secondary | ICD-10-CM | POA: Diagnosis not present

## 2017-01-10 DIAGNOSIS — F329 Major depressive disorder, single episode, unspecified: Secondary | ICD-10-CM | POA: Diagnosis present

## 2017-01-10 DIAGNOSIS — R072 Precordial pain: Secondary | ICD-10-CM | POA: Diagnosis present

## 2017-01-10 DIAGNOSIS — I519 Heart disease, unspecified: Secondary | ICD-10-CM | POA: Diagnosis not present

## 2017-01-10 DIAGNOSIS — R079 Chest pain, unspecified: Secondary | ICD-10-CM | POA: Diagnosis not present

## 2017-01-10 DIAGNOSIS — Z720 Tobacco use: Secondary | ICD-10-CM | POA: Diagnosis not present

## 2017-01-10 DIAGNOSIS — R0789 Other chest pain: Secondary | ICD-10-CM | POA: Diagnosis not present

## 2017-01-10 DIAGNOSIS — E7800 Pure hypercholesterolemia, unspecified: Secondary | ICD-10-CM | POA: Diagnosis not present

## 2017-01-10 DIAGNOSIS — Z981 Arthrodesis status: Secondary | ICD-10-CM | POA: Diagnosis not present

## 2017-01-10 DIAGNOSIS — Z79899 Other long term (current) drug therapy: Secondary | ICD-10-CM

## 2017-01-10 DIAGNOSIS — Z955 Presence of coronary angioplasty implant and graft: Secondary | ICD-10-CM | POA: Diagnosis not present

## 2017-01-10 DIAGNOSIS — Z8673 Personal history of transient ischemic attack (TIA), and cerebral infarction without residual deficits: Secondary | ICD-10-CM

## 2017-01-10 DIAGNOSIS — Z79890 Hormone replacement therapy (postmenopausal): Secondary | ICD-10-CM | POA: Diagnosis not present

## 2017-01-10 DIAGNOSIS — Z85820 Personal history of malignant melanoma of skin: Secondary | ICD-10-CM

## 2017-01-10 DIAGNOSIS — Z9861 Coronary angioplasty status: Secondary | ICD-10-CM

## 2017-01-10 HISTORY — DX: Cerebral infarction, unspecified: I63.9

## 2017-01-10 LAB — CBC
HEMATOCRIT: 47.3 % (ref 39.0–52.0)
HEMOGLOBIN: 16.9 g/dL (ref 13.0–17.0)
MCH: 33.2 pg (ref 26.0–34.0)
MCHC: 35.7 g/dL (ref 30.0–36.0)
MCV: 92.9 fL (ref 78.0–100.0)
Platelets: 162 10*3/uL (ref 150–400)
RBC: 5.09 MIL/uL (ref 4.22–5.81)
RDW: 12.7 % (ref 11.5–15.5)
WBC: 6.8 10*3/uL (ref 4.0–10.5)

## 2017-01-10 LAB — APTT: APTT: 33 s (ref 24–36)

## 2017-01-10 LAB — BASIC METABOLIC PANEL
ANION GAP: 8 (ref 5–15)
BUN: 10 mg/dL (ref 6–20)
CHLORIDE: 107 mmol/L (ref 101–111)
CO2: 23 mmol/L (ref 22–32)
Calcium: 9.3 mg/dL (ref 8.9–10.3)
Creatinine, Ser: 1 mg/dL (ref 0.61–1.24)
GFR calc Af Amer: >60 mL/min (ref >60)
Glucose, Bld: 95 mg/dL (ref 65–99)
POTASSIUM: 4 mmol/L (ref 3.5–5.1)
SODIUM: 138 mmol/L (ref 135–145)

## 2017-01-10 LAB — PROTIME-INR
INR: 1.02
PROTHROMBIN TIME: 13.3 s (ref 11.4–15.2)

## 2017-01-10 LAB — CBG MONITORING, ED: Glucose-Capillary: 103 mg/dL — ABNORMAL HIGH (ref 65–99)

## 2017-01-10 LAB — I-STAT TROPONIN, ED
TROPONIN I, POC: 0.01 ng/mL (ref 0.00–0.08)
Troponin i, poc: 0.01 ng/mL (ref 0.00–0.08)

## 2017-01-10 MED ORDER — ACETAMINOPHEN 325 MG PO TABS: 650.0000 mg | ORAL_TABLET | Freq: Four times a day (QID) | ORAL | Status: DC | PRN

## 2017-01-10 MED ORDER — FENTANYL CITRATE (PF) 100 MCG/2ML IJ SOLN
50.0000 ug | Freq: Once | INTRAMUSCULAR | Status: AC
  Administered 2017-01-10: 50 ug via INTRAVENOUS
  Filled 2017-01-10: qty 2

## 2017-01-10 MED ORDER — SODIUM CHLORIDE 0.9% FLUSH
3.0000 mL | Freq: Two times a day (BID) | INTRAVENOUS | Status: DC
  Administered 2017-01-10 – 2017-01-13 (×3): 3 mL via INTRAVENOUS

## 2017-01-10 MED ORDER — HYDROCODONE-ACETAMINOPHEN 10-325 MG PO TABS
1.0000 | ORAL_TABLET | Freq: Four times a day (QID) | ORAL | Status: DC | PRN
  Administered 2017-01-10 – 2017-01-13 (×6): 1 via ORAL
  Filled 2017-01-10 (×6): qty 1

## 2017-01-10 MED ORDER — FENTANYL CITRATE (PF) 100 MCG/2ML IJ SOLN: 50.0000 ug | Freq: Once | INTRAMUSCULAR | Status: DC

## 2017-01-10 MED ORDER — ALPRAZOLAM 0.5 MG PO TABS
2.0000 mg | ORAL_TABLET | Freq: Every day | ORAL | Status: DC
  Administered 2017-01-10 – 2017-01-13 (×4): 2 mg via ORAL
  Filled 2017-01-10 (×4): qty 4

## 2017-01-10 MED ORDER — APIXABAN 5 MG PO TABS
5.0000 mg | ORAL_TABLET | Freq: Two times a day (BID) | ORAL | Status: DC
  Administered 2017-01-10 – 2017-01-11 (×2): 5 mg via ORAL
  Filled 2017-01-10 (×2): qty 1

## 2017-01-10 MED ORDER — LEVOTHYROXINE SODIUM 100 MCG PO TABS
100.0000 ug | ORAL_TABLET | Freq: Every day | ORAL | Status: DC
  Administered 2017-01-11 – 2017-01-14 (×4): 100 ug via ORAL
  Filled 2017-01-10 (×4): qty 1

## 2017-01-10 MED ORDER — TRAZODONE HCL 100 MG PO TABS
200.0000 mg | ORAL_TABLET | Freq: Every day | ORAL | Status: DC
  Administered 2017-01-10 – 2017-01-13 (×4): 200 mg via ORAL
  Filled 2017-01-10 (×5): qty 2

## 2017-01-10 MED ORDER — FENTANYL CITRATE (PF) 100 MCG/2ML IJ SOLN: 50.0000 ug | INTRAMUSCULAR | Status: DC | PRN

## 2017-01-10 MED ORDER — CITALOPRAM HYDROBROMIDE 20 MG PO TABS
30.0000 mg | ORAL_TABLET | Freq: Every day | ORAL | Status: DC
Start: 2017-01-12 — End: 2017-01-14
  Administered 2017-01-12 – 2017-01-14 (×3): 30 mg via ORAL
  Filled 2017-01-10 (×3): qty 3
  Filled 2017-01-10: qty 2

## 2017-01-10 MED ORDER — NITROGLYCERIN 2 % TD OINT
0.5000 [in_us] | TOPICAL_OINTMENT | Freq: Once | TRANSDERMAL | Status: AC
  Administered 2017-01-10: 0.5 [in_us] via TOPICAL
  Filled 2017-01-10: qty 1

## 2017-01-10 MED ORDER — PANTOPRAZOLE SODIUM 40 MG PO TBEC
40.0000 mg | DELAYED_RELEASE_TABLET | Freq: Two times a day (BID) | ORAL | Status: DC
  Administered 2017-01-10 – 2017-01-14 (×8): 40 mg via ORAL
  Filled 2017-01-10 (×8): qty 1

## 2017-01-10 MED ORDER — ACETAMINOPHEN 650 MG RE SUPP: 650.0000 mg | Freq: Four times a day (QID) | RECTAL | Status: DC | PRN

## 2017-01-10 MED ORDER — CITALOPRAM HYDROBROMIDE 10 MG PO TABS: 30.0000 mg | ORAL_TABLET | Freq: Every day | ORAL | Status: DC

## 2017-01-10 MED ORDER — ALPRAZOLAM 0.5 MG PO TABS
1.0000 mg | ORAL_TABLET | Freq: Every morning | ORAL | Status: DC
  Administered 2017-01-11 – 2017-01-14 (×4): 1 mg via ORAL
  Filled 2017-01-10 (×4): qty 2

## 2017-01-10 MED ORDER — SODIUM CHLORIDE 0.9% FLUSH: 3.0000 mL | INTRAVENOUS | Status: DC | PRN

## 2017-01-10 MED ORDER — CARVEDILOL 6.25 MG PO TABS
6.2500 mg | ORAL_TABLET | Freq: Two times a day (BID) | ORAL | Status: DC
  Administered 2017-01-10 – 2017-01-14 (×8): 6.25 mg via ORAL
  Filled 2017-01-10 (×8): qty 1

## 2017-01-10 MED ORDER — SODIUM CHLORIDE 0.9 % IV SOLN: 250.0000 mL | INTRAVENOUS | Status: DC | PRN

## 2017-01-10 MED ORDER — ACETAMINOPHEN 325 MG PO TABS
650.0000 mg | ORAL_TABLET | Freq: Once | ORAL | Status: AC
  Administered 2017-01-10: 650 mg via ORAL
  Filled 2017-01-10: qty 2

## 2017-01-10 MED ORDER — RAMIPRIL 10 MG PO CAPS
10.0000 mg | ORAL_CAPSULE | Freq: Every day | ORAL | Status: DC
  Administered 2017-01-10 – 2017-01-13 (×4): 10 mg via ORAL
  Filled 2017-01-10 (×5): qty 1
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
## 2017-01-10 NOTE — ED Triage Notes (Signed)
Pt arrives to ED from PCP via EMS with complaints of Cp x1 month with Sob x3 days. Pt states he has significant cardiac hx, including "normal enzymes with every test and normal EKG with every heart attack". Pt placed in position of comfort with bed locked and lowered, call bell in reach.

## 2017-01-11 DIAGNOSIS — Z85818 Personal history of malignant neoplasm of other sites of lip, oral cavity, and pharynx: Secondary | ICD-10-CM | POA: Diagnosis not present

## 2017-01-11 DIAGNOSIS — Z79890 Hormone replacement therapy (postmenopausal): Secondary | ICD-10-CM | POA: Diagnosis not present

## 2017-01-11 DIAGNOSIS — F419 Anxiety disorder, unspecified: Secondary | ICD-10-CM | POA: Diagnosis present

## 2017-01-11 DIAGNOSIS — Z79899 Other long term (current) drug therapy: Secondary | ICD-10-CM | POA: Diagnosis not present

## 2017-01-11 DIAGNOSIS — Z981 Arthrodesis status: Secondary | ICD-10-CM | POA: Diagnosis not present

## 2017-01-11 DIAGNOSIS — E785 Hyperlipidemia, unspecified: Secondary | ICD-10-CM | POA: Diagnosis present

## 2017-01-11 DIAGNOSIS — R072 Precordial pain: Secondary | ICD-10-CM | POA: Diagnosis present

## 2017-01-11 DIAGNOSIS — I252 Old myocardial infarction: Secondary | ICD-10-CM | POA: Diagnosis not present

## 2017-01-11 DIAGNOSIS — I480 Paroxysmal atrial fibrillation: Secondary | ICD-10-CM | POA: Diagnosis present

## 2017-01-11 DIAGNOSIS — Z9861 Coronary angioplasty status: Secondary | ICD-10-CM | POA: Diagnosis not present

## 2017-01-11 DIAGNOSIS — I200 Unstable angina: Secondary | ICD-10-CM | POA: Diagnosis not present

## 2017-01-11 DIAGNOSIS — Z8249 Family history of ischemic heart disease and other diseases of the circulatory system: Secondary | ICD-10-CM | POA: Diagnosis not present

## 2017-01-11 DIAGNOSIS — E7800 Pure hypercholesterolemia, unspecified: Secondary | ICD-10-CM | POA: Diagnosis not present

## 2017-01-11 DIAGNOSIS — Z8673 Personal history of transient ischemic attack (TIA), and cerebral infarction without residual deficits: Secondary | ICD-10-CM | POA: Diagnosis not present

## 2017-01-11 DIAGNOSIS — I2510 Atherosclerotic heart disease of native coronary artery without angina pectoris: Secondary | ICD-10-CM | POA: Diagnosis not present

## 2017-01-11 DIAGNOSIS — R079 Chest pain, unspecified: Secondary | ICD-10-CM | POA: Diagnosis not present

## 2017-01-11 DIAGNOSIS — F329 Major depressive disorder, single episode, unspecified: Secondary | ICD-10-CM | POA: Diagnosis present

## 2017-01-11 DIAGNOSIS — Z87891 Personal history of nicotine dependence: Secondary | ICD-10-CM | POA: Diagnosis not present

## 2017-01-11 DIAGNOSIS — Z955 Presence of coronary angioplasty implant and graft: Secondary | ICD-10-CM | POA: Diagnosis not present

## 2017-01-11 DIAGNOSIS — Z85820 Personal history of malignant melanoma of skin: Secondary | ICD-10-CM | POA: Diagnosis not present

## 2017-01-11 DIAGNOSIS — I10 Essential (primary) hypertension: Secondary | ICD-10-CM | POA: Diagnosis present

## 2017-01-11 DIAGNOSIS — Z7901 Long term (current) use of anticoagulants: Secondary | ICD-10-CM | POA: Diagnosis not present

## 2017-01-11 DIAGNOSIS — I25110 Atherosclerotic heart disease of native coronary artery with unstable angina pectoris: Secondary | ICD-10-CM | POA: Diagnosis present

## 2017-01-11 LAB — HIV ANTIBODY (ROUTINE TESTING W REFLEX): HIV Screen 4th Generation wRfx: NONREACTIVE

## 2017-01-11 LAB — TROPONIN I
Troponin I: <0.03 ng/mL (ref <0.03)
Troponin I: <0.03 ng/mL (ref <0.03)

## 2017-01-11 MED ORDER — SODIUM CHLORIDE 0.9 % IV SOLN: 250.0000 mL | INTRAVENOUS | Status: DC | PRN

## 2017-01-11 MED ORDER — SODIUM CHLORIDE 0.9 % WEIGHT BASED INFUSION
1.0000 mL/kg/h | INTRAVENOUS | Status: DC
  Administered 2017-01-13 – 2017-01-14 (×2): 1 mL/kg/h via INTRAVENOUS

## 2017-01-11 MED ORDER — SODIUM CHLORIDE 0.9 % WEIGHT BASED INFUSION: 3.0000 mL/kg/h | INTRAVENOUS | Status: DC

## 2017-01-11 MED ORDER — HEPARIN (PORCINE) IN NACL 100-0.45 UNIT/ML-% IJ SOLN
1100.0000 [IU]/h | INTRAMUSCULAR | Status: DC
  Administered 2017-01-11: 1300 [IU]/h via INTRAVENOUS
  Administered 2017-01-12: 1200 [IU]/h via INTRAVENOUS
  Filled 2017-01-11 (×4): qty 250

## 2017-01-11 MED ORDER — SODIUM CHLORIDE 0.9% FLUSH: 3.0000 mL | INTRAVENOUS | Status: DC | PRN

## 2017-01-11 MED ORDER — SODIUM CHLORIDE 0.9% FLUSH: 3.0000 mL | Freq: Two times a day (BID) | INTRAVENOUS | Status: DC

## 2017-01-11 MED ORDER — HEPARIN BOLUS VIA INFUSION
4000.0000 [IU] | Freq: Once | INTRAVENOUS | Status: AC
  Administered 2017-01-11: 4000 [IU] via INTRAVENOUS
  Filled 2017-01-11: qty 4000

## 2017-01-11 MED ORDER — ASPIRIN 81 MG PO CHEW
81.0000 mg | CHEWABLE_TABLET | ORAL | Status: AC
  Administered 2017-01-14: 81 mg via ORAL

## 2017-01-11 MED ORDER — NITROGLYCERIN 0.4 MG SL SUBL: 0.4000 mg | SUBLINGUAL_TABLET | SUBLINGUAL | Status: DC | PRN

## 2017-01-11 NOTE — Progress Notes (Signed)
PROGRESS NOTE   Bobby Floyd  HER:[id number]    DOB: 09-27-1960    DOA: 01/10/2017  PCP: Melony Overly, MD   I have briefly reviewed patients previous medical records in [facility].  Brief Narrative:  56 year old male with PMH of tonsillar cancer, treated and in remission, CAD status post multiple stents, depression, anxiety, TIA who presented to ED with 3 weeks history of progressive dyspnea on exertion and 3-5 days history of exertional chest pain. The symptoms are similar to his previous episodes of angina and he has strong family history of CAD and personal history of premature CAD. Cardiology consulted and since he received Eliquis up to this morning, plan for cardiac cath and possible PCI on 11/5 unless he becomes unstable over the weekend when May have to do it emergently.   Assessment & Plan:   Principal Problem:   Chest pain Active Problems:   History of Tonsillar cancer, stage IV squamous cell left tonsil   Hypercholesterolemia   Tobacco use   Coronary Artery Disease   S/P coronary artery stent placement - DES x 2 to dominant CFX and DES to LAD 11/10/2013   Paroxysmal atrial fibrillation (HCC)   Essential hypertension   Unstable angina - Several weeks history of ongoing dyspnea on exertion and exertional chest pain similar to prior episodes of angina. Strong family and personal history of CAD. - EKG without acute changes and troponin 2 negative. - Cardiology consultation appreciated. Since he received Eliquis up to this morning, have to postpone cardiac catheter to 11/5 Eliquis stopped. IV Heparin infusion started.  PAF - Currently in sinus rhythm. - CHADSVASC of at least 4 (HTN, vasc dz, TIA).  - Eliquis held for cath and IV Heparin started. Continue beta blockers as BP tolerates. May have to cut back on those.  Essential hypertension - Soft blood pressures this morning. Added holding parameters. If persists, may have to reduce dose of beta blockers  and stop ramipril. Discussed with RN.  Hyperlipidemia Intolerant to statins. Continue Repatha  Anxiety/depression Stable. Continue home medications.  History of TIA 2016. Anticoagulation discussion as above.  History of tonsillar carcinoma and melanoma In remission.   DVT prophylaxis: IV heparin infusion Code Status: Full Family Communication: None at bedside Disposition: DC home when medically improved.   Consultants:  Cardiology   Procedures:  None  Antimicrobials:  None    Subjective: Reports ongoing intermittent chest pain but none during my visit this morning. No dyspnea reported.   ROS: No palpitations, dizziness or lightheadedness.  Objective:  Vitals:   01/10/17 2045 01/10/17 2217 01/10/17 2225 01/11/17 0405  BP: 137/82 (!) 173/97  (!) 84/59  Pulse: 67 70  69  Resp: 16 13  17   Temp:  97.9 F (36.6 C)  97.9 F (36.6 C)  TempSrc:  Oral  Oral  SpO2: 97% 96%  98%  Weight:   93.4 kg (205 lb 14.4 oz)   Height:   6\' 2"  (1.88 m)     Examination:  General exam: Middle-aged male, moderately built and nourished, lying comfortably supine in bed. Respiratory system: Clear to auscultation. Respiratory effort normal. Cardiovascular system: S1 & S2 heard, RRR. No JVD, murmurs, rubs, gallops or clicks. No pedal edema. Telemetry: SB in the 50's-SR Gastrointestinal system: Abdomen is nondistended, soft and nontender. No organomegaly or masses felt. Normal bowel sounds heard. Central nervous system: Alert and oriented. No focal neurological deficits. Extremities: Symmetric 5 x 5 power. Skin: No rashes, lesions or ulcers  Psychiatry: Judgement and insight appear normal. Mood & affect appropriate.     Data Reviewed: I have personally reviewed following labs and imaging studies  CBC:  Recent Labs Lab 01/10/17 1637  WBC 6.8  HGB 16.9  HCT 47.3  MCV 92.9  PLT 482   Basic Metabolic Panel:  Recent Labs Lab 01/10/17 1637  NA 138  K 4.0  CL 107  CO2 23    GLUCOSE 95  BUN 10  CREATININE 1.00  CALCIUM 9.3   Liver Function Tests: No results for input(s): AST, ALT, ALKPHOS, BILITOT, PROT, ALBUMIN in the last 168 hours. Coagulation Profile:  Recent Labs Lab 01/10/17 1637  INR 1.02   Cardiac Enzymes:  Recent Labs Lab 01/11/17 0200 01/11/17 0640  TROPONINI <0.03 <0.03   HbA1C: No results for input(s): HGBA1C in the last 72 hours. CBG:  Recent Labs Lab 01/10/17 1722  GLUCAP 103*    No results found for this or any previous visit (from the past 240 hour(s)).       Radiology Studies: Dg Chest 2 View  Result Date: 01/10/2017 CLINICAL DATA:  56 y/o  M; chest pain and cough. EXAM: CHEST  2 VIEW COMPARISON:  08/10/2015 chest radiograph FINDINGS: Stable heart size and mediastinal contours are within normal limits given projection and technique. Both lungs are clear. No pleural effusion or pneumothorax. Partially visualized cervical fusion hardware. No acute osseous abnormality is evident. IMPRESSION: No acute pulmonary process identified. Electronically Signed   By: Kristine Garbe M.D.   On: 01/10/2017 17:22        Scheduled Meds: . ALPRAZolam  1 mg Oral q morning - 10a  . ALPRAZolam  2 mg Oral QHS  . carvedilol  6.25 mg Oral BID  . [START ON 01/12/2017] citalopram  30 mg Oral Q breakfast  . heparin  4,000 Units Intravenous Once  . levothyroxine  100 mcg Oral QAC breakfast  . pantoprazole  40 mg Oral BID  . ramipril  10 mg Oral QHS  . sodium chloride flush  3 mL Intravenous Q12H  . traZODone  200 mg Oral QHS   Continuous Infusions: . sodium chloride    . heparin       LOS: 0 days     [name], MD, FACP, FHM. [facility] Pager [phone]  If 7PM-7AM, please contact night-coverage [email or web address] Password [email or web address] 01/11/2017, 3:09 PM
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
## 2017-01-11 NOTE — Progress Notes (Signed)
Patient's b/p 85/60 automatic. Same range with manual. Notified on call. Patient asymptomatic. Will continue to monitor
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
## 2017-01-12 DIAGNOSIS — I2510 Atherosclerotic heart disease of native coronary artery without angina pectoris: Secondary | ICD-10-CM

## 2017-01-12 DIAGNOSIS — I200 Unstable angina: Secondary | ICD-10-CM

## 2017-01-12 DIAGNOSIS — I10 Essential (primary) hypertension: Secondary | ICD-10-CM

## 2017-01-12 DIAGNOSIS — Z9861 Coronary angioplasty status: Secondary | ICD-10-CM

## 2017-01-12 DIAGNOSIS — E7800 Pure hypercholesterolemia, unspecified: Secondary | ICD-10-CM

## 2017-01-12 LAB — HEPARIN LEVEL (UNFRACTIONATED): Heparin Unfractionated: 1.06 IU/mL — ABNORMAL HIGH (ref 0.30–0.70)

## 2017-01-12 LAB — APTT
APTT: 87 s — AB (ref 24–36)
aPTT: 145 seconds — ABNORMAL HIGH (ref 24–36)

## 2017-01-12 LAB — LIPID PANEL
Cholesterol: 114 mg/dL (ref 0–200)
HDL: 43 mg/dL (ref >40)
LDL CALC: 49 mg/dL (ref 0–99)
TRIGLYCERIDES: 111 mg/dL (ref <150)
Total CHOL/HDL Ratio: 2.7 RATIO
VLDL: 22 mg/dL (ref 0–40)

## 2017-01-12 LAB — CBC
HCT: 45.6 % (ref 39.0–52.0)
Hemoglobin: 15.6 g/dL (ref 13.0–17.0)
MCH: 32.3 pg (ref 26.0–34.0)
MCHC: 34.2 g/dL (ref 30.0–36.0)
MCV: 94.4 fL (ref 78.0–100.0)
PLATELETS: 141 10*3/uL — AB (ref 150–400)
RBC: 4.83 MIL/uL (ref 4.22–5.81)
RDW: 12.5 % (ref 11.5–15.5)
WBC: 5.3 10*3/uL (ref 4.0–10.5)

## 2017-01-12 NOTE — Progress Notes (Signed)
PROGRESS NOTE   Bobby Floyd  GYJ:[id number]    DOB: Jun 29, 1960    DOA: 01/10/2017  PCP: Melony Overly, MD   I have briefly reviewed patients previous medical [facility].  Brief Narrative:  56 year old male with PMH of tonsillar cancer, treated and in remission, CAD status post multiple stents, depression, anxiety, TIA who presented to ED with 3 weeks history of progressive dyspnea on exertion and 3-5 days history of exertional chest pain. The symptoms are similar to his previous episodes of angina and he has strong family history of CAD and personal history of premature CAD. Cardiology consulted and since he received Eliquis up to this morning, plan for cardiac cath and possible PCI on 11/5 unless he becomes unstable over the weekend when May have to do it emergently.   Assessment & Plan:   Principal Problem:   Chest pain Active Problems:   History of Tonsillar cancer, stage IV squamous cell left tonsil   Hypercholesterolemia   Tobacco use   Coronary Artery Disease   S/P coronary artery stent placement - DES x 2 to dominant CFX and DES to LAD 11/10/2013   Paroxysmal atrial fibrillation (HCC)   Essential hypertension   Unstable angina - Several weeks history of ongoing dyspnea on exertion and exertional chest pain similar to prior episodes of angina. Strong family and personal history of CAD. - EKG without acute changes and troponin 2 negative. - Cardiology consultation appreciated. Since he received Eliquis up to this morning, have to postpone cardiac catheter to 11/5 Eliquis stopped. IV Heparin infusion started. -Ongoing intermittent mild chest pain rated at 2-4/10 in severity, mostly brought on by certain activities such as shower or ambulating much.  Advised patient to follow bedrest with bathroom privileges and not exert himself much.  He verbalized understanding.  PAF - Currently in sinus rhythm. - CHADSVASC of at least 4 (HTN, vasc dz, TIA).  -  Eliquis held for cath and IV Heparin started. Continue beta blockers as BP tolerates. May have to cut back on those.  Essential hypertension -Blood pressures were soft on 11/2 but better today.  Continue to monitor.  Hyperlipidemia Intolerant to statins. Continue Repatha  Anxiety/depression Stable. Continue home medications.  History of TIA 2016. Anticoagulation discussion as above.  History of tonsillar carcinoma and melanoma In remission.  Mild thrombocytopenia -On review of prior labs, has had intermittent mild thrombocytopenia in the past.  Currently on IV heparin.  Monitor CBC closely.  No bleeding reported.   DVT prophylaxis: IV heparin infusion Code Status: Full Family Communication: None at bedside Disposition: DC home when medically improved.   Consultants:  Cardiology   Procedures:  None  Antimicrobials:  None    Subjective: Ongoing intermittent mild chest pain rated at 2-4/10 in severity, mostly brought on by certain activities such as shower or ambulating much.   ROS: No palpitations, dizziness or lightheadedness.  Objective:  Vitals:   01/11/17 0405 01/11/17 2230 01/12/17 0655 01/12/17 0700  BP: (!) 84/59 113/77 120/83 120/83  Pulse: 69  62   Resp: 17 16 18 14   Temp: 97.9 F (36.6 C) (!) 97.5 F (36.4 C) (!) 97.4 F (36.3 C)   TempSrc: Oral Oral Oral   SpO2: 98% 96% 98%   Weight:      Height:        Examination:  General exam: Middle-aged male, moderately built and nourished, lying comfortably supine in bed.  Does not appear in any distress. Respiratory system:  Clear to auscultation. Respiratory effort normal.  Stable. Cardiovascular system: S1 & S2 heard, RRR. No JVD, murmurs, rubs, gallops or clicks. No pedal edema.  No change/stable. Gastrointestinal system: Abdomen is nondistended, soft and nontender. No organomegaly or masses felt. Normal bowel sounds heard. Central nervous system: Alert and oriented. No focal neurological  deficits. Extremities: Symmetric 5 x 5 power. Skin: No rashes, lesions or ulcers Psychiatry: Judgement and insight appear normal. Mood & affect appropriate.     Data Reviewed: I have personally reviewed following labs and imaging studies  CBC:  Recent Labs Lab 01/10/17 1637 01/12/17 0425  WBC 6.8 5.3  HGB 16.9 15.6  HCT 47.3 45.6  MCV 92.9 94.4  PLT 162 031*   Basic Metabolic Panel:  Recent Labs Lab 01/10/17 1637  NA 138  K 4.0  CL 107  CO2 23  GLUCOSE 95  BUN 10  CREATININE 1.00  CALCIUM 9.3   Liver Function Tests: No results for input(s): AST, ALT, ALKPHOS, BILITOT, PROT, ALBUMIN in the last 168 hours. Coagulation Profile:  Recent Labs Lab 01/10/17 1637  INR 1.02   Cardiac Enzymes:  Recent Labs Lab 01/11/17 0200 01/11/17 0640  TROPONINI <0.03 <0.03   HbA1C: No results for input(s): HGBA1C in the last 72 hours. CBG:  Recent Labs Lab 01/10/17 1722  GLUCAP 103*    No results found for this or any previous visit (from the past 240 hour(s)).       Radiology Studies: Dg Chest 2 View  Result Date: 01/10/2017 CLINICAL DATA:  56 y/o  M; chest pain and cough. EXAM: CHEST  2 VIEW COMPARISON:  08/10/2015 chest radiograph FINDINGS: Stable heart size and mediastinal contours are within normal limits given projection and technique. Both lungs are clear. No pleural effusion or pneumothorax. Partially visualized cervical fusion hardware. No acute osseous abnormality is evident. IMPRESSION: No acute pulmonary process identified. Electronically Signed   By: Kristine Garbe M.D.   On: 01/10/2017 17:22        Scheduled Meds: . ALPRAZolam  1 mg Oral q morning - 10a  . ALPRAZolam  2 mg Oral QHS  . aspirin  81 mg Oral Pre-Cath  . carvedilol  6.25 mg Oral BID  . citalopram  30 mg Oral Q breakfast  . levothyroxine  100 mcg Oral QAC breakfast  . pantoprazole  40 mg Oral BID  . ramipril  10 mg Oral QHS  . sodium chloride flush  3 mL Intravenous  Q12H  . sodium chloride flush  3 mL Intravenous Q12H  . traZODone  200 mg Oral QHS   Continuous Infusions: . sodium chloride    . sodium chloride    . [START ON 01/13/2017] sodium chloride    . heparin 1,200 Units/hr (01/12/17 5945)     LOS: 1 day     Taronda Comacho, MD, FACP, FHM. [facility] Pager [phone]  If 7PM-7AM, please contact night-coverage [email or web address] Password [email or web address] 01/12/2017, 10:33 AM

## 2017-01-12 NOTE — Progress Notes (Signed)
ANTICOAGULATION CONSULT NOTE - Follow Up Consult  Pharmacy Consult for heparin Indication: chest pain/ACS and atrial fibrillation   Labs:  Recent Labs  01/10/17 1637 01/11/17 0200 01/11/17 0640 01/12/17 0425  HGB 16.9  --   --  15.6  HCT 47.3  --   --  45.6  PLT 162  --   --  141*  APTT 33  --   --  145*  LABPROT 13.3  --   --   --   INR 1.02  --   --   --   HEPARINUNFRC  --   --   --  1.06*  CREATININE 1.00  --   --   --   TROPONINI  --  <0.03 <0.03  --     Assessment: 56yo male above goal on heparin with initial dosing while Eliquis on hold though lab was drawn on the same arm as heparin is infusing, >2in below IV site but could still slightly affect labs.  Goal of Therapy:  aPTT 66-102 seconds   Plan:  Will decrease heparin gtt slightly to 1200 units/hr and check PTT in 6hr.  Wynona Neat, PharmD, BCPS  01/12/2017,6:52 AM

## 2017-01-12 NOTE — Plan of Care (Signed)
Problem: Pain Managment: Goal: General experience of comfort will improve Outcome: Progressing Pain management discussed with pt.

## 2017-01-12 NOTE — Progress Notes (Signed)
ANTICOAGULATION CONSULT NOTE - Follow Up Consult  Pharmacy Consult for Heparin Indication: chest pain/ACS  Allergies  Allergen Reactions  . Lipitor [Atorvastatin] Swelling and Other (See Comments)    Other reaction(s): Other (See Comments) Urination problems, myalgias also Urination problems, myalgias also  . Other Other (See Comments)    Possible resistance to all narcotic pain meds-dilaudid might work  . Propofol Other (See Comments)    "violent"  . Benadryl [Diphenhydramine Hcl] Swelling    Hyperactivity, very   . Ondansetron Other (See Comments)    Headaches  . Promethazine Hcl Nausea And Vomiting  . Morphine And Related     Really bad headaches and his bp go up really high     Patient Measurements: Height: 6\' 2"  (188 cm) Weight: 205 lb 14.4 oz (93.4 kg) IBW/kg (Calculated) : 82.2 Heparin Dosing Weight:  93.4 kg  Vital Signs: Temp: 98 F (36.7 C) (11/03 1354) Temp Source: Oral (11/03 1354) BP: 100/66 (11/03 1354) Pulse Rate: 66 (11/03 1354)  Labs:  Recent Labs  01/10/17 1637 01/11/17 0200 01/11/17 0640 01/12/17 0425 01/12/17 1504  HGB 16.9  --   --  15.6  --   HCT 47.3  --   --  45.6  --   PLT 162  --   --  141*  --   APTT 33  --   --  145* 87*  LABPROT 13.3  --   --   --   --   INR 1.02  --   --   --   --   HEPARINUNFRC  --   --   --  1.06*  --   CREATININE 1.00  --   --   --   --   TROPONINI  --  <0.03 <0.03  --   --     Estimated Creatinine Clearance: 98.2 mL/min (by C-G formula based on SCr of 1 mg/dL).   Assessment:  Anticoag: Eliquis for h/o afib and TIA 2016 says PTA but INR 1.02 (should be high if compliant?) . CBC WNL. Eliquis last dose 11/2 0830. Eliquis now on hold>>IV heparin for cath Monday. CHADSVASC of at least 4 (HTN, vasc dz, TIA - 11/3 AM:  HL 1.06, PTT 145, drawn from hand same side as heparin running in [facility] so should be far enough below but could still affect it some, - 11/3 PM: aptt 87 now in goal range.    Goal of Therapy:   aPTT 66-102 seconds Monitor platelets by anticoagulation protocol: Yes   Plan:  Hold Eliquis. Needs cath Monday. IV heparin 1200 units/hr Daily aPTT, HL, and CBC  Bobby Floyd, PharmD, BCPS Clinical Staff Pharmacist Pager [phone]  Bobby Floyd 01/12/2017,4:00 PM

## 2017-01-12 NOTE — Progress Notes (Signed)
Progress Note  Patient Name: Bobby Floyd Date of Encounter: 01/12/2017  Primary Cardiologist: Dr. Marlou Porch  Subjective   Complains of intermittent chest pressure but dose not want NTG or morphine  Inpatient Medications    Scheduled Meds: . ALPRAZolam  1 mg Oral q morning - 10a  . ALPRAZolam  2 mg Oral QHS  . aspirin  81 mg Oral Pre-Cath  . carvedilol  6.25 mg Oral BID  . citalopram  30 mg Oral Q breakfast  . levothyroxine  100 mcg Oral QAC breakfast  . pantoprazole  40 mg Oral BID  . ramipril  10 mg Oral QHS  . sodium chloride flush  3 mL Intravenous Q12H  . sodium chloride flush  3 mL Intravenous Q12H  . traZODone  200 mg Oral QHS   Continuous Infusions: . sodium chloride    . sodium chloride    . [START ON 01/13/2017] sodium chloride    . heparin 1,200 Units/hr (01/12/17 3646)   PRN Meds: sodium chloride, sodium chloride, acetaminophen **OR** acetaminophen, fentaNYL (SUBLIMAZE) injection, HYDROcodone-acetaminophen, nitroGLYCERIN, sodium chloride flush, sodium chloride flush   Vital Signs    Vitals:   01/11/17 0405 01/11/17 2230 01/12/17 0655 01/12/17 0700  BP: (!) 84/59 113/77 120/83 120/83  Pulse: 69  62   Resp: 17 16 18 14   Temp: 97.9 F (36.6 C) (!) 97.5 F (36.4 C) (!) 97.4 F (36.3 C)   TempSrc: Oral Oral Oral   SpO2: 98% 96% 98%   Weight:      Height:        Intake/Output Summary (Last 24 hours) at 01/12/17 1219 Last data filed at 01/12/17 0800  Gross per 24 hour  Intake              383 ml  Output                0 ml  Net              383 ml   Filed Weights   01/10/17 1600 01/10/17 2225  Weight: 212 lb (96.2 kg) 205 lb 14.4 oz (93.4 kg)    Telemetry    NSR - Personally Reviewed  ECG    No new EKG to review - Personally Reviewed  Physical Exam   GEN: No acute distress.   Neck: No JVD Cardiac: RRR, no murmurs, rubs, or gallops.  Respiratory: Clear to auscultation bilaterally. GI: Soft, nontender, non-distended  MS: No edema;  No deformity. Neuro:  Nonfocal  Psych: Normal affect   Labs    Chemistry Recent Labs Lab 01/10/17 1637  NA 138  K 4.0  CL 107  CO2 23  GLUCOSE 95  BUN 10  CREATININE 1.00  CALCIUM 9.3  GFRNONAA >60  GFRAA >60  ANIONGAP 8     Hematology Recent Labs Lab 01/10/17 1637 01/12/17 0425  WBC 6.8 5.3  RBC 5.09 4.83  HGB 16.9 15.6  HCT 47.3 45.6  MCV 92.9 94.4  MCH 33.2 32.3  MCHC 35.7 34.2  RDW 12.7 12.5  PLT 162 141*    Cardiac Enzymes Recent Labs Lab 01/11/17 0200 01/11/17 0640  TROPONINI <0.03 <0.03    Recent Labs Lab 01/10/17 1705 01/10/17 1904  TROPIPOC 0.01 0.01     BNPNo results for input(s): BNP, PROBNP in the last 168 hours.   DDimer No results for input(s): DDIMER in the last 168 hours.   Radiology    Dg Chest 2 View  Result Date: 01/10/2017  CLINICAL DATA:  56 y/o  M; chest pain and cough. EXAM: CHEST  2 VIEW COMPARISON:  08/10/2015 chest radiograph FINDINGS: Stable heart size and mediastinal contours are within normal limits given projection and technique. Both lungs are clear. No pleural effusion or pneumothorax. Partially visualized cervical fusion hardware. No acute osseous abnormality is evident. IMPRESSION: No acute pulmonary process identified. Electronically Signed   By: Kristine Garbe M.D.   On: 01/10/2017 17:22    Cardiac Studies    None  Patient Profile     56 y.o. male with a hx of CAD S/P MI with multiple stents (2004, 2007, 2013), PAF on Eliquis, HLD, HTN, GERD, tonsillar cancer in remission, depression/anxiety and TIA who presented with complaints of chest pain.   Assessment & Plan    USAP: Pt has a long history of CAD with multiple stents, beginning at age 56. Last intervention was in 11/2013 with DES x 2 to the LCx and DES to LAD. Admitted with 2 week h/o CP and shortness of breath with pain radiating to left arm, profuse diaphoresis and fatigue. - Trop negative x 4 - No cath yesterday due to Eliquis -awaiting  Eliquis washout - CP c/w prior angina - plan for L heart cath Monday - continue IV heparin drip, aspirin, beta-blocker  PAF: Maintaining sinus rhythm on BB.  - CHADSVASC of at least 4 (HTN, vasc dz, TIA). -Eliquis has been stopped and patient now on IV heparin awaiting cath  Hypertension: BP well controlled -Continue carvedilol 6.25 mg twice daily and Altace 10 mg daily  Hyperlipidemia: Statin intolerant  - Last lipid panel in 02/2016 showed LDL 122.  - Repeat LDL for now 49 on Repatha     For questions or updates, please contact [place] Please consult [email or web address] for contact info under Cardiology/STEMI.      Signed, Fransico Him, MD  01/12/2017, 12:19 PM

## 2017-01-13 ENCOUNTER — Other Ambulatory Visit: Payer: Self-pay

## 2017-01-13 DIAGNOSIS — I480 Paroxysmal atrial fibrillation: Secondary | ICD-10-CM

## 2017-01-13 LAB — BASIC METABOLIC PANEL
ANION GAP: 4 — AB (ref 5–15)
BUN: 10 mg/dL (ref 6–20)
CHLORIDE: 108 mmol/L (ref 101–111)
CO2: 25 mmol/L (ref 22–32)
Calcium: 9.1 mg/dL (ref 8.9–10.3)
Creatinine, Ser: 1 mg/dL (ref 0.61–1.24)
GFR calc non Af Amer: >60 mL/min (ref >60)
Glucose, Bld: 99 mg/dL (ref 65–99)
Potassium: 4.1 mmol/L (ref 3.5–5.1)
Sodium: 137 mmol/L (ref 135–145)

## 2017-01-13 LAB — CBC
HCT: 43.5 % (ref 39.0–52.0)
HEMOGLOBIN: 15.1 g/dL (ref 13.0–17.0)
MCH: 32.9 pg (ref 26.0–34.0)
MCHC: 34.7 g/dL (ref 30.0–36.0)
MCV: 94.8 fL (ref 78.0–100.0)
Platelets: 142 10*3/uL — ABNORMAL LOW (ref 150–400)
RBC: 4.59 MIL/uL (ref 4.22–5.81)
RDW: 12.6 % (ref 11.5–15.5)
WBC: 5.4 10*3/uL (ref 4.0–10.5)

## 2017-01-13 LAB — HEPARIN LEVEL (UNFRACTIONATED)
Heparin Unfractionated: 0.28 IU/mL — ABNORMAL LOW (ref 0.30–0.70)
Heparin Unfractionated: 0.6 IU/mL (ref 0.30–0.70)

## 2017-01-13 LAB — APTT
aPTT: 43 seconds — ABNORMAL HIGH (ref 24–36)
aPTT: 94 seconds — ABNORMAL HIGH (ref 24–36)

## 2017-01-13 MED ORDER — ASPIRIN 81 MG PO CHEW
81.0000 mg | CHEWABLE_TABLET | Freq: Every day | ORAL | Status: DC
Start: 2017-01-13 — End: 2017-01-14
  Administered 2017-01-13: 81 mg via ORAL
  Filled 2017-01-13: qty 1

## 2017-01-13 MED ORDER — ASPIRIN 81 MG PO CHEW: 81.0000 mg | CHEWABLE_TABLET | Freq: Once | ORAL | Status: DC

## 2017-01-13 NOTE — Progress Notes (Signed)
ANTICOAGULATION CONSULT NOTE - Follow Up Consult  Pharmacy Consult for Heparin Indication: chest pain/ACS  Allergies  Allergen Reactions  . Lipitor [Atorvastatin] Swelling and Other (See Comments)    Other reaction(s): Other (See Comments) Urination problems, myalgias also Urination problems, myalgias also  . Other Other (See Comments)    Possible resistance to all narcotic pain meds-dilaudid might work  . Propofol Other (See Comments)    "violent"  . Benadryl [Diphenhydramine Hcl] Swelling    Hyperactivity, very   . Ondansetron Other (See Comments)    Headaches  . Promethazine Hcl Nausea And Vomiting  . Morphine And Related     Really bad headaches and his bp go up really high     Patient Measurements: Height: 6\' 2"  (188 cm) Weight: 205 lb 14.4 oz (93.4 kg) IBW/kg (Calculated) : 82.2 Heparin Dosing Weight: 93.4 kg  Vital Signs: Temp: 97.5 F (36.4 C) (11/04 0840) Temp Source: Oral (11/04 0840) BP: 113/64 (11/04 0840) Pulse Rate: 81 (11/04 0840)  Labs: Recent Labs    01/10/17 1637 01/11/17 0200 01/11/17 0640 01/12/17 0425 01/12/17 1504 01/13/17 0124 01/13/17 0732  HGB 16.9  --   --  15.6  --  15.1  --   HCT 47.3  --   --  45.6  --  43.5  --   PLT 162  --   --  141*  --  142*  --   APTT 33  --   --  145* 87* 43* 94*  LABPROT 13.3  --   --   --   --   --   --   INR 1.02  --   --   --   --   --   --   HEPARINUNFRC  --   --   --  1.06*  --  0.28* 0.60  CREATININE 1.00  --   --   --   --   --   --   TROPONINI  --  <0.03 <0.03  --   --   --   --     Estimated Creatinine Clearance: 98.2 mL/min (by C-G formula based on SCr of 1 mg/dL).   Assessment:  Anticoag: Eliquis for h/o afib and TIA 2016 says PTA but INR 1.02 (should be high if compliant?) . CBC WNL. Eliquis last dose 11/2 0830. Eliquis now on hold>>IV heparin for cath Monday. CHADSVASC of at least 4 (HTN, vasc dz, TIA   - 11/4 AM: aPTT 43, HL 0.28 lost IV access, hep off estimated 82min. Heparin resumed  with new IV access after estimated 1-2 hrs.. Repeat levels HL 0.6, aPTT 94. CBC remains WNL  Goal of Therapy:  Heparin level 0.3-0.7 units/ml  APTT 66-102 Monitor platelets by anticoagulation protocol: Yes   Plan:  Hold Eliquis. Needs cath Monday. IV heparin 1200 units/hr Daily HL, and CBC  Laray Corbit S. Alford Highland, PharmD, BCPS Clinical Staff Pharmacist Pager [phone]  [place], [place] 01/13/2017,10:21 AM

## 2017-01-13 NOTE — Progress Notes (Signed)
ANTICOAGULATION CONSULT NOTE - Follow Up Consult  Pharmacy Consult for Heparin Indication: chest pain/ACS  Allergies  Allergen Reactions  . Lipitor [Atorvastatin] Swelling and Other (See Comments)    Other reaction(s): Other (See Comments) Urination problems, myalgias also Urination problems, myalgias also  . Other Other (See Comments)    Possible resistance to all narcotic pain meds-dilaudid might work  . Propofol Other (See Comments)    "violent"  . Benadryl [Diphenhydramine Hcl] Swelling    Hyperactivity, very   . Ondansetron Other (See Comments)    Headaches  . Promethazine Hcl Nausea And Vomiting  . Morphine And Related     Really bad headaches and his bp go up really high     Patient Measurements: Height: 6\' 2"  (188 cm) Weight: 205 lb 14.4 oz (93.4 kg) IBW/kg (Calculated) : 82.2 Heparin Dosing Weight:  93.4 kg  Vital Signs: Temp: 97.9 F (36.6 C) (11/03 2100) Temp Source: Oral (11/03 2100) BP: 103/72 (11/03 2100) Pulse Rate: 77 (11/03 2100)  Labs: Recent Labs    01/10/17 1637 01/11/17 0200 01/11/17 0640 01/12/17 0425 01/12/17 1504 01/13/17 0124  HGB 16.9  --   --  15.6  --  15.1  HCT 47.3  --   --  45.6  --  43.5  PLT 162  --   --  141*  --  142*  APTT 33  --   --  145* 87* 43*  LABPROT 13.3  --   --   --   --   --   INR 1.02  --   --   --   --   --   HEPARINUNFRC  --   --   --  1.06*  --  0.28*  CREATININE 1.00  --   --   --   --   --   TROPONINI  --  <0.03 <0.03  --   --   --     Estimated Creatinine Clearance: 98.2 mL/min (by C-G formula based on SCr of 1 mg/dL).  Assessment: 56 yo male with chest pain. On Eliquis PTA for h/o afib and TIA 2016. Eliquis last dose 11/2 0830. Eliquis now on hold and for IV heparin per pharmacy. For cath Monday per cardiology notes.   Heparin level and aPTT subtherapeutic, however, RN reports patient was found to have lost IV access at the time labs were drawn. RN estimates heparin gtt likely off ~ 30 minutes before  labs drawn. IV re-established and heparin gtt resumed at previous rate of 1200 units/hr after being off for approximately 2 hours.   CBC stable and no s/s bleeding documented.   Goal of Therapy:  aPTT 66-102 seconds Monitor platelets by anticoagulation protocol: Yes   Plan:  Continue heparin gtt at 1200 units/hr Re-check heparin level and aPTT 6 hrs after time gtt resumed Daily heparin level and CBC Monitor for s/s bleeding Follow cardiology plan   Lavonda Jumbo, PharmD Clinical Pharmacist 01/13/17 3:40 AM

## 2017-01-13 NOTE — Progress Notes (Signed)
Progress Note  Patient Name: Bobby Floyd Date of Encounter: 01/13/2017  Primary Cardiologist: Dr. Marlou Porch  Subjective   Had a mild episode of CP yesterday  Inpatient Medications    Scheduled Meds: . ALPRAZolam  1 mg Oral q morning - 10a  . ALPRAZolam  2 mg Oral QHS  . carvedilol  6.25 mg Oral BID  . citalopram  30 mg Oral Q breakfast  . levothyroxine  100 mcg Oral QAC breakfast  . pantoprazole  40 mg Oral BID  . ramipril  10 mg Oral QHS  . sodium chloride flush  3 mL Intravenous Q12H  . sodium chloride flush  3 mL Intravenous Q12H  . traZODone  200 mg Oral QHS   Continuous Infusions: . sodium chloride    . sodium chloride    . sodium chloride Stopped (01/13/17 0834)  . heparin 1,200 Units/hr (01/12/17 1600)   PRN Meds: sodium chloride, sodium chloride, acetaminophen **OR** acetaminophen, fentaNYL (SUBLIMAZE) injection, HYDROcodone-acetaminophen, nitroGLYCERIN, sodium chloride flush, sodium chloride flush   Vital Signs    Vitals:   01/12/17 2100 01/13/17 0000 01/13/17 0405 01/13/17 0840  BP: 103/72 98/61 (!) 96/58 113/64  Pulse: 77  (!) 59 81  Resp: (!) 22  16 14   Temp: 97.9 F (36.6 C) (!) 97.3 F (36.3 C) (!) 97.3 F (36.3 C) (!) 97.5 F (36.4 C)  TempSrc: Oral Oral Oral Oral  SpO2: 96%  95% 95%  Weight:      Height:        Intake/Output Summary (Last 24 hours) at 01/13/2017 1207 Last data filed at 01/13/2017 0800 Gross per 24 hour  Intake 1296 ml  Output -  Net 1296 ml   Filed Weights   01/10/17 1600 01/10/17 2225  Weight: 212 lb (96.2 kg) 205 lb 14.4 oz (93.4 kg)    Telemetry    Normal sinus rhythm- Personally Reviewed  ECG    No new EKG to review- Personally Reviewed  Physical Exam   GEN: No acute distress.   Neck: No JVD Cardiac: RRR, no murmurs, rubs, or gallops.  Respiratory: Clear to auscultation bilaterally. GI: Soft, nontender, non-distended  MS: No edema; No deformity. Neuro:  Nonfocal  Psych: Normal affect   Labs    Chemistry Recent Labs  Lab 01/10/17 1637  NA 138  K 4.0  CL 107  CO2 23  GLUCOSE 95  BUN 10  CREATININE 1.00  CALCIUM 9.3  GFRNONAA >60  GFRAA >60  ANIONGAP 8     Hematology Recent Labs  Lab 01/10/17 1637 01/12/17 0425 01/13/17 0124  WBC 6.8 5.3 5.4  RBC 5.09 4.83 4.59  HGB 16.9 15.6 15.1  HCT 47.3 45.6 43.5  MCV 92.9 94.4 94.8  MCH 33.2 32.3 32.9  MCHC 35.7 34.2 34.7  RDW 12.7 12.5 12.6  PLT 162 141* 142*    Cardiac Enzymes Recent Labs  Lab 01/11/17 0200 01/11/17 0640  TROPONINI <0.03 <0.03    Recent Labs  Lab 01/10/17 1705 01/10/17 1904  TROPIPOC 0.01 0.01     BNPNo results for input(s): BNP, PROBNP in the last 168 hours.   DDimer No results for input(s): DDIMER in the last 168 hours.   Radiology    No results found.  Cardiac Studies   None  Patient Profile     56 y.o. male  with a hx of CAD S/P MI with multiple stents (2004, 2007, 2013), PAF on Eliquis,HLD, HTN, GERD, tonsillar cancer in remission, depression/anxiety and TIAwho presented  with complaints of chest pain.   Assessment & Plan    USAP:Pt has a long history of CAD with multiple stents, beginning at age 32. Last intervention was in 11/2013 with DES x 2 to the LCx and DES to LAD. Admitted with 2 week h/o CP and shortness of breath with pain radiating to left arm, profuse diaphoresis and fatigue. - Trop negative x 4 - No cath Friday due to Eliquis -awaiting Eliquis washout - CP c/w prior angina - Plan LHC tomorrow - continue IV heparin drip, aspirin, beta-blocker - check BMET  YEM:[id number] sinus rhythm on BB.  - CHADSVASC of at least 4 (HTN, vasc dz, TIA). -Eliquis has been stopped and patient now on IV heparin awaiting cath  Hypertension: BP well controlled at 113/59mmHg today with some soft BPs overnight -Continue carvedilol 6.25 mg BID and Altace 10 mg daily  Hyperlipidemia: Statin intolerant  - Last lipid panel in 02/2016 showed LDL 122.  - Repeat LDL for now  49 on Repatha      For questions or updates, please contact [place] Please consult [email or web address] for contact info under Cardiology/STEMI.      Signed, Fransico Him, MD  01/13/2017, 12:07 PM

## 2017-01-13 NOTE — Progress Notes (Signed)
PROGRESS NOTE   ELL TISO  URK:[id number]    DOB: Dec 10, 1960    DOA: 01/10/2017  PCP: Melony Overly, MD   I have briefly reviewed patients previous medical records in [facility].  Brief Narrative:  56 year old male with PMH of tonsillar cancer, treated and in remission, CAD status post multiple stents, depression, anxiety, TIA who presented to ED with 3 weeks history of progressive dyspnea on exertion and 3-5 days history of exertional chest pain. The symptoms are similar to his previous episodes of angina and he has strong family history of CAD and personal history of premature CAD. Cardiology consulted and since he received Eliquis up to this morning, plan for cardiac cath and possible PCI on 11/5 unless he becomes unstable over the weekend when May have to do it emergently.   Assessment & Plan:   Principal Problem:   Chest pain Active Problems:   History of Tonsillar cancer, stage IV squamous cell left tonsil   Hypercholesterolemia   Tobacco use   Coronary Artery Disease   S/P coronary artery stent placement - DES x 2 to dominant CFX and DES to LAD 11/10/2013   Paroxysmal atrial fibrillation (HCC)   Essential hypertension   Unstable angina - Several weeks history of ongoing dyspnea on exertion and exertional chest pain similar to prior episodes of angina. Strong family and personal history of CAD. - EKG without acute changes and troponin 2 negative. - Cardiology consultation appreciated. Since he received Eliquis up to this morning, have to postpone cardiac catheter to 11/5 Eliquis stopped. IV Heparin infusion started. -Did not report more chest pain since yesterday.  Has not been out of bed much.  Left heart cath tomorrow.  PAF - Currently in sinus rhythm. - CHADSVASC of at least 4 (HTN, vasc dz, TIA).  - Eliquis held for cath and IV Heparin started. Continue beta blockers as BP tolerates.  Periodic low blood pressures.  Essential hypertension -Periodic  low blood pressures but otherwise controlled.  Hyperlipidemia Intolerant to statins. Continue Repatha  Anxiety/depression Stable. Continue home medications.  History of TIA 2016. Anticoagulation discussion as above.  History of tonsillar carcinoma and melanoma In remission.  Mild thrombocytopenia -On review of prior labs, has had intermittent mild thrombocytopenia in the past.  Currently on IV heparin.  Monitor CBC closely.  No bleeding reported.  Stable.   DVT prophylaxis: IV heparin infusion Code Status: Full Family Communication: None at bedside Disposition: DC home when medically improved.   Consultants:  Cardiology   Procedures:  None  Antimicrobials:  None    Subjective: Not much chest pain since yesterday but has mostly been in bed.  Denies any other complaints.  ROS: No palpitations, dizziness or lightheadedness.  Objective:  Vitals:   01/13/17 0000 01/13/17 0405 01/13/17 0840 01/13/17 1318  BP: 98/61 (!) 96/58 113/64 122/82  Pulse:  (!) 59 81 74  Resp:  16 14 17   Temp: (!) 97.3 F (36.3 C) (!) 97.3 F (36.3 C) (!) 97.5 F (36.4 C) 97.8 F (36.6 C)  TempSrc: Oral Oral Oral Oral  SpO2:  95% 95% 94%  Weight:      Height:        Examination:  General exam: Middle-aged male, moderately built and nourished, lying comfortably supine in bed.  Does not appear in any distress. Respiratory system: Clear to auscultation. Respiratory effort normal.  Stable. Cardiovascular system: S1 & S2 heard, RRR. No JVD, murmurs, rubs, gallops or clicks. No pedal edema.  No change/stable.  Telemetry personally reviewed: Sinus rhythm. Gastrointestinal system: Abdomen is nondistended, soft and nontender. No organomegaly or masses felt. Normal bowel sounds heard. Central nervous system: Alert and oriented. No focal neurological deficits. Extremities: Symmetric 5 x 5 power. Skin: No rashes, lesions or ulcers Psychiatry: Judgement and insight appear normal. Mood & affect  appropriate.     Data Reviewed: I have personally reviewed following labs and imaging studies  CBC: Recent Labs  Lab 01/10/17 1637 01/12/17 0425 01/13/17 0124  WBC 6.8 5.3 5.4  HGB 16.9 15.6 15.1  HCT 47.3 45.6 43.5  MCV 92.9 94.4 94.8  PLT 162 141* 563*   Basic Metabolic Panel: Recent Labs  Lab 01/10/17 1637 01/13/17 1215  NA 138 137  K 4.0 4.1  CL 107 108  CO2 23 25  GLUCOSE 95 99  BUN 10 10  CREATININE 1.00 1.00  CALCIUM 9.3 9.1   Liver Function Tests: No results for input(s): AST, ALT, ALKPHOS, BILITOT, PROT, ALBUMIN in the last 168 hours. Coagulation Profile: Recent Labs  Lab 01/10/17 1637  INR 1.02   Cardiac Enzymes: Recent Labs  Lab 01/11/17 0200 01/11/17 0640  TROPONINI <0.03 <0.03   HbA1C: No results for input(s): HGBA1C in the last 72 hours. CBG: Recent Labs  Lab 01/10/17 1722  GLUCAP 103*    No results found for this or any previous visit (from the past 240 hour(s)).       Radiology Studies: No results found.      Scheduled Meds: . ALPRAZolam  1 mg Oral q morning - 10a  . ALPRAZolam  2 mg Oral QHS  . aspirin  81 mg Oral Daily  . carvedilol  6.25 mg Oral BID  . citalopram  30 mg Oral Q breakfast  . levothyroxine  100 mcg Oral QAC breakfast  . pantoprazole  40 mg Oral BID  . ramipril  10 mg Oral QHS  . sodium chloride flush  3 mL Intravenous Q12H  . sodium chloride flush  3 mL Intravenous Q12H  . traZODone  200 mg Oral QHS   Continuous Infusions: . sodium chloride    . sodium chloride    . sodium chloride Stopped (01/13/17 0834)  . heparin 1,200 Units/hr (01/12/17 1600)     LOS: 2 days     Dierra Riesgo, MD, FACP, FHM. [facility] Pager [phone]  If 7PM-7AM, please contact night-coverage [email or web address] Password [facility] 01/13/2017, 2:34 PM

## 2017-01-13 NOTE — H&P (View-Only) (Signed)
Progress Note  Patient Name: Bobby Floyd Date of Encounter: 01/13/2017  Primary Cardiologist: Dr. Marlou Porch  Subjective   Had a mild episode of CP yesterday  Inpatient Medications    Scheduled Meds: . ALPRAZolam  1 mg Oral q morning - 10a  . ALPRAZolam  2 mg Oral QHS  . carvedilol  6.25 mg Oral BID  . citalopram  30 mg Oral Q breakfast  . levothyroxine  100 mcg Oral QAC breakfast  . pantoprazole  40 mg Oral BID  . ramipril  10 mg Oral QHS  . sodium chloride flush  3 mL Intravenous Q12H  . sodium chloride flush  3 mL Intravenous Q12H  . traZODone  200 mg Oral QHS   Continuous Infusions: . sodium chloride    . sodium chloride    . sodium chloride Stopped (01/13/17 0834)  . heparin 1,200 Units/hr (01/12/17 1600)   PRN Meds: sodium chloride, sodium chloride, acetaminophen **OR** acetaminophen, fentaNYL (SUBLIMAZE) injection, HYDROcodone-acetaminophen, nitroGLYCERIN, sodium chloride flush, sodium chloride flush   Vital Signs    Vitals:   01/12/17 2100 01/13/17 0000 01/13/17 0405 01/13/17 0840  BP: 103/72 98/61 (!) 96/58 113/64  Pulse: 77  (!) 59 81  Resp: (!) 22  16 14   Temp: 97.9 F (36.6 C) (!) 97.3 F (36.3 C) (!) 97.3 F (36.3 C) (!) 97.5 F (36.4 C)  TempSrc: Oral Oral Oral Oral  SpO2: 96%  95% 95%  Weight:      Height:        Intake/Output Summary (Last 24 hours) at 01/13/2017 1207 Last data filed at 01/13/2017 0800 Gross per 24 hour  Intake 1296 ml  Output -  Net 1296 ml   Filed Weights   01/10/17 1600 01/10/17 2225  Weight: 212 lb (96.2 kg) 205 lb 14.4 oz (93.4 kg)    Telemetry    Normal sinus rhythm- Personally Reviewed  ECG    No new EKG to review- Personally Reviewed  Physical Exam   GEN: No acute distress.   Neck: No JVD Cardiac: RRR, no murmurs, rubs, or gallops.  Respiratory: Clear to auscultation bilaterally. GI: Soft, nontender, non-distended  MS: No edema; No deformity. Neuro:  Nonfocal  Psych: Normal affect   Labs    Chemistry Recent Labs  Lab 01/10/17 1637  NA 138  K 4.0  CL 107  CO2 23  GLUCOSE 95  BUN 10  CREATININE 1.00  CALCIUM 9.3  GFRNONAA >60  GFRAA >60  ANIONGAP 8     Hematology Recent Labs  Lab 01/10/17 1637 01/12/17 0425 01/13/17 0124  WBC 6.8 5.3 5.4  RBC 5.09 4.83 4.59  HGB 16.9 15.6 15.1  HCT 47.3 45.6 43.5  MCV 92.9 94.4 94.8  MCH 33.2 32.3 32.9  MCHC 35.7 34.2 34.7  RDW 12.7 12.5 12.6  PLT 162 141* 142*    Cardiac Enzymes Recent Labs  Lab 01/11/17 0200 01/11/17 0640  TROPONINI <0.03 <0.03    Recent Labs  Lab 01/10/17 1705 01/10/17 1904  TROPIPOC 0.01 0.01     BNPNo results for input(s): BNP, PROBNP in the last 168 hours.   DDimer No results for input(s): DDIMER in the last 168 hours.   Radiology    No results found.  Cardiac Studies   None  Patient Profile     56 y.o. male  with a hx of CAD S/P MI with multiple stents (2004, 2007, 2013), PAF on Eliquis,HLD, HTN, GERD, tonsillar cancer in remission, depression/anxiety and TIAwho presented  with complaints of chest pain.   Assessment & Plan    USAP:Pt has a long history of CAD with multiple stents, beginning at age 50. Last intervention was in 11/2013 with DES x 2 to the LCx and DES to LAD. Admitted with 2 week h/o CP and shortness of breath with pain radiating to left arm, profuse diaphoresis and fatigue. - Trop negative x 4 - No cath Friday due to Eliquis -awaiting Eliquis washout - CP c/w prior angina - Plan LHC tomorrow - continue IV heparin drip, aspirin, beta-blocker - check BMET  STM:[id number] sinus rhythm on BB.  - CHADSVASC of at least 4 (HTN, vasc dz, TIA). -Eliquis has been stopped and patient now on IV heparin awaiting cath  Hypertension: BP well controlled at 113/44mmHg today with some soft BPs overnight -Continue carvedilol 6.25 mg BID and Altace 10 mg daily  Hyperlipidemia: Statin intolerant  - Last lipid panel in 02/2016 showed LDL 122.  - Repeat LDL for now  49 on Repatha      For questions or updates, please contact [facility] Please consult [email or web address] for contact info under Cardiology/STEMI.      Signed, Fransico Him, MD  01/13/2017, 12:07 PM

## 2017-01-13 NOTE — Progress Notes (Addendum)
I made Katie, the pharmacist, aware that I had stopped pt's heparin drip from 0100 to 0205 this morning because his IV accidentally came out. Pt's IV was in at 0100, when phlebotomist came at [facility] IV was out. Small amount of blood on sheet.

## 2017-01-13 NOTE — Plan of Care (Signed)
  Pain Managment: General experience of comfort will improve 01/13/2017 0549 - Progressing by Irish Lack, RN

## 2017-01-14 ENCOUNTER — Encounter (HOSPITAL_COMMUNITY): Payer: Self-pay | Admitting: Cardiology

## 2017-01-14 ENCOUNTER — Encounter (HOSPITAL_COMMUNITY)
Admission: EM | Disposition: A | Discharge status: Home / Self Care | Payer: Self-pay | Source: Home / Self Care | Attending: Internal Medicine

## 2017-01-14 DIAGNOSIS — Z955 Presence of coronary angioplasty implant and graft: Secondary | ICD-10-CM

## 2017-01-14 HISTORY — PX: LEFT HEART CATH AND CORONARY ANGIOGRAPHY: CATH118249

## 2017-01-14 HISTORY — PX: INTRAVASCULAR PRESSURE WIRE/FFR STUDY: CATH118243

## 2017-01-14 LAB — CBC
HEMATOCRIT: 43.8 % (ref 39.0–52.0)
HEMOGLOBIN: 15.1 g/dL (ref 13.0–17.0)
MCH: 32.7 pg (ref 26.0–34.0)
MCHC: 34.5 g/dL (ref 30.0–36.0)
MCV: 94.8 fL (ref 78.0–100.0)
Platelets: 136 10*3/uL — ABNORMAL LOW (ref 150–400)
RBC: 4.62 MIL/uL (ref 4.22–5.81)
RDW: 12.4 % (ref 11.5–15.5)
WBC: 5.3 10*3/uL (ref 4.0–10.5)

## 2017-01-14 LAB — HEPARIN LEVEL (UNFRACTIONATED): Heparin Unfractionated: 0.74 IU/mL — ABNORMAL HIGH (ref 0.30–0.70)

## 2017-01-14 LAB — POCT ACTIVATED CLOTTING TIME: ACTIVATED CLOTTING TIME: 274 s

## 2017-01-14 LAB — APTT: APTT: 120 s — AB (ref 24–36)

## 2017-01-14 SURGERY — Surgical Case
Anesthesia: *Unknown

## 2017-01-14 SURGERY — LEFT HEART CATH AND CORONARY ANGIOGRAPHY
Anesthesia: LOCAL

## 2017-01-14 MED ORDER — LIDOCAINE HCL (PF) 1 % IJ SOLN
INTRAMUSCULAR | Status: DC | PRN
  Administered 2017-01-14: 2 mL

## 2017-01-14 MED ORDER — MIDAZOLAM HCL 2 MG/2ML IJ SOLN
INTRAMUSCULAR | Status: AC
  Filled 2017-01-14: qty 2

## 2017-01-14 MED ORDER — SODIUM CHLORIDE 0.9% FLUSH
3.0000 mL | Freq: Two times a day (BID) | INTRAVENOUS | Status: DC
  Administered 2017-01-14: 3 mL via INTRAVENOUS

## 2017-01-14 MED ORDER — SODIUM CHLORIDE 0.9% FLUSH
3.0000 mL | INTRAVENOUS | Status: DC | PRN
Start: 2017-01-14 — End: 2017-01-14

## 2017-01-14 MED ORDER — ADENOSINE (DIAGNOSTIC) 140MCG/KG/MIN
INTRAVENOUS | Status: DC | PRN
  Administered 2017-01-14: 140 ug/kg/min via INTRAVENOUS

## 2017-01-14 MED ORDER — FENTANYL CITRATE (PF) 100 MCG/2ML IJ SOLN
INTRAMUSCULAR | Status: AC
  Filled 2017-01-14: qty 2

## 2017-01-14 MED ORDER — FENTANYL CITRATE (PF) 100 MCG/2ML IJ SOLN
INTRAMUSCULAR | Status: DC | PRN
Start: 2017-01-14 — End: 2017-01-14
  Administered 2017-01-14: 25 ug via INTRAVENOUS

## 2017-01-14 MED ORDER — IOPAMIDOL (ISOVUE-370) INJECTION 76%
INTRAVENOUS | Status: DC | PRN
  Administered 2017-01-14: 80 mL via INTRAVENOUS

## 2017-01-14 MED ORDER — ASPIRIN 81 MG PO CHEW
CHEWABLE_TABLET | ORAL | Status: AC
  Administered 2017-01-14: 81 mg via ORAL
  Filled 2017-01-14: qty 1

## 2017-01-14 MED ORDER — MIDAZOLAM HCL 2 MG/2ML IJ SOLN
INTRAMUSCULAR | Status: DC | PRN
  Administered 2017-01-14: 1 mg via INTRAVENOUS

## 2017-01-14 MED ORDER — HEPARIN (PORCINE) IN NACL 2-0.9 UNIT/ML-% IJ SOLN
INTRAMUSCULAR | Status: AC
  Filled 2017-01-14: qty 1500

## 2017-01-14 MED ORDER — HYDRALAZINE HCL 20 MG/ML IJ SOLN: 5.0000 mg | INTRAMUSCULAR | Status: AC | PRN

## 2017-01-14 MED ORDER — HEPARIN SODIUM (PORCINE) 1000 UNIT/ML IJ SOLN
INTRAMUSCULAR | Status: AC
  Filled 2017-01-14: qty 1

## 2017-01-14 MED ORDER — LIDOCAINE HCL (PF) 1 % IJ SOLN
INTRAMUSCULAR | Status: AC
  Filled 2017-01-14: qty 30

## 2017-01-14 MED ORDER — VERAPAMIL HCL 2.5 MG/ML IV SOLN
INTRAVENOUS | Status: AC
  Filled 2017-01-14: qty 2

## 2017-01-14 MED ORDER — SODIUM CHLORIDE 0.9 % IV SOLN: 250.0000 mL | INTRAVENOUS | Status: DC | PRN

## 2017-01-14 MED ORDER — HEPARIN (PORCINE) IN NACL 2-0.9 UNIT/ML-% IJ SOLN
INTRAMUSCULAR | Status: AC | PRN
  Administered 2017-01-14: 1500 mL

## 2017-01-14 MED ORDER — RAMIPRIL 5 MG PO CAPS: 10.0000 mg | ORAL_CAPSULE | Freq: Every day | ORAL | Status: DC

## 2017-01-14 MED ORDER — ADENOSINE 12 MG/4ML IV SOLN
INTRAVENOUS | Status: AC
  Filled 2017-01-14: qty 16

## 2017-01-14 MED ORDER — SODIUM CHLORIDE 0.9 % IV SOLN
INTRAVENOUS | Status: AC
  Administered 2017-01-14: 09:00:00 via INTRAVENOUS

## 2017-01-14 MED ORDER — LABETALOL HCL 5 MG/ML IV SOLN: 10.0000 mg | INTRAVENOUS | Status: AC | PRN

## 2017-01-14 MED ORDER — HEPARIN SODIUM (PORCINE) 1000 UNIT/ML IJ SOLN
INTRAMUSCULAR | Status: DC | PRN
  Administered 2017-01-14 (×2): 5000 [IU] via INTRAVENOUS

## 2017-01-14 SURGICAL SUPPLY — 15 items
CATH LAUNCHER 6FR EBU3.5 (CATHETERS) ×1 IMPLANT
CATH LAUNCHER 6FR RBU 3.5 SH (CATHETERS) ×1 IMPLANT
CATH MICROCATH NAVVUS (MICROCATHETER) IMPLANT
CATH OPTITORQUE TIG 4.0 5F (CATHETERS) ×1 IMPLANT
DEVICE RAD COMP TR BAND LRG (VASCULAR PRODUCTS) ×1 IMPLANT
GLIDESHEATH SLEND A-KIT 6F 22G (SHEATH) ×1 IMPLANT
GUIDEWIRE INQWIRE 1.5J.035X260 (WIRE) IMPLANT
INQWIRE 1.5J .035X260CM (WIRE) ×2
KIT ESSENTIALS PG (KITS) ×1 IMPLANT
KIT HEART LEFT (KITS) ×1 IMPLANT
MICROCATHETER NAVVUS (MICROCATHETER) ×2
PACK CARDIAC CATHETERIZATION (CUSTOM PROCEDURE TRAY) ×2 IMPLANT
TRANSDUCER W/STOPCOCK (MISCELLANEOUS) ×2 IMPLANT
TUBING CIL FLEX 10 FLL-RA (TUBING) ×2 IMPLANT
WIRE HI TORQ BMW 190CM (WIRE) ×1 IMPLANT

## 2017-01-14 NOTE — Progress Notes (Signed)
Pt discharged home per order. IV and telemetry box removed. Pt received discharge instructions and all questions were answered. Pt discharged with all of his belongings. Pt discharged via wheelchair and was accompanied by pt's nurse tech.   Grant Fontana BSN, RN

## 2017-01-14 NOTE — Discharge Summary (Addendum)
Physician Discharge Summary  Bobby Floyd CBJ:[id number] DOB: 16-Oct-1960  PCP: Melony Overly, MD  Admit date: 01/10/2017 Discharge date: 01/14/2017  Recommendations for Outpatient Follow-up:  1. Dr. Carlos American, PCP in 4 days with repeat labs (CBC & BMP). 2. Dr. Candee Furbish, Cardiology: Office will arrange follow-up.  Home Health: None Equipment/Devices: None    Discharge Condition: Improved and stable  CODE STATUS: Full  Diet recommendation: Heart healthy diet.  Discharge Diagnoses:  Principal Problem:   Chest pain Active Problems:   History of Tonsillar cancer, stage IV squamous cell left tonsil   Hypercholesterolemia   Tobacco use   Coronary Artery Disease   S/P coronary artery stent placement - DES x 2 to dominant CFX and DES to LAD 11/10/2013   Paroxysmal atrial fibrillation (HCC)   Essential hypertension   Brief Summary: 56 year old male with PMH of tonsillar cancer, treated and in remission, CAD status post multiple stents, depression, anxiety, TIA who presented to ED with 3 weeks history of progressive dyspnea on exertion and 3-5 days history of exertional chest pain. The symptoms are similar to his previous episodes of angina and he has strong family history of CAD and personal history of premature CAD. Cardiology consulted.  Assessment & Plan:   Unstable angina - Several weeks history of ongoing dyspnea on exertion and exertional chest pain similar to prior episodes of angina. Strong family and personal history of CAD. - EKG without acute changes and troponin 2 negative. - Cardiology was consulted. Eliquis was held and he was placed on IV heparin infusion. - He underwent left heart cath on 11/5 with results as indicated below. No intervention was required. Post cath, I discussed in detail with Dr. Marlou Porch, Cardiology who cleared him for discharge home and no additional medications recommended. His office will arrange outpatient follow-up. He recommended  resuming Eliquis from tomorrow and stopping aspirin (started in the hospital while on IV heparin infusion) at discharge. - Patient ambulated approximately 470 feet and felt some dyspnea but no chest pain. He was reassured and advised to gradually increase activity as tolerated. He refused NTG sublingual prescription.  PAF - Currently in sinus rhythm. - CHADSVASC of at least 4 (HTN, vasc dz, TIA).  - Eliquis held for cath and IV Heparin started.  - As discussed above, resume Eliquis from tomorrow. This was discussed with patient who verbalized understanding.  Essential hypertension -Periodic low blood pressures but otherwise controlled.  Hyperlipidemia Intolerant to statins. Continue Repatha  Anxiety/depression Stable. Continue home medications.  History of TIA 2016. Anticoagulation discussion as above.  History of tonsillar carcinoma and melanoma In remission.  Mild thrombocytopenia -On review of prior labs, has had intermittent mild thrombocytopenia in the past.  Was on IV heparin.  No bleeding reported. Follow CBCs in a few days as outpatient.    Consultants:  Cardiology   Procedures:    INTRAVASCULAR PRESSURE WIRE/FFR STUDY  LEFT HEART CATH AND CORONARY ANGIOGRAPHY  Conclusion     Ost LAD lesion is 50% stenosed. FFR negative at 0.98  Mid RCA lesion is 80% stenosed. -This is in a small nondominant vessel and not much change from prior cath  Previously placed Prox LAD stent (Promus Premier DES 3.5 mm x 14 mm) is widely patent.  Previously placed Prox Cx to Mid Cx stent (Promus Premier DES -3.0 mm 18 mm) is widely patent.  Previously placed Mid Cx stent (Promus Premier DES 3.0 mm x 12 mm) is widely patent.  The left ventricular ejection  fraction is 50-55% by visual estimate.  LV end diastolic pressure is mildly elevated.   Angiographically the only potential lesion noted was the ostial LAD lesion that was not physiologically significant by  FFR. Consider potentially non-anginal cause versus microvascular disease.  The ostial LAD lesion does warrant close monitoring for symptoms and potentially reevaluation with stress test.  Would consider continuing lifelong Plavix  Plan: Return to nursing unit for TR BAND removal. Discharge planning per primary team         Discharge Instructions  Discharge Instructions    Call MD for:  difficulty breathing, headache or visual disturbances   Complete by:  As directed    Call MD for:  extreme fatigue   Complete by:  As directed    Call MD for:  persistant dizziness or light-headedness   Complete by:  As directed    Call MD for:  severe uncontrolled pain   Complete by:  As directed    Diet - low sodium heart healthy   Complete by:  As directed    Discharge instructions   Complete by:  As directed    As discussed with you, do not take Eliquis today 01/14/2017. Please resume Eliquis at prior dose from tomorrow 01/15/2017.   Increase activity slowly   Complete by:  As directed        Medication List    STOP taking these medications   sildenafil 20 MG tablet Commonly known as:  REVATIO     TAKE these medications   ALPRAZolam 1 MG tablet Commonly known as:  XANAX Take 1-2 mg by mouth See admin instructions. Take 1 mg in the morning and 2 mg in the evening   apixaban 5 MG Tabs tablet Commonly known as:  ELIQUIS Take 1 tablet (5 mg total) by mouth 2 (two) times daily.   carvedilol 6.25 MG tablet Commonly known as:  COREG Take 1 tablet (6.25 mg total) by mouth 2 (two) times daily.   citalopram 20 MG tablet Commonly known as:  CELEXA Take 30 mg by mouth daily.   HYDROcodone-acetaminophen 10-325 MG tablet Commonly known as:  NORCO Take 1 tablet by mouth every 6 (six) hours as needed for moderate pain.   levothyroxine 100 MCG tablet Commonly known as:  SYNTHROID, LEVOTHROID Take 100 mcg by mouth daily before breakfast.   pantoprazole 40 MG tablet Commonly  known as:  PROTONIX Take 40 mg by mouth 2 (two) times daily.   ramipril 5 MG capsule Commonly known as:  ALTACE Take 2 capsules (10 mg total) at bedtime by mouth. What changed:    when to take this  additional instructions   REPATHA SURECLICK 357 MG/ML Soaj Generic drug:  Evolocumab Inject 1 pen into the skin every 14 (fourteen) days.   traZODone 100 MG tablet Commonly known as:  DESYREL Take 200 mg by mouth at bedtime.      Follow-up Information    Melony Overly, MD. Schedule an appointment as soon as possible for a visit in 4 day(s).   Specialty:  Family Medicine Why:  To be seen with repeat labs (CBC & BMP). Contact information: [place] [address] [phone]        Jerline Pain, MD. Schedule an appointment as soon as possible for a visit.   Specialty:  Cardiology Why:  Office will call with appointment. Contact information: [address] [place] [address] [phone]  Allergies  Allergen Reactions  . Lipitor [Atorvastatin] Swelling and Other (See Comments)    Other reaction(s): Other (See Comments) Urination problems, myalgias also Urination problems, myalgias also  . Other Other (See Comments)    Possible resistance to all narcotic pain meds-dilaudid might work  . Propofol Other (See Comments)    "violent"  . Benadryl [Diphenhydramine Hcl] Swelling    Hyperactivity, very   . Ondansetron Other (See Comments)    Headaches  . Promethazine Hcl Nausea And Vomiting  . Morphine And Related     Really bad headaches and his bp go up really high       Procedures/Studies: Dg Chest 2 View  Result Date: 01/10/2017 CLINICAL DATA:  56 y/o  M; chest pain and cough. EXAM: CHEST  2 VIEW COMPARISON:  08/10/2015 chest radiograph FINDINGS: Stable heart size and mediastinal contours are within normal limits given projection and technique. Both lungs are clear. No pleural effusion or pneumothorax. Partially  visualized cervical fusion hardware. No acute osseous abnormality is evident. IMPRESSION: No acute pulmonary process identified. Electronically Signed   By: Kristine Garbe M.D.   On: 01/10/2017 17:22      Subjective: Patient was seen this morning after he returned from cath. He denied chest pain or dyspnea. Subsequently ambulated and had mild dyspnea.  Discharge Exam:  Vitals:   01/14/17 1145 01/14/17 1200 01/14/17 1300 01/14/17 1315  BP:  96/62 (!) 140/107   Pulse: 61 61 75   Resp: 16 16 19 15   Temp:  (!) 97.4 F (36.3 C)    TempSrc:  Oral    SpO2: 98% 97% 100%   Weight:      Height:        General exam: Middle-aged male, moderately built and nourished, lying comfortably supine in bed.  Does not appear in any distress. Respiratory system: Clear to auscultation. Respiratory effort normal.   Cardiovascular system: S1 & S2 heard, RRR. No JVD, murmurs, rubs, gallops or clicks. No pedal edema. Telemetry: Sinus rhythm. Gastrointestinal system: Abdomen is nondistended, soft and nontender. No organomegaly or masses felt. Normal bowel sounds heard. Central nervous system: Alert and oriented. No focal neurological deficits. Extremities: Symmetric 5 x 5 power. Skin: No rashes, lesions or ulcers Psychiatry: Judgement and insight appear normal. Mood & affect appropriate.       The results of significant diagnostics from this hospitalization (including imaging, microbiology, ancillary and laboratory) are listed below for reference.     Labs: CBC: Recent Labs  Lab 01/10/17 1637 01/12/17 0425 01/13/17 0124 01/14/17 0227  WBC 6.8 5.3 5.4 5.3  HGB 16.9 15.6 15.1 15.1  HCT 47.3 45.6 43.5 43.8  MCV 92.9 94.4 94.8 94.8  PLT 162 141* 142* 481*   Basic Metabolic Panel: Recent Labs  Lab 01/10/17 1637 01/13/17 1215  NA 138 137  K 4.0 4.1  CL 107 108  CO2 23 25  GLUCOSE 95 99  BUN 10 10  CREATININE 1.00 1.00  CALCIUM 9.3 9.1   Cardiac Enzymes: Recent Labs  Lab  01/11/17 0200 01/11/17 0640  TROPONINI <0.03 <0.03   CBG: Recent Labs  Lab 01/10/17 1722  GLUCAP 103*   Lipid Profile Recent Labs    01/12/17 0425  CHOL 114  HDL 43  LDLCALC 49  TRIG 111  CHOLHDL 2.7     Time coordinating discharge: Less than 30 minutes  SIGNED:  Vernell Leep, MD, FACP, FHM. [facility] Pager [phone]  If 7PM-7AM, please contact night-coverage [email or web address] Password [email or web address]  01/14/2017, 3:32 PM

## 2017-01-14 NOTE — Discharge Instructions (Signed)

## 2017-01-14 NOTE — Progress Notes (Addendum)
[id number] Came to see pt to walk. He stated he just walked the hall which is 470 ft and that he felt SOB.  I have seen pt in the past so did brief review with ed. He is trying to follow heart healthy diet and has been going to the gym. Stated until recently he was walking 5 miles. Reviewed NTG use but pt stated will not use due to headache. Encouraged to call 911 if he has CP and he does not want to take NTG. Pt is down because he stated he cannot do much activity without getting SOB and tired. He has attended CRP 2 in the past. Encouraged pt to start his walking slowly and increase.  Has not smoked ina few years. Emotional support given. Graylon Good RN BSN 01/14/2017 2:42 PM

## 2017-01-14 NOTE — Progress Notes (Signed)
ANTICOAGULATION CONSULT NOTE - Follow Up Consult  Pharmacy Consult for heparin Indication: Afib and USAP   Labs: Recent Labs    01/11/17 0640  01/12/17 0425  01/13/17 0124 01/13/17 0732 01/13/17 1215 01/14/17 0227  HGB  --    < > 15.6  --  15.1  --   --  15.1  HCT  --   --  45.6  --  43.5  --   --  43.8  PLT  --   --  141*  --  142*  --   --  136*  APTT  --   --  145*   < > 43* 94*  --  120*  HEPARINUNFRC  --    < > 1.06*  --  0.28* 0.60  --  0.74*  CREATININE  --   --   --   --   --   --  1.00  --   TROPONINI <0.03  --   --   --   --   --   --   --    < > = values in this interval not displayed.    Assessment: 56yo male now above goal on heparin after one level at goal.  Goal of Therapy:  Heparin level 0.3-0.7 units/ml   Plan:  Will decrease heparin gtt by 1 unit/kg/hr to 1100 units/hr and check level in 6hr.  Wynona Neat, PharmD, BCPS  01/14/2017,4:34 AM

## 2017-01-14 NOTE — Progress Notes (Signed)
Right radial TR band removed, per order. No bleeding. Gauze and tegaderm dressing applied. Pt educated on restrictions. Pt verbalized understanding.   Grant Fontana BSN, RN

## 2017-01-14 NOTE — Interval H&P Note (Signed)
History and Physical Interval Note:  01/14/2017 7:24 AM  Bobby Floyd  has presented today for surgery, with the diagnosis of unstable angina, cad.  The various methods of treatment have been discussed with the patient and family. After consideration of risks, benefits and other options for treatment, the patient has consented to  Procedure(s): LEFT HEART CATH AND CORONARY ANGIOGRAPHY (N/A) with possible PERCUTANEOUS CORONARY INTERVENTION as a surgical intervention .  The patient's history has been reviewed, patient examined, no change in status, stable for surgery.  I have reviewed the patient's chart and labs.  Questions were answered to the patient's satisfaction.    Cath Lab Visit (complete for each Cath Lab visit)  Clinical Evaluation Leading to the Procedure:   ACS: Yes.    Non-ACS:    Anginal Classification: CCS III  Anti-ischemic medical therapy: Minimal Therapy (1 class of medications)  Non-Invasive Test Results: No non-invasive testing performed  Prior CABG: No previous CABG   Glenetta Hew

## 2017-01-15 MED FILL — Verapamil HCl IV Soln 2.5 MG/ML: INTRAVENOUS | Qty: 2 | Status: AC

## 2017-01-18 DIAGNOSIS — R079 Chest pain, unspecified: Secondary | ICD-10-CM | POA: Diagnosis not present

## 2017-01-18 DIAGNOSIS — R51 Headache: Secondary | ICD-10-CM | POA: Diagnosis not present

## 2017-01-18 DIAGNOSIS — R945 Abnormal results of liver function studies: Secondary | ICD-10-CM | POA: Diagnosis not present

## 2017-01-18 DIAGNOSIS — I10 Essential (primary) hypertension: Secondary | ICD-10-CM | POA: Diagnosis not present

## 2017-01-18 DIAGNOSIS — Z9189 Other specified personal risk factors, not elsewhere classified: Secondary | ICD-10-CM | POA: Diagnosis not present

## 2017-01-21 ENCOUNTER — Telehealth: Payer: Self-pay | Admitting: Pharmacist

## 2017-01-21 DIAGNOSIS — I119 Hypertensive heart disease without heart failure: Secondary | ICD-10-CM | POA: Diagnosis not present

## 2017-01-21 DIAGNOSIS — M25521 Pain in right elbow: Secondary | ICD-10-CM | POA: Diagnosis not present

## 2017-01-21 DIAGNOSIS — M7701 Medial epicondylitis, right elbow: Secondary | ICD-10-CM | POA: Diagnosis not present

## 2017-01-21 NOTE — Telephone Encounter (Signed)
Patient will like to talk to [facility] tomorrow. Call patient with best time to stop by for Repatha paperwork and to ask few questions.

## 2017-01-21 NOTE — Telephone Encounter (Signed)
Patient calling, states that Jinny Blossom usually helps him with his paperwork for Eliquis. Patient informed that Jinny Blossom is out of the office and would like to speak with pharmacist.

## 2017-01-22 NOTE — Telephone Encounter (Signed)
Spoke with pt - he will come to clinic tomorrow at [facility] to fill out [employer] paperwork for 2019.

## 2017-01-22 NOTE — Telephone Encounter (Signed)
LMOM for pt to return call. 

## 2017-01-23 ENCOUNTER — Telehealth: Payer: Self-pay | Admitting: Pharmacist

## 2017-01-23 NOTE — Telephone Encounter (Signed)
Pt presented today to fill out Repatha patient assistance paperwork. He will fax [place] form (25% assistance for 2019 year) tomorrow. Will need to submit new PA for Repatha prior to submitting [employer] Application.

## 2017-01-29 DIAGNOSIS — R945 Abnormal results of liver function studies: Secondary | ICD-10-CM | POA: Diagnosis not present

## 2017-01-29 DIAGNOSIS — R1084 Generalized abdominal pain: Secondary | ICD-10-CM | POA: Diagnosis not present

## 2017-01-29 DIAGNOSIS — K8020 Calculus of gallbladder without cholecystitis without obstruction: Secondary | ICD-10-CM | POA: Diagnosis not present

## 2017-01-29 DIAGNOSIS — K8080 Other cholelithiasis without obstruction: Secondary | ICD-10-CM | POA: Diagnosis not present

## 2017-01-30 DIAGNOSIS — I69998 Other sequelae following unspecified cerebrovascular disease: Secondary | ICD-10-CM | POA: Diagnosis not present

## 2017-01-30 DIAGNOSIS — R079 Chest pain, unspecified: Secondary | ICD-10-CM | POA: Diagnosis not present

## 2017-01-30 DIAGNOSIS — I119 Hypertensive heart disease without heart failure: Secondary | ICD-10-CM | POA: Diagnosis not present

## 2017-01-30 DIAGNOSIS — I252 Old myocardial infarction: Secondary | ICD-10-CM | POA: Diagnosis not present

## 2017-01-30 DIAGNOSIS — K819 Cholecystitis, unspecified: Secondary | ICD-10-CM | POA: Diagnosis not present

## 2017-01-30 DIAGNOSIS — Z888 Allergy status to other drugs, medicaments and biological substances status: Secondary | ICD-10-CM | POA: Diagnosis not present

## 2017-01-30 DIAGNOSIS — K8010 Calculus of gallbladder with chronic cholecystitis without obstruction: Secondary | ICD-10-CM | POA: Diagnosis not present

## 2017-01-30 DIAGNOSIS — I639 Cerebral infarction, unspecified: Secondary | ICD-10-CM | POA: Diagnosis not present

## 2017-01-30 DIAGNOSIS — F329 Major depressive disorder, single episode, unspecified: Secondary | ICD-10-CM | POA: Diagnosis not present

## 2017-01-30 DIAGNOSIS — K8000 Calculus of gallbladder with acute cholecystitis without obstruction: Secondary | ICD-10-CM | POA: Diagnosis not present

## 2017-01-30 DIAGNOSIS — K219 Gastro-esophageal reflux disease without esophagitis: Secondary | ICD-10-CM | POA: Diagnosis not present

## 2017-01-30 DIAGNOSIS — Z8589 Personal history of malignant neoplasm of other organs and systems: Secondary | ICD-10-CM | POA: Diagnosis not present

## 2017-01-30 DIAGNOSIS — K8012 Calculus of gallbladder with acute and chronic cholecystitis without obstruction: Secondary | ICD-10-CM | POA: Diagnosis not present

## 2017-01-30 DIAGNOSIS — Z885 Allergy status to narcotic agent status: Secondary | ICD-10-CM | POA: Diagnosis not present

## 2017-01-30 DIAGNOSIS — Z79899 Other long term (current) drug therapy: Secondary | ICD-10-CM | POA: Diagnosis not present

## 2017-01-30 DIAGNOSIS — D135 Benign neoplasm of extrahepatic bile ducts: Secondary | ICD-10-CM | POA: Diagnosis not present

## 2017-01-30 DIAGNOSIS — I10 Essential (primary) hypertension: Secondary | ICD-10-CM | POA: Diagnosis not present

## 2017-01-30 DIAGNOSIS — F419 Anxiety disorder, unspecified: Secondary | ICD-10-CM | POA: Diagnosis not present

## 2017-01-30 DIAGNOSIS — K8020 Calculus of gallbladder without cholecystitis without obstruction: Secondary | ICD-10-CM | POA: Diagnosis not present

## 2017-01-30 DIAGNOSIS — I2510 Atherosclerotic heart disease of native coronary artery without angina pectoris: Secondary | ICD-10-CM | POA: Diagnosis not present

## 2017-01-30 DIAGNOSIS — Z7902 Long term (current) use of antithrombotics/antiplatelets: Secondary | ICD-10-CM | POA: Diagnosis not present

## 2017-01-30 DIAGNOSIS — I249 Acute ischemic heart disease, unspecified: Secondary | ICD-10-CM | POA: Diagnosis not present

## 2017-01-30 DIAGNOSIS — J449 Chronic obstructive pulmonary disease, unspecified: Secondary | ICD-10-CM | POA: Diagnosis not present

## 2017-01-30 DIAGNOSIS — F418 Other specified anxiety disorders: Secondary | ICD-10-CM | POA: Diagnosis not present

## 2017-01-30 DIAGNOSIS — K810 Acute cholecystitis: Secondary | ICD-10-CM | POA: Diagnosis not present

## 2017-01-31 DIAGNOSIS — F419 Anxiety disorder, unspecified: Secondary | ICD-10-CM | POA: Diagnosis not present

## 2017-01-31 DIAGNOSIS — I2510 Atherosclerotic heart disease of native coronary artery without angina pectoris: Secondary | ICD-10-CM | POA: Diagnosis not present

## 2017-01-31 DIAGNOSIS — I639 Cerebral infarction, unspecified: Secondary | ICD-10-CM | POA: Diagnosis not present

## 2017-01-31 DIAGNOSIS — K810 Acute cholecystitis: Secondary | ICD-10-CM | POA: Diagnosis not present

## 2017-01-31 DIAGNOSIS — F329 Major depressive disorder, single episode, unspecified: Secondary | ICD-10-CM | POA: Diagnosis not present

## 2017-01-31 DIAGNOSIS — I249 Acute ischemic heart disease, unspecified: Secondary | ICD-10-CM | POA: Diagnosis not present

## 2017-01-31 DIAGNOSIS — I10 Essential (primary) hypertension: Secondary | ICD-10-CM | POA: Diagnosis not present

## 2017-02-08 DIAGNOSIS — Z09 Encounter for follow-up examination after completed treatment for conditions other than malignant neoplasm: Secondary | ICD-10-CM | POA: Diagnosis not present
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
## 2017-02-14 ENCOUNTER — Ambulatory Visit: Payer: Self-pay | Admitting: Cardiology

## 2017-02-20 DIAGNOSIS — E039 Hypothyroidism, unspecified: Secondary | ICD-10-CM | POA: Diagnosis not present

## 2017-02-20 DIAGNOSIS — I10 Essential (primary) hypertension: Secondary | ICD-10-CM | POA: Diagnosis not present

## 2017-02-20 DIAGNOSIS — I2510 Atherosclerotic heart disease of native coronary artery without angina pectoris: Secondary | ICD-10-CM | POA: Diagnosis not present

## 2017-02-20 DIAGNOSIS — G629 Polyneuropathy, unspecified: Secondary | ICD-10-CM | POA: Diagnosis not present

## 2017-02-20 DIAGNOSIS — Z85818 Personal history of malignant neoplasm of other sites of lip, oral cavity, and pharynx: Secondary | ICD-10-CM | POA: Diagnosis not present

## 2017-02-20 DIAGNOSIS — I252 Old myocardial infarction: Secondary | ICD-10-CM | POA: Diagnosis not present

## 2017-03-11 DIAGNOSIS — Z136 Encounter for screening for cardiovascular disorders: Secondary | ICD-10-CM | POA: Diagnosis not present

## 2017-03-11 DIAGNOSIS — Z1389 Encounter for screening for other disorder: Secondary | ICD-10-CM | POA: Diagnosis not present

## 2017-03-11 DIAGNOSIS — Z1331 Encounter for screening for depression: Secondary | ICD-10-CM | POA: Diagnosis not present

## 2017-03-11 DIAGNOSIS — I10 Essential (primary) hypertension: Secondary | ICD-10-CM | POA: Diagnosis not present

## 2017-03-11 DIAGNOSIS — Z0000 Encounter for general adult medical examination without abnormal findings: Secondary | ICD-10-CM | POA: Diagnosis not present

## 2017-03-11 DIAGNOSIS — Z9181 History of falling: Secondary | ICD-10-CM | POA: Diagnosis not present

## 2017-03-11 DIAGNOSIS — E663 Overweight: Secondary | ICD-10-CM | POA: Diagnosis not present

## 2017-03-11 DIAGNOSIS — E785 Hyperlipidemia, unspecified: Secondary | ICD-10-CM | POA: Diagnosis not present

## 2017-03-11 DIAGNOSIS — Z125 Encounter for screening for malignant neoplasm of prostate: Secondary | ICD-10-CM | POA: Diagnosis not present

## 2017-04-23 NOTE — Telephone Encounter (Signed)
Repatha patient assistance has been approved through [employer] for 2019. Patient is aware.

## 2017-04-24 DIAGNOSIS — J40 Bronchitis, not specified as acute or chronic: Secondary | ICD-10-CM | POA: Diagnosis not present

## 2017-04-24 DIAGNOSIS — J209 Acute bronchitis, unspecified: Secondary | ICD-10-CM | POA: Diagnosis not present

## 2017-04-29 DIAGNOSIS — J40 Bronchitis, not specified as acute or chronic: Secondary | ICD-10-CM | POA: Diagnosis not present

## 2017-04-29 DIAGNOSIS — J22 Unspecified acute lower respiratory infection: Secondary | ICD-10-CM | POA: Diagnosis not present

## 2017-05-08 DIAGNOSIS — R079 Chest pain, unspecified: Secondary | ICD-10-CM | POA: Diagnosis not present

## 2017-05-08 DIAGNOSIS — J22 Unspecified acute lower respiratory infection: Secondary | ICD-10-CM | POA: Diagnosis not present

## 2017-05-08 DIAGNOSIS — J40 Bronchitis, not specified as acute or chronic: Secondary | ICD-10-CM | POA: Diagnosis not present

## 2017-05-08 DIAGNOSIS — I519 Heart disease, unspecified: Secondary | ICD-10-CM | POA: Diagnosis not present

## 2017-05-20 DIAGNOSIS — D485 Neoplasm of uncertain behavior of skin: Secondary | ICD-10-CM | POA: Diagnosis not present

## 2017-05-21 DIAGNOSIS — L905 Scar conditions and fibrosis of skin: Secondary | ICD-10-CM | POA: Diagnosis not present

## 2017-05-21 DIAGNOSIS — L820 Inflamed seborrheic keratosis: Secondary | ICD-10-CM | POA: Diagnosis not present

## 2017-05-21 DIAGNOSIS — C44629 Squamous cell carcinoma of skin of left upper limb, including shoulder: Secondary | ICD-10-CM | POA: Diagnosis not present

## 2017-07-09 DIAGNOSIS — L089 Local infection of the skin and subcutaneous tissue, unspecified: Secondary | ICD-10-CM | POA: Diagnosis not present

## 2017-07-09 DIAGNOSIS — C44629 Squamous cell carcinoma of skin of left upper limb, including shoulder: Secondary | ICD-10-CM | POA: Diagnosis not present

## 2017-07-10 DIAGNOSIS — E890 Postprocedural hypothyroidism: Secondary | ICD-10-CM | POA: Diagnosis not present

## 2017-07-10 DIAGNOSIS — G8929 Other chronic pain: Secondary | ICD-10-CM | POA: Diagnosis not present

## 2017-07-10 DIAGNOSIS — Z923 Personal history of irradiation: Secondary | ICD-10-CM | POA: Diagnosis not present

## 2017-07-10 DIAGNOSIS — Z85818 Personal history of malignant neoplasm of other sites of lip, oral cavity, and pharynx: Secondary | ICD-10-CM | POA: Diagnosis not present

## 2017-07-10 DIAGNOSIS — Z9221 Personal history of antineoplastic chemotherapy: Secondary | ICD-10-CM | POA: Diagnosis not present

## 2017-07-10 DIAGNOSIS — E039 Hypothyroidism, unspecified: Secondary | ICD-10-CM | POA: Diagnosis not present

## 2017-07-10 DIAGNOSIS — L989 Disorder of the skin and subcutaneous tissue, unspecified: Secondary | ICD-10-CM | POA: Diagnosis not present

## 2017-07-17 DIAGNOSIS — Z23 Encounter for immunization: Secondary | ICD-10-CM | POA: Diagnosis not present

## 2017-07-17 DIAGNOSIS — E039 Hypothyroidism, unspecified: Secondary | ICD-10-CM | POA: Diagnosis not present

## 2017-07-17 DIAGNOSIS — E7800 Pure hypercholesterolemia, unspecified: Secondary | ICD-10-CM | POA: Diagnosis not present

## 2017-07-17 DIAGNOSIS — I10 Essential (primary) hypertension: Secondary | ICD-10-CM | POA: Diagnosis not present

## 2017-07-17 DIAGNOSIS — I2510 Atherosclerotic heart disease of native coronary artery without angina pectoris: Secondary | ICD-10-CM | POA: Diagnosis not present

## 2017-07-17 DIAGNOSIS — M542 Cervicalgia: Secondary | ICD-10-CM | POA: Diagnosis not present

## 2017-07-17 DIAGNOSIS — J449 Chronic obstructive pulmonary disease, unspecified: Secondary | ICD-10-CM | POA: Diagnosis not present

## 2017-07-23 DIAGNOSIS — Z6826 Body mass index (BMI) 26.0-26.9, adult: Secondary | ICD-10-CM | POA: Diagnosis not present

## 2017-07-23 DIAGNOSIS — J449 Chronic obstructive pulmonary disease, unspecified: Secondary | ICD-10-CM | POA: Diagnosis not present

## 2017-07-23 DIAGNOSIS — L729 Follicular cyst of the skin and subcutaneous tissue, unspecified: Secondary | ICD-10-CM | POA: Diagnosis not present

## 2017-07-29 ENCOUNTER — Telehealth: Payer: Self-pay | Admitting: *Deleted

## 2017-07-29 NOTE — Telephone Encounter (Addendum)
Received a paper refill request from [facility]. Pt is overdue for follow up with Cardiology, pt was last seen by Bonney Leitz on 07/03/16 & canceled last appt with Mickel Baas on 02/14/17. Left message for pt to callback to get an appt scheduled.  Eliquis 5mg  refill request was received, requesting 180 tablets. Pt is 57 yrs old, wt-92.7kg, Crea-1.00 on 01/13/17; since pt is overdue for Cardiology appt will await for pt to schedule an appt.   Pt called back and made an appt for Dr. Marlou Porch on tomorrow. Since pt has canceled appts in the past will await for pt to come to his appt tomorrow then refill Eliquis at that time.

## 2017-07-30 ENCOUNTER — Ambulatory Visit: Payer: PPO | Admitting: Cardiology

## 2017-07-30 ENCOUNTER — Encounter: Payer: Self-pay | Admitting: Cardiology

## 2017-07-30 VITALS — BP 136/70 | HR 90 | Ht 75.0 in | Wt 197.0 lb

## 2017-07-30 DIAGNOSIS — E7800 Pure hypercholesterolemia, unspecified: Secondary | ICD-10-CM

## 2017-07-30 DIAGNOSIS — I2510 Atherosclerotic heart disease of native coronary artery without angina pectoris: Secondary | ICD-10-CM | POA: Diagnosis not present

## 2017-07-30 DIAGNOSIS — I779 Disorder of arteries and arterioles, unspecified: Secondary | ICD-10-CM

## 2017-07-30 DIAGNOSIS — I480 Paroxysmal atrial fibrillation: Secondary | ICD-10-CM

## 2017-07-30 DIAGNOSIS — I739 Peripheral vascular disease, unspecified: Secondary | ICD-10-CM

## 2017-07-30 DIAGNOSIS — Z9861 Coronary angioplasty status: Secondary | ICD-10-CM

## 2017-07-30 MED ORDER — APIXABAN 5 MG PO TABS: 5.0000 mg | ORAL_TABLET | Freq: Two times a day (BID) | ORAL | 11 refills | Status: DC

## 2017-07-30 NOTE — Telephone Encounter (Signed)
Pt came in for OV with Dr. Marlou Porch today and Eliquis Rx was sent to Pharmacy per Jeannene Patella, RN.
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
## 2017-07-30 NOTE — Patient Instructions (Signed)

## 2017-09-16 DIAGNOSIS — M25521 Pain in right elbow: Secondary | ICD-10-CM | POA: Diagnosis not present

## 2017-09-16 DIAGNOSIS — Z6825 Body mass index (BMI) 25.0-25.9, adult: Secondary | ICD-10-CM | POA: Diagnosis not present

## 2017-09-23 DIAGNOSIS — R21 Rash and other nonspecific skin eruption: Secondary | ICD-10-CM | POA: Diagnosis not present

## 2017-09-23 DIAGNOSIS — Z6825 Body mass index (BMI) 25.0-25.9, adult: Secondary | ICD-10-CM | POA: Diagnosis not present

## 2017-09-24 ENCOUNTER — Ambulatory Visit (INDEPENDENT_AMBULATORY_CARE_PROVIDER_SITE_OTHER): Payer: Self-pay | Admitting: Orthopaedic Surgery

## 2017-09-27 ENCOUNTER — Ambulatory Visit (INDEPENDENT_AMBULATORY_CARE_PROVIDER_SITE_OTHER): Payer: PPO

## 2017-09-27 ENCOUNTER — Ambulatory Visit (INDEPENDENT_AMBULATORY_CARE_PROVIDER_SITE_OTHER): Payer: PPO | Admitting: Orthopaedic Surgery

## 2017-09-27 DIAGNOSIS — M25521 Pain in right elbow: Secondary | ICD-10-CM | POA: Diagnosis not present
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
## 2017-09-29 DIAGNOSIS — R079 Chest pain, unspecified: Secondary | ICD-10-CM | POA: Diagnosis not present

## 2017-09-29 DIAGNOSIS — R110 Nausea: Secondary | ICD-10-CM | POA: Diagnosis not present

## 2017-09-29 DIAGNOSIS — Z8673 Personal history of transient ischemic attack (TIA), and cerebral infarction without residual deficits: Secondary | ICD-10-CM | POA: Diagnosis not present

## 2017-09-29 DIAGNOSIS — Z95820 Peripheral vascular angioplasty status with implants and grafts: Secondary | ICD-10-CM | POA: Diagnosis not present

## 2017-09-29 DIAGNOSIS — I2510 Atherosclerotic heart disease of native coronary artery without angina pectoris: Secondary | ICD-10-CM | POA: Diagnosis not present

## 2017-09-29 DIAGNOSIS — Z85818 Personal history of malignant neoplasm of other sites of lip, oral cavity, and pharynx: Secondary | ICD-10-CM | POA: Diagnosis not present

## 2017-09-29 DIAGNOSIS — Z87891 Personal history of nicotine dependence: Secondary | ICD-10-CM | POA: Diagnosis not present

## 2017-09-29 DIAGNOSIS — I480 Paroxysmal atrial fibrillation: Secondary | ICD-10-CM | POA: Diagnosis not present

## 2017-09-29 DIAGNOSIS — I25110 Atherosclerotic heart disease of native coronary artery with unstable angina pectoris: Secondary | ICD-10-CM | POA: Diagnosis not present

## 2017-09-29 DIAGNOSIS — E785 Hyperlipidemia, unspecified: Secondary | ICD-10-CM | POA: Diagnosis not present

## 2017-09-29 DIAGNOSIS — Z955 Presence of coronary angioplasty implant and graft: Secondary | ICD-10-CM | POA: Diagnosis not present

## 2017-09-29 DIAGNOSIS — Z888 Allergy status to other drugs, medicaments and biological substances status: Secondary | ICD-10-CM | POA: Diagnosis not present

## 2017-09-29 DIAGNOSIS — I200 Unstable angina: Secondary | ICD-10-CM | POA: Diagnosis not present

## 2017-09-29 DIAGNOSIS — Z7901 Long term (current) use of anticoagulants: Secondary | ICD-10-CM | POA: Diagnosis not present

## 2017-09-29 DIAGNOSIS — Z7982 Long term (current) use of aspirin: Secondary | ICD-10-CM | POA: Diagnosis not present

## 2017-09-29 DIAGNOSIS — I739 Peripheral vascular disease, unspecified: Secondary | ICD-10-CM | POA: Diagnosis not present

## 2017-09-29 DIAGNOSIS — E039 Hypothyroidism, unspecified: Secondary | ICD-10-CM | POA: Diagnosis not present

## 2017-09-29 DIAGNOSIS — R61 Generalized hyperhidrosis: Secondary | ICD-10-CM | POA: Diagnosis not present

## 2017-09-29 DIAGNOSIS — E7800 Pure hypercholesterolemia, unspecified: Secondary | ICD-10-CM | POA: Diagnosis not present

## 2017-09-29 DIAGNOSIS — I214 Non-ST elevation (NSTEMI) myocardial infarction: Secondary | ICD-10-CM | POA: Diagnosis not present

## 2017-09-30 DIAGNOSIS — I480 Paroxysmal atrial fibrillation: Secondary | ICD-10-CM | POA: Diagnosis not present

## 2017-09-30 DIAGNOSIS — Z955 Presence of coronary angioplasty implant and graft: Secondary | ICD-10-CM | POA: Diagnosis not present

## 2017-09-30 DIAGNOSIS — I25110 Atherosclerotic heart disease of native coronary artery with unstable angina pectoris: Secondary | ICD-10-CM | POA: Diagnosis not present

## 2017-09-30 DIAGNOSIS — I2510 Atherosclerotic heart disease of native coronary artery without angina pectoris: Secondary | ICD-10-CM | POA: Diagnosis not present

## 2017-09-30 DIAGNOSIS — E785 Hyperlipidemia, unspecified: Secondary | ICD-10-CM | POA: Diagnosis not present

## 2017-09-30 DIAGNOSIS — Z7901 Long term (current) use of anticoagulants: Secondary | ICD-10-CM | POA: Diagnosis not present

## 2017-09-30 DIAGNOSIS — Z859 Personal history of malignant neoplasm, unspecified: Secondary | ICD-10-CM | POA: Diagnosis not present

## 2017-09-30 DIAGNOSIS — Z85818 Personal history of malignant neoplasm of other sites of lip, oral cavity, and pharynx: Secondary | ICD-10-CM | POA: Diagnosis not present

## 2017-09-30 DIAGNOSIS — I739 Peripheral vascular disease, unspecified: Secondary | ICD-10-CM | POA: Diagnosis not present

## 2017-09-30 DIAGNOSIS — Z8673 Personal history of transient ischemic attack (TIA), and cerebral infarction without residual deficits: Secondary | ICD-10-CM | POA: Diagnosis not present

## 2017-09-30 DIAGNOSIS — R079 Chest pain, unspecified: Secondary | ICD-10-CM | POA: Diagnosis not present

## 2017-09-30 DIAGNOSIS — Z95820 Peripheral vascular angioplasty status with implants and grafts: Secondary | ICD-10-CM | POA: Diagnosis not present

## 2017-09-30 DIAGNOSIS — E039 Hypothyroidism, unspecified: Secondary | ICD-10-CM | POA: Diagnosis not present

## 2017-09-30 DIAGNOSIS — Z7982 Long term (current) use of aspirin: Secondary | ICD-10-CM | POA: Diagnosis not present

## 2017-09-30 DIAGNOSIS — F17211 Nicotine dependence, cigarettes, in remission: Secondary | ICD-10-CM | POA: Diagnosis not present

## 2017-10-01 DIAGNOSIS — I480 Paroxysmal atrial fibrillation: Secondary | ICD-10-CM | POA: Diagnosis not present

## 2017-10-01 DIAGNOSIS — R001 Bradycardia, unspecified: Secondary | ICD-10-CM | POA: Diagnosis not present

## 2017-10-01 DIAGNOSIS — I482 Chronic atrial fibrillation: Secondary | ICD-10-CM | POA: Diagnosis not present

## 2017-10-01 DIAGNOSIS — E785 Hyperlipidemia, unspecified: Secondary | ICD-10-CM | POA: Diagnosis not present

## 2017-10-01 DIAGNOSIS — E7800 Pure hypercholesterolemia, unspecified: Secondary | ICD-10-CM | POA: Diagnosis not present

## 2017-10-01 DIAGNOSIS — Z7901 Long term (current) use of anticoagulants: Secondary | ICD-10-CM | POA: Diagnosis not present

## 2017-10-01 DIAGNOSIS — I214 Non-ST elevation (NSTEMI) myocardial infarction: Secondary | ICD-10-CM | POA: Diagnosis not present

## 2017-10-01 DIAGNOSIS — Z955 Presence of coronary angioplasty implant and graft: Secondary | ICD-10-CM | POA: Diagnosis not present

## 2017-10-01 DIAGNOSIS — Z7982 Long term (current) use of aspirin: Secondary | ICD-10-CM | POA: Diagnosis not present

## 2017-10-01 DIAGNOSIS — E039 Hypothyroidism, unspecified: Secondary | ICD-10-CM | POA: Diagnosis not present

## 2017-10-01 DIAGNOSIS — I200 Unstable angina: Secondary | ICD-10-CM | POA: Diagnosis not present

## 2017-10-01 DIAGNOSIS — R079 Chest pain, unspecified: Secondary | ICD-10-CM | POA: Diagnosis not present

## 2017-10-01 MED ORDER — LISINOPRIL 20 MG PO TABS
40.00 | ORAL_TABLET | ORAL | Status: DC
Start: 2017-10-01 — End: 2017-10-01

## 2017-10-01 MED ORDER — ZOLPIDEM TARTRATE 5 MG PO TABS
5.00 | ORAL_TABLET | ORAL | Status: DC
Start: ? — End: 2017-10-01

## 2017-10-01 MED ORDER — TRAZODONE HCL 100 MG PO TABS
200.00 | ORAL_TABLET | ORAL | Status: DC
Start: ? — End: 2017-10-01

## 2017-10-01 MED ORDER — GUAIFENESIN-DM 100-10 MG/5ML PO SYRP
10.00 | ORAL_SOLUTION | ORAL | Status: DC
Start: ? — End: 2017-10-01

## 2017-10-01 MED ORDER — EZETIMIBE 10 MG PO TABS
10.00 | ORAL_TABLET | ORAL | Status: DC
Start: ? — End: 2017-10-01

## 2017-10-01 MED ORDER — CITALOPRAM HYDROBROMIDE 10 MG PO TABS
15.00 | ORAL_TABLET | ORAL | Status: DC
Start: 2017-10-02 — End: 2017-10-01

## 2017-10-01 MED ORDER — GENERIC EXTERNAL MEDICATION
1.50 | Status: DC
Start: 2017-10-01 — End: 2017-10-01

## 2017-10-01 MED ORDER — APIXABAN 5 MG PO TABS
5.00 | ORAL_TABLET | ORAL | Status: DC
Start: 2017-10-01 — End: 2017-10-01

## 2017-10-01 MED ORDER — ALUMINUM-MAGNESIUM-SIMETHICONE 200-200-20 MG/5ML PO SUSP
60.00 | ORAL | Status: DC
Start: ? — End: 2017-10-01

## 2017-10-01 MED ORDER — ALBUTEROL SULFATE HFA 108 (90 BASE) MCG/ACT IN AERS
1.00 | INHALATION_SPRAY | RESPIRATORY_TRACT | Status: DC
Start: ? — End: 2017-10-01

## 2017-10-01 MED ORDER — SODIUM CHLORIDE 0.9 % IJ SOLN
5.00 | INTRAMUSCULAR | Status: DC
Start: ? — End: 2017-10-01

## 2017-10-01 MED ORDER — LEVOTHYROXINE SODIUM 100 MCG PO TABS
100.00 | ORAL_TABLET | ORAL | Status: DC
Start: 2017-10-02 — End: 2017-10-01

## 2017-10-01 MED ORDER — ACETAMINOPHEN 325 MG PO TABS
650.00 | ORAL_TABLET | ORAL | Status: DC
Start: ? — End: 2017-10-01

## 2017-10-01 MED ORDER — SODIUM CHLORIDE 0.9 % IJ SOLN
5.00 | INTRAMUSCULAR | Status: DC
Start: 2017-10-01 — End: 2017-10-01

## 2017-10-01 MED ORDER — BISACODYL 5 MG PO TBEC
10.00 | DELAYED_RELEASE_TABLET | ORAL | Status: DC
Start: ? — End: 2017-10-01

## 2017-10-01 MED ORDER — PANTOPRAZOLE SODIUM 40 MG PO TBEC
40.00 | DELAYED_RELEASE_TABLET | ORAL | Status: DC
Start: 2017-10-02 — End: 2017-10-01

## 2017-10-01 MED ORDER — CLOPIDOGREL BISULFATE 75 MG PO TABS
75.00 | ORAL_TABLET | ORAL | Status: DC
Start: 2017-10-02 — End: 2017-10-01

## 2017-10-01 MED ORDER — FLUTICASONE FUROATE-VILANTEROL 100-25 MCG/INH IN AEPB
1.00 | INHALATION_SPRAY | RESPIRATORY_TRACT | Status: DC
Start: 2017-10-02 — End: 2017-10-01

## 2017-10-01 MED ORDER — HYDROCODONE-ACETAMINOPHEN 10-325 MG PO TABS
1.00 | ORAL_TABLET | ORAL | Status: DC
Start: ? — End: 2017-10-01

## 2017-10-03 ENCOUNTER — Other Ambulatory Visit: Payer: Self-pay

## 2017-10-03 NOTE — Patient Outreach (Signed)
[facility]) Care Management  10/03/2017  Bobby Floyd [birth year] [id number]     Transition of Care Referral  Referral Date: 10/03/17 Referral Source: [facility] Discharge Report Date of Admission: unknown Diagnosis: "chest pain, unspecified" Date of Discharge: 10/01/17 Facility:[facility] Insurance: HTA   Referral received. No outreach warranted at this time. TOC will be completed by primary care provider office [facility]) who will refer to [facility] care mgmt if needed.     Plan: RN CM will close case at this time.    Enzo Montgomery, RN,BSN,CCM [place] [facility] Telephonic Care Management Coordinator Direct Phone: [phone] Toll Free: [phone] Fax: [phone]

## 2017-10-07 DIAGNOSIS — Z09 Encounter for follow-up examination after completed treatment for conditions other than malignant neoplasm: Secondary | ICD-10-CM | POA: Diagnosis not present

## 2017-10-07 DIAGNOSIS — I10 Essential (primary) hypertension: Secondary | ICD-10-CM | POA: Diagnosis not present

## 2017-10-07 DIAGNOSIS — Z6824 Body mass index (BMI) 24.0-24.9, adult: Secondary | ICD-10-CM | POA: Diagnosis not present

## 2017-10-07 DIAGNOSIS — E7800 Pure hypercholesterolemia, unspecified: Secondary | ICD-10-CM | POA: Diagnosis not present

## 2017-10-07 DIAGNOSIS — I219 Acute myocardial infarction, unspecified: Secondary | ICD-10-CM | POA: Diagnosis not present

## 2017-10-23 DIAGNOSIS — Z6824 Body mass index (BMI) 24.0-24.9, adult: Secondary | ICD-10-CM | POA: Diagnosis not present

## 2017-10-23 DIAGNOSIS — I2510 Atherosclerotic heart disease of native coronary artery without angina pectoris: Secondary | ICD-10-CM | POA: Diagnosis not present

## 2017-10-23 DIAGNOSIS — I10 Essential (primary) hypertension: Secondary | ICD-10-CM | POA: Diagnosis not present

## 2017-10-23 DIAGNOSIS — F419 Anxiety disorder, unspecified: Secondary | ICD-10-CM | POA: Diagnosis not present

## 2017-10-24 ENCOUNTER — Ambulatory Visit (INDEPENDENT_AMBULATORY_CARE_PROVIDER_SITE_OTHER): Payer: PPO | Admitting: Physical Medicine and Rehabilitation

## 2017-10-24 ENCOUNTER — Encounter (INDEPENDENT_AMBULATORY_CARE_PROVIDER_SITE_OTHER): Payer: Self-pay | Admitting: Physical Medicine and Rehabilitation

## 2017-10-24 DIAGNOSIS — M25521 Pain in right elbow: Secondary | ICD-10-CM

## 2017-10-24 DIAGNOSIS — R202 Paresthesia of skin: Secondary | ICD-10-CM

## 2017-10-24 NOTE — Procedures (Signed)
EMG & NCV Findings: Evaluation of the right median motor nerve showed prolonged distal onset latency (4.4 ms).  The right median (across palm) sensory nerve showed prolonged distal peak latency (Wrist, 4.2 ms), reduced amplitude (8.4 V), and prolonged distal peak latency (Palm, 2.3 ms).  The right ulnar sensory nerve showed reduced amplitude (10.4 V).  All remaining nerves (as indicated in the following tables) were within normal limits.    Findings: All examined muscles (as indicated in the following table) showed no evidence of electrical instability.    This electrodiagnostic study is ABNORMAL and demonstrates a moderate median nerve neuropathy at the wrist (carpal tunnel syndrome) with sensory and motor demyelination.  Careful clinical correlation is paramount.  There is no electrodiagnostic evidence of other focal entrapment or neuropathy, cervical radiculopathy or brachial plexopathy.   As you know, this particular electrodiagnostic study cannot rule out chemical radiculitis or sensory only radiculopathy.  ___________________________ Laurence Spates FAAPMR Board Certified, American Board of Physical Medicine and Rehabilitation    Nerve Conduction Studies Anti Sensory Summary Table   Stim Site NR Peak (ms) Norm Peak (ms) P-T Amp (V) Norm P-T Amp Site1 Site2 Delta-P (ms) Dist (cm) Vel (m/s) Norm Vel (m/s)  Right Median Acr Palm Anti Sensory (2nd Digit)  32.3C  Wrist    *4.2 <3.6 *8.4 >10 Wrist Palm 1.9 0.0    Palm    *2.3 <2.0 13.4         Right Radial Anti Sensory (Base 1st Digit)  32.4C  Wrist    2.3 <3.1 12.5  Wrist Base 1st Digit 2.3 0.0    Right Ulnar Anti Sensory (5th Digit)  32.4C  Wrist    3.4 <3.7 *10.4 >15.0 Wrist 5th Digit 3.4 14.0 41 >38   Motor Summary Table   Stim Site NR Onset (ms) Norm Onset (ms) O-P Amp (mV) Norm O-P Amp Site1 Site2 Delta-0 (ms) Dist (cm) Vel (m/s) Norm Vel (m/s)  Right Median Motor (Abd Poll Brev)  32.6C  Wrist    *4.4 <4.2 7.0 >5 Elbow  Wrist 4.7 24.0 51 >50  Elbow    9.1  6.9         Right Ulnar Motor (Abd Dig Min)  32.8C  Wrist    3.4 <4.2 10.1 >3 B Elbow Wrist 3.9 22.5 58 >53  B Elbow    7.3  9.8  A Elbow B Elbow 1.5 10.0 67 >53  A Elbow    8.8  9.6          EMG   Side Muscle Nerve Root Ins Act Fibs Psw Amp Dur Poly Recrt Int Fraser Din Comment  Right Abd Poll Brev Median C8-T1 Nml Nml Nml Nml Nml 0 Nml Nml   Right 1stDorInt Ulnar C8-T1 Nml Nml Nml Nml Nml 0 Nml Nml   Right PronatorTeres Median C6-7 Nml Nml Nml Nml Nml 0 Nml Nml   Right Biceps Musculocut C5-6 Nml Nml Nml Nml Nml 0 Nml Nml   Right Deltoid Axillary C5-6 Nml Nml Nml Nml Nml 0 Nml Nml     Nerve Conduction Studies Anti Sensory Left/Right Comparison   Stim Site L Lat (ms) R Lat (ms) L-R Lat (ms) L Amp (V) R Amp (V) L-R Amp (%) Site1 Site2 L Vel (m/s) R Vel (m/s) L-R Vel (m/s)  Median Acr Palm Anti Sensory (2nd Digit)  32.3C  Wrist  *4.2   *8.4  Wrist Palm     Palm  *2.3   13.4  Radial Anti Sensory (Base 1st Digit)  32.4C  Wrist  2.3   12.5  Wrist Base 1st Digit     Ulnar Anti Sensory (5th Digit)  32.4C  Wrist  3.4   *10.4  Wrist 5th Digit  41    Motor Left/Right Comparison   Stim Site L Lat (ms) R Lat (ms) L-R Lat (ms) L Amp (mV) R Amp (mV) L-R Amp (%) Site1 Site2 L Vel (m/s) R Vel (m/s) L-R Vel (m/s)  Median Motor (Abd Poll Brev)  32.6C  Wrist  *4.4   7.0  Elbow Wrist  51   Elbow  9.1   6.9        Ulnar Motor (Abd Dig Min)  32.8C  Wrist  3.4   10.1  B Elbow Wrist  58   B Elbow  7.3   9.8  A Elbow B Elbow  67   A Elbow  8.8   9.6           Waveforms:

## 2017-10-24 NOTE — Progress Notes (Signed)
.  Numeric Pain Rating Scale and Functional Assessment Average Pain 10   In the last MONTH (on 0-10 scale) has pain interfered with the following?  1. General activity like being  able to carry out your everyday physical activities such as walking, climbing stairs, carrying groceries, or moving a chair?  Rating(8)    
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
## 2017-10-31 ENCOUNTER — Ambulatory Visit: Payer: Self-pay | Admitting: Cardiology

## 2017-10-31 ENCOUNTER — Ambulatory Visit (INDEPENDENT_AMBULATORY_CARE_PROVIDER_SITE_OTHER): Payer: PPO | Admitting: Orthopaedic Surgery

## 2017-10-31 ENCOUNTER — Encounter (HOSPITAL_COMMUNITY): Payer: Self-pay | Admitting: Anesthesiology

## 2017-10-31 DIAGNOSIS — G5601 Carpal tunnel syndrome, right upper limb: Secondary | ICD-10-CM
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
## 2017-10-31 NOTE — Progress Notes (Signed)
Patient with longstanding hx CAD, has had multiple stent placements. Patient admitted to [facility] with chest pain, NSTEMI, had cardiac cath on 09-30-17 with PTCA done. Patient currently on Eliquis and Plavix for stents, CVA and A-fib. Chart reviewed with Dr Gifford Shave, will need cardiac clearance prior to surgery. Sherrie at Dr Phoebe Sharps office notified.

## 2017-11-04 DIAGNOSIS — Z7901 Long term (current) use of anticoagulants: Secondary | ICD-10-CM | POA: Diagnosis not present

## 2017-11-04 DIAGNOSIS — Z7982 Long term (current) use of aspirin: Secondary | ICD-10-CM | POA: Diagnosis not present

## 2017-11-04 DIAGNOSIS — E785 Hyperlipidemia, unspecified: Secondary | ICD-10-CM | POA: Diagnosis not present

## 2017-11-04 DIAGNOSIS — I480 Paroxysmal atrial fibrillation: Secondary | ICD-10-CM | POA: Diagnosis not present

## 2017-11-04 DIAGNOSIS — I2510 Atherosclerotic heart disease of native coronary artery without angina pectoris: Secondary | ICD-10-CM | POA: Diagnosis not present

## 2017-11-06 ENCOUNTER — Encounter (HOSPITAL_BASED_OUTPATIENT_CLINIC_OR_DEPARTMENT_OTHER): Admission: RE | Payer: Self-pay | Source: Ambulatory Visit

## 2017-11-06 ENCOUNTER — Ambulatory Visit (HOSPITAL_BASED_OUTPATIENT_CLINIC_OR_DEPARTMENT_OTHER): Admission: RE | Admit: 2017-11-06 | Payer: PPO | Source: Ambulatory Visit | Admitting: Orthopaedic Surgery

## 2017-11-06 SURGERY — CARPAL TUNNEL RELEASE
Anesthesia: Regional | Laterality: Right

## 2017-11-12 ENCOUNTER — Encounter (HOSPITAL_BASED_OUTPATIENT_CLINIC_OR_DEPARTMENT_OTHER): Payer: Self-pay

## 2017-11-12 ENCOUNTER — Ambulatory Visit (INDEPENDENT_AMBULATORY_CARE_PROVIDER_SITE_OTHER): Payer: PPO | Admitting: Orthopaedic Surgery

## 2017-11-12 ENCOUNTER — Other Ambulatory Visit: Payer: Self-pay

## 2017-11-15 ENCOUNTER — Encounter (HOSPITAL_BASED_OUTPATIENT_CLINIC_OR_DEPARTMENT_OTHER)
Admission: RE | Disposition: A | Discharge status: Home / Self Care | Payer: Self-pay | Source: Ambulatory Visit | Attending: Orthopaedic Surgery

## 2017-11-15 ENCOUNTER — Ambulatory Visit (HOSPITAL_BASED_OUTPATIENT_CLINIC_OR_DEPARTMENT_OTHER): Payer: PPO | Admitting: Anesthesiology

## 2017-11-15 ENCOUNTER — Ambulatory Visit (HOSPITAL_BASED_OUTPATIENT_CLINIC_OR_DEPARTMENT_OTHER)
Admission: RE | Admit: 2017-11-15 | Discharge: 2017-11-15 | Disposition: A | Discharge status: Home / Self Care | Payer: PPO | Source: Ambulatory Visit | Attending: Orthopaedic Surgery | Admitting: Orthopaedic Surgery

## 2017-11-15 ENCOUNTER — Other Ambulatory Visit: Payer: Self-pay

## 2017-11-15 ENCOUNTER — Encounter (HOSPITAL_BASED_OUTPATIENT_CLINIC_OR_DEPARTMENT_OTHER): Payer: Self-pay | Admitting: *Deleted

## 2017-11-15 DIAGNOSIS — Z7901 Long term (current) use of anticoagulants: Secondary | ICD-10-CM | POA: Diagnosis not present

## 2017-11-15 DIAGNOSIS — E039 Hypothyroidism, unspecified: Secondary | ICD-10-CM | POA: Insufficient documentation

## 2017-11-15 DIAGNOSIS — I4891 Unspecified atrial fibrillation: Secondary | ICD-10-CM | POA: Insufficient documentation

## 2017-11-15 DIAGNOSIS — G620 Drug-induced polyneuropathy: Secondary | ICD-10-CM | POA: Diagnosis not present

## 2017-11-15 DIAGNOSIS — I2510 Atherosclerotic heart disease of native coronary artery without angina pectoris: Secondary | ICD-10-CM | POA: Diagnosis not present

## 2017-11-15 DIAGNOSIS — F329 Major depressive disorder, single episode, unspecified: Secondary | ICD-10-CM | POA: Insufficient documentation

## 2017-11-15 DIAGNOSIS — G5601 Carpal tunnel syndrome, right upper limb: Secondary | ICD-10-CM | POA: Diagnosis not present

## 2017-11-15 DIAGNOSIS — K219 Gastro-esophageal reflux disease without esophagitis: Secondary | ICD-10-CM | POA: Diagnosis not present

## 2017-11-15 DIAGNOSIS — Z79890 Hormone replacement therapy (postmenopausal): Secondary | ICD-10-CM | POA: Insufficient documentation

## 2017-11-15 DIAGNOSIS — E785 Hyperlipidemia, unspecified: Secondary | ICD-10-CM | POA: Insufficient documentation

## 2017-11-15 DIAGNOSIS — I252 Old myocardial infarction: Secondary | ICD-10-CM | POA: Diagnosis not present

## 2017-11-15 DIAGNOSIS — Z885 Allergy status to narcotic agent status: Secondary | ICD-10-CM | POA: Diagnosis not present

## 2017-11-15 DIAGNOSIS — I10 Essential (primary) hypertension: Secondary | ICD-10-CM | POA: Insufficient documentation

## 2017-11-15 DIAGNOSIS — Z85818 Personal history of malignant neoplasm of other sites of lip, oral cavity, and pharynx: Secondary | ICD-10-CM | POA: Diagnosis not present

## 2017-11-15 DIAGNOSIS — Z79899 Other long term (current) drug therapy: Secondary | ICD-10-CM | POA: Insufficient documentation

## 2017-11-15 DIAGNOSIS — F4310 Post-traumatic stress disorder, unspecified: Secondary | ICD-10-CM | POA: Diagnosis not present

## 2017-11-15 DIAGNOSIS — T451X5S Adverse effect of antineoplastic and immunosuppressive drugs, sequela: Secondary | ICD-10-CM | POA: Insufficient documentation

## 2017-11-15 DIAGNOSIS — Z8673 Personal history of transient ischemic attack (TIA), and cerebral infarction without residual deficits: Secondary | ICD-10-CM | POA: Diagnosis not present

## 2017-11-15 DIAGNOSIS — Z85820 Personal history of malignant melanoma of skin: Secondary | ICD-10-CM | POA: Insufficient documentation

## 2017-11-15 DIAGNOSIS — E7800 Pure hypercholesterolemia, unspecified: Secondary | ICD-10-CM | POA: Diagnosis not present

## 2017-11-15 DIAGNOSIS — Z87891 Personal history of nicotine dependence: Secondary | ICD-10-CM | POA: Diagnosis not present

## 2017-11-15 DIAGNOSIS — Z888 Allergy status to other drugs, medicaments and biological substances status: Secondary | ICD-10-CM | POA: Insufficient documentation

## 2017-11-15 DIAGNOSIS — Z7982 Long term (current) use of aspirin: Secondary | ICD-10-CM | POA: Insufficient documentation

## 2017-11-15 DIAGNOSIS — Z955 Presence of coronary angioplasty implant and graft: Secondary | ICD-10-CM | POA: Insufficient documentation

## 2017-11-15 DIAGNOSIS — I480 Paroxysmal atrial fibrillation: Secondary | ICD-10-CM | POA: Diagnosis not present

## 2017-11-15 HISTORY — PX: CARPAL TUNNEL RELEASE: SHX101

## 2017-11-15 HISTORY — DX: Unspecified atrial fibrillation: I48.91

## 2017-11-15 SURGERY — CARPAL TUNNEL RELEASE
Anesthesia: Regional | Site: Wrist | Laterality: Right

## 2017-11-15 MED ORDER — CHLORHEXIDINE GLUCONATE 4 % EX LIQD: 60.0000 mL | Freq: Once | CUTANEOUS | Status: DC

## 2017-11-15 MED ORDER — PROPOFOL 500 MG/50ML IV EMUL
INTRAVENOUS | Status: DC | PRN
  Administered 2017-11-15: 100 ug/kg/min via INTRAVENOUS

## 2017-11-15 MED ORDER — GLYCOPYRROLATE PF 0.2 MG/ML IJ SOSY
PREFILLED_SYRINGE | INTRAMUSCULAR | Status: DC | PRN
  Administered 2017-11-15: .2 mg via INTRAVENOUS

## 2017-11-15 MED ORDER — FENTANYL CITRATE (PF) 100 MCG/2ML IJ SOLN
INTRAMUSCULAR | Status: AC
  Filled 2017-11-15: qty 2

## 2017-11-15 MED ORDER — FENTANYL CITRATE (PF) 100 MCG/2ML IJ SOLN: 50.0000 ug | INTRAMUSCULAR | Status: DC | PRN

## 2017-11-15 MED ORDER — LACTATED RINGERS IV SOLN
INTRAVENOUS | Status: DC
  Administered 2017-11-15 (×2): via INTRAVENOUS

## 2017-11-15 MED ORDER — FENTANYL CITRATE (PF) 100 MCG/2ML IJ SOLN
INTRAMUSCULAR | Status: DC | PRN
  Administered 2017-11-15: 25 ug via INTRAVENOUS

## 2017-11-15 MED ORDER — FENTANYL CITRATE (PF) 100 MCG/2ML IJ SOLN: 25.0000 ug | INTRAMUSCULAR | Status: DC | PRN

## 2017-11-15 MED ORDER — SCOPOLAMINE 1 MG/3DAYS TD PT72: 1.0000 | MEDICATED_PATCH | Freq: Once | TRANSDERMAL | Status: DC | PRN

## 2017-11-15 MED ORDER — MIDAZOLAM HCL 2 MG/2ML IJ SOLN
INTRAMUSCULAR | Status: AC
  Filled 2017-11-15: qty 2

## 2017-11-15 MED ORDER — LIDOCAINE HCL (PF) 0.5 % IJ SOLN
INTRAMUSCULAR | Status: DC | PRN
  Administered 2017-11-15: 50 mL via INTRAVENOUS

## 2017-11-15 MED ORDER — GLYCOPYRROLATE PF 0.2 MG/ML IJ SOSY
PREFILLED_SYRINGE | INTRAMUSCULAR | Status: AC
  Filled 2017-11-15: qty 1

## 2017-11-15 MED ORDER — LACTATED RINGERS IV SOLN: INTRAVENOUS | Status: DC

## 2017-11-15 MED ORDER — MIDAZOLAM HCL 2 MG/2ML IJ SOLN: 1.0000 mg | INTRAMUSCULAR | Status: DC | PRN

## 2017-11-15 MED ORDER — DEXMEDETOMIDINE HCL IN NACL 200 MCG/50ML IV SOLN
INTRAVENOUS | Status: DC | PRN
  Administered 2017-11-15 (×7): 4 ug via INTRAVENOUS

## 2017-11-15 MED ORDER — MIDAZOLAM HCL 5 MG/5ML IJ SOLN
INTRAMUSCULAR | Status: DC | PRN
  Administered 2017-11-15: 2 mg via INTRAVENOUS

## 2017-11-15 MED ORDER — BUPIVACAINE HCL (PF) 0.25 % IJ SOLN
INTRAMUSCULAR | Status: DC | PRN
  Administered 2017-11-15: 20 mL

## 2017-11-15 MED ORDER — CEFAZOLIN SODIUM-DEXTROSE 2-4 GM/100ML-% IV SOLN
2.0000 g | INTRAVENOUS | Status: AC
  Administered 2017-11-15: 2 g via INTRAVENOUS

## 2017-11-15 MED ORDER — HYDROMORPHONE HCL 2 MG PO TABS: 2.0000 mg | ORAL_TABLET | Freq: Three times a day (TID) | ORAL | 0 refills | Status: DC | PRN

## 2017-11-15 MED ORDER — DEXMEDETOMIDINE HCL IN NACL 200 MCG/50ML IV SOLN
INTRAVENOUS | Status: AC
  Filled 2017-11-15: qty 50

## 2017-11-15 MED ORDER — CEFAZOLIN SODIUM-DEXTROSE 2-4 GM/100ML-% IV SOLN
INTRAVENOUS | Status: AC
  Filled 2017-11-15: qty 100

## 2017-11-15 SURGICAL SUPPLY — 50 items
BANDAGE ACE 3X5.8 VEL STRL LF (GAUZE/BANDAGES/DRESSINGS) ×2 IMPLANT
BLADE MINI RND TIP GREEN BEAV (BLADE) ×2 IMPLANT
BLADE SURG 15 STRL LF DISP TIS (BLADE) ×1 IMPLANT
BLADE SURG 15 STRL SS (BLADE) ×2
BNDG CMPR 9X4 STRL LF SNTH (GAUZE/BANDAGES/DRESSINGS) ×1
BNDG ESMARK 4X9 LF (GAUZE/BANDAGES/DRESSINGS) ×2 IMPLANT
BNDG PLASTER X FAST 3X3 WHT LF (CAST SUPPLIES) ×20 IMPLANT
BNDG PLSTR 9X3 FST ST WHT (CAST SUPPLIES) ×10
BRUSH SCRUB EZ PLAIN DRY (MISCELLANEOUS) ×2 IMPLANT
CANISTER SUCT 1200ML W/VALVE (MISCELLANEOUS) ×2 IMPLANT
CORD BIPOLAR FORCEPS 12FT (ELECTRODE) ×2 IMPLANT
COVER BACK TABLE 60X90IN (DRAPES) ×2 IMPLANT
COVER MAYO STAND STRL (DRAPES) ×2 IMPLANT
CUFF TOURNIQUET SINGLE 18IN (TOURNIQUET CUFF) ×1 IMPLANT
DECANTER SPIKE VIAL GLASS SM (MISCELLANEOUS) IMPLANT
DRAPE EXTREMITY T 121X128X90 (DRAPE) ×2 IMPLANT
DRAPE IMP U-DRAPE 54X76 (DRAPES) ×2 IMPLANT
DRAPE SURG 17X23 STRL (DRAPES) ×2 IMPLANT
GAUZE 4X4 16PLY RFD (DISPOSABLE) IMPLANT
GAUZE SPONGE 4X4 12PLY STRL (GAUZE/BANDAGES/DRESSINGS) ×2 IMPLANT
GAUZE XEROFORM 1X8 LF (GAUZE/BANDAGES/DRESSINGS) ×2 IMPLANT
GLOVE BIO SURGEON STRL SZ 6.5 (GLOVE) ×1 IMPLANT
GLOVE BIOGEL PI IND STRL 7.0 (GLOVE) ×1 IMPLANT
GLOVE BIOGEL PI IND STRL 8 (GLOVE) IMPLANT
GLOVE BIOGEL PI INDICATOR 7.0 (GLOVE) ×1
GLOVE BIOGEL PI INDICATOR 8 (GLOVE) ×1
GLOVE ECLIPSE 7.0 STRL STRAW (GLOVE) ×1 IMPLANT
GLOVE SKINSENSE NS SZ7.5 (GLOVE) ×1
GLOVE SKINSENSE STRL SZ7.5 (GLOVE) ×1 IMPLANT
GLOVE SURG SYN 7.5  E (GLOVE) ×1
GLOVE SURG SYN 7.5 E (GLOVE) ×1 IMPLANT
GLOVE SURG SYN 7.5 PF PI (GLOVE) ×1 IMPLANT
GOWN STRL REIN XL XLG (GOWN DISPOSABLE) ×2 IMPLANT
GOWN STRL REUS W/ TWL XL LVL3 (GOWN DISPOSABLE) ×2 IMPLANT
GOWN STRL REUS W/TWL XL LVL3 (GOWN DISPOSABLE)
NDL HYPO 25X1 1.5 SAFETY (NEEDLE) IMPLANT
NEEDLE HYPO 25X1 1.5 SAFETY (NEEDLE) ×2 IMPLANT
NS IRRIG 1000ML POUR BTL (IV SOLUTION) ×2 IMPLANT
PACK BASIN DAY SURGERY FS (CUSTOM PROCEDURE TRAY) ×2 IMPLANT
PAD CAST 3X4 CTTN HI CHSV (CAST SUPPLIES) ×1 IMPLANT
PADDING CAST COTTON 3X4 STRL (CAST SUPPLIES) ×2
RUBBERBAND STERILE (MISCELLANEOUS) ×4 IMPLANT
STOCKINETTE 4X48 STRL (DRAPES) ×2 IMPLANT
SUT ETHILON 3 0 PS 1 (SUTURE) ×2 IMPLANT
SYR BULB 3OZ (MISCELLANEOUS) ×2 IMPLANT
SYR CONTROL 10ML LL (SYRINGE) ×1 IMPLANT
TOWEL GREEN STERILE FF (TOWEL DISPOSABLE) ×2 IMPLANT
TRAY DSU PREP LF (CUSTOM PROCEDURE TRAY) ×2 IMPLANT
TUBE CONNECTING 20X1/4 (TUBING) ×1 IMPLANT
UNDERPAD 30X30 (UNDERPADS AND DIAPERS) ×2 IMPLANT

## 2017-11-15 NOTE — Op Note (Signed)
   Carpal tunnel op note  DATE OF SURGERY:11/15/2017  PREOPERATIVE DIAGNOSIS:  right Carpal tunnel syndrome  POSTOPERATIVE DIAGNOSIS: same  PROCEDURE:  Right carpal tunnel release. CPT [phone]  SURGEON: Surgeon(s): Leandrew Koyanagi, MD  ASSIST: none  ANESTHESIA:  Regional  TOURNIQUET TIME: less than 10 minutes  BLOOD LOSS: Minimal.  COMPLICATIONS: None.  PATHOLOGY: None.  INDICATIONS: The patient is a 57 y.o. -year-old male who presented with carpal tunnel syndrome failing nonsurgical management, indicated for surgical release.  DESCRIPTION OF PROCEDURE: The patient was identified in the preoperative holding area.  The operative site was marked by the surgeon and confirmed by the patient.  He was brought back to the operating room.  Anesthesia was induced by the anesthesia team.  A well padded nonsterile tourniquet was placed. The operative extremity was prepped and draped in standard sterile fashion.  A timeout was performed.  Preoperative antibiotics were given.   A palmar incision was made about 5 mm ulnar to the thenar crease.  The palmar aponeurosis was exposed and divided in line with the skin incision. The palmaris brevis was visualized and divided.  The distal edge of the transcarpal ligament was identified. A hemostat was inserted into the carpal tunnel to protect the median nerve and the flexor tendons. Then, the transverse carpal ligament was released under direct visualization. Proximally, a subcutaneous tunnel was made allowing a Sewell retractor to be placed. Then, the distal portion of the antebrachial fascia was released. Distally, all fibrous bands were released. The median nerve was visualized, and the fat pad was exposed. Following release, local infiltration with 0.25% of Sensorcaine was given. The tourniquet was deflated. Hemostasis achieved.  Wound was irrigated and closed with 4-0 nylon sutures. Sterile dressing applied. The patient was transferred to the recovery room  in stable condition after all counts were correct.  POSTOPERATIVE PLAN: To start nerve gliding exercises as tolerated and no heavy lifting for four weeks.

## 2017-11-15 NOTE — Anesthesia Procedure Notes (Signed)
Anesthesia Regional Block: Bier block (IV Regional)   Pre-Anesthetic Checklist: ,, timeout performed, Correct Patient, Correct Site, Correct Laterality, Correct Procedure, Correct Position, site marked, Risks and benefits discussed,  Surgical consent,  Pre-op evaluation,  At surgeon's request and post-op pain management  Laterality: Right  Prep: alcohol swabs       Needles:  Injection technique: Single-shot      Additional Needles:   Procedures:,,,,, intact distal pulses, Esmarch exsanguination, single tourniquet utilized, #20gu IV placed  Narrative:  Start time: 11/15/2017 2:53 PM End time: 11/15/2017 2:53 PM  Performed by: Personally  CRNA: Genelle Bal, CRNA  Additional Notes: 52mL PF 0.5% lidocaine injected into R hand PIV

## 2017-11-15 NOTE — Discharge Instructions (Signed)
Postoperative instructions:  Weightbearing instructions: no heavy lifting  Dressing instructions: Keep your dressing and/or splint clean and dry at all times.  It will be removed at your first post-operative appointment.  Your stitches and/or staples will be removed at this visit.  Incision instructions:  Do not soak your incision for 3 weeks after surgery.  If the incision gets wet, pat dry and do not scrub the incision.  Pain control:  You have been given a prescription to be taken as directed for post-operative pain control.  In addition, elevate the operative extremity above the heart at all times to prevent swelling and throbbing pain.  Take over-the-counter Colace, 100mg  by mouth twice a day while taking narcotic pain medications to help prevent constipation.  Follow up appointments: 1) 12-14 days for suture removal and wound check. 2) Dr. Erlinda Hong as scheduled.   -------------------------------------------------------------------------------------------------------------  After Surgery Pain Control:  After your surgery, post-surgical discomfort or pain is likely. This discomfort can last several days to a few weeks. At certain times of the day your discomfort may be more intense.  Did you receive a nerve block?  A nerve block can provide pain relief for one hour to two days after your surgery. As long as the nerve block is working, you will experience little or no sensation in the area the surgeon operated on.  As the nerve block wears off, you will begin to experience pain or discomfort. It is very important that you begin taking your prescribed pain medication before the nerve block fully wears off. Treating your pain at the first sign of the block wearing off will ensure your pain is better controlled and more tolerable when full-sensation returns. Do not wait until the pain is intolerable, as the medicine will be less effective. It is better to treat pain in advance than to try and catch  up.  General Anesthesia:  If you did not receive a nerve block during your surgery, you will need to start taking your pain medication shortly after your surgery and should continue to do so as prescribed by your surgeon.  Pain Medication:  Most commonly we prescribe Vicodin and Percocet for post-operative pain. Both of these medications contain a combination of acetaminophen (Tylenol) and a narcotic to help control pain.   It takes between 30 and 45 minutes before pain medication starts to work. It is important to take your medication before your pain level gets too intense.   Nausea is a common side effect of many pain medications. You will want to eat something before taking your pain medicine to help prevent nausea.   If you are taking a prescription pain medication that contains acetaminophen, we recommend that you do not take additional over the counter acetaminophen (Tylenol).  Other pain relieving options:   Using a cold pack to ice the affected area a few times a day (15 to 20 minutes at a time) can help to relieve pain, reduce swelling and bruising.   Elevation of the affected area can also help to reduce pain and swelling.     Post Anesthesia Home Care Instructions  Activity: Get plenty of rest for the remainder of the day. A responsible individual must stay with you for 24 hours following the procedure.  For the next 24 hours, DO NOT: -Drive a car -Paediatric nurse -Drink alcoholic beverages -Take any medication unless instructed by your physician -Make any legal decisions or sign important papers.  Meals: Start with liquid foods such as gelatin  or soup. Progress to regular foods as tolerated. Avoid greasy, spicy, heavy foods. If nausea and/or vomiting occur, drink only clear liquids until the nausea and/or vomiting subsides. Call your physician if vomiting continues.  Special Instructions/Symptoms: Your throat may feel dry or sore from the anesthesia or the  breathing tube placed in your throat during surgery. If this causes discomfort, gargle with warm salt water. The discomfort should disappear within 24 hours.  If you had a scopolamine patch placed behind your ear for the management of post- operative nausea and/or vomiting:  1. The medication in the patch is effective for 72 hours, after which it should be removed.  Wrap patch in a tissue and discard in the trash. Wash hands thoroughly with soap and water. 2. You may remove the patch earlier than 72 hours if you experience unpleasant side effects which may include dry mouth, dizziness or visual disturbances. 3. Avoid touching the patch. Wash your hands with soap and water after contact with the patch.

## 2017-11-15 NOTE — Anesthesia Postprocedure Evaluation (Signed)
Anesthesia Post Note  Patient: Bobby Floyd  Procedure(s) Performed: RIGHT CARPAL TUNNEL RELEASE (Right Wrist)     Patient location during evaluation: PACU Anesthesia Type: Bier Block Level of consciousness: awake and alert Pain management: pain level controlled Vital Signs Assessment: post-procedure vital signs reviewed and stable Respiratory status: spontaneous breathing, nonlabored ventilation and respiratory function stable Cardiovascular status: stable and blood pressure returned to baseline Anesthetic complications: no    Last Vitals:  Vitals:   11/15/17 1604 11/15/17 1620  BP: (!) 154/102 (!) 157/99  Pulse: 60 (!) 59  Resp: 14 16  Temp:  (!) 36.3 C  SpO2: 96% 98%    Last Pain:  Vitals:   11/15/17 1620  TempSrc:   PainSc: 0-No pain                 Audry Pili

## 2017-11-15 NOTE — Anesthesia Preprocedure Evaluation (Addendum)
Anesthesia Evaluation  Patient identified by MRN, date of birth, ID band Patient awake    Reviewed: Allergy & Precautions, NPO status , Patient's Chart, lab work & pertinent test results, reviewed documented beta blocker date and time   History of Anesthesia Complications (+) history of anesthetic complications  Airway Mallampati: II  TM Distance: >3 FB Neck ROM: Full    Dental no notable dental hx. (+) Partial Upper, Partial Lower   Pulmonary neg pulmonary ROS, former smoker,    Pulmonary exam normal breath sounds clear to auscultation       Cardiovascular hypertension, Pt. on home beta blockers and Pt. on medications + angina + CAD and + Past MI  Normal cardiovascular exam Rhythm:Regular Rate:Normal     Neuro/Psych PSYCHIATRIC DISORDERS Anxiety Depression  Neuromuscular disease CVA, No Residual Symptoms negative neurological ROS  negative psych ROS   GI/Hepatic Neg liver ROS, GERD  Medicated and Controlled,  Endo/Other  Hypothyroidism Hyperlipidemia  Renal/GU negative Renal ROS  negative genitourinary   Musculoskeletal negative musculoskeletal ROS (+) Arthritis ,   Abdominal   Peds  Hematology  (+) anemia , Eliquis- last dose 4 days ago   Anesthesia Other Findings   Reproductive/Obstetrics negative OB ROS                            Anesthesia Physical Anesthesia Plan  ASA: III  Anesthesia Plan: Bier Block and Bier Block-LIDOCAINE ONLY   Post-op Pain Management:    Induction: Intravenous  PONV Risk Score and Plan: Treatment may vary due to age or medical condition and Propofol infusion  Airway Management Planned: Simple Face Mask  Additional Equipment: None  Intra-op Plan:   Post-operative Plan:   Informed Consent: I have reviewed the patients History and Physical, chart, labs and discussed the procedure including the risks, benefits and alternatives for the proposed  anesthesia with the patient or authorized representative who has indicated his/her understanding and acceptance.   Dental advisory given  Plan Discussed with: CRNA  Anesthesia Plan Comments: (Pt with recent NSTEMI and balloon angioplasty. Discussed risks of periop MI. Patient expressed understanding.)        Anesthesia Quick Evaluation

## 2017-11-15 NOTE — Transfer of Care (Signed)
Immediate Anesthesia Transfer of Care Note  Patient: Bobby Floyd  Procedure(s) Performed: RIGHT CARPAL TUNNEL RELEASE (Right Wrist)  Patient Location: PACU  Anesthesia Type:MAC and Bier block  Level of Consciousness: drowsy and patient cooperative  Airway & Oxygen Therapy: Patient Spontanous Breathing and Patient connected to face mask oxygen  Post-op Assessment: Report given to RN and Post -op Vital signs reviewed and stable  Post vital signs: Reviewed and stable  Last Vitals:  Vitals Value Taken Time  BP 146/91 11/15/2017  3:21 PM  Temp    Pulse 44 11/15/2017  3:23 PM  Resp 14 11/15/2017  3:23 PM  SpO2 100 % 11/15/2017  3:23 PM  Vitals shown include unvalidated device data.  Last Pain:  Vitals:   11/15/17 1245  TempSrc: Oral  PainSc: 7       Patients Stated Pain Goal: 7 (70/11/00 3496)  Complications: No apparent anesthesia complications

## 2017-11-18 ENCOUNTER — Encounter (HOSPITAL_BASED_OUTPATIENT_CLINIC_OR_DEPARTMENT_OTHER): Payer: Self-pay | Admitting: Orthopaedic Surgery
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
## 2017-11-26 ENCOUNTER — Ambulatory Visit (INDEPENDENT_AMBULATORY_CARE_PROVIDER_SITE_OTHER): Payer: PPO | Admitting: Orthopaedic Surgery

## 2017-11-26 ENCOUNTER — Encounter (INDEPENDENT_AMBULATORY_CARE_PROVIDER_SITE_OTHER): Payer: Self-pay | Admitting: Orthopaedic Surgery

## 2017-11-26 DIAGNOSIS — G5601 Carpal tunnel syndrome, right upper limb: Secondary | ICD-10-CM

## 2017-11-26 DIAGNOSIS — M25521 Pain in right elbow: Secondary | ICD-10-CM | POA: Diagnosis not present

## 2017-11-26 NOTE — Addendum Note (Signed)
Addended by: Minda Ditto, Geoffery Spruce on: 11/26/2017 09:32 AM   Modules accepted: Orders
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
## 2017-12-02 ENCOUNTER — Ambulatory Visit
Admission: RE | Admit: 2017-12-02 | Discharge: 2017-12-02 | Disposition: A | Discharge status: Home / Self Care | Payer: PPO | Source: Ambulatory Visit | Attending: Orthopaedic Surgery | Admitting: Orthopaedic Surgery

## 2017-12-02 DIAGNOSIS — M25521 Pain in right elbow: Secondary | ICD-10-CM

## 2017-12-02 DIAGNOSIS — S56511A Strain of other extensor muscle, fascia and tendon at forearm level, right arm, initial encounter: Secondary | ICD-10-CM | POA: Diagnosis not present

## 2017-12-06 ENCOUNTER — Ambulatory Visit (INDEPENDENT_AMBULATORY_CARE_PROVIDER_SITE_OTHER): Payer: PPO | Admitting: Orthopaedic Surgery

## 2017-12-06 ENCOUNTER — Encounter (INDEPENDENT_AMBULATORY_CARE_PROVIDER_SITE_OTHER): Payer: Self-pay | Admitting: Orthopaedic Surgery

## 2017-12-06 DIAGNOSIS — M25521 Pain in right elbow: Secondary | ICD-10-CM | POA: Diagnosis not present

## 2017-12-06 MED ORDER — DICLOFENAC SODIUM 2 % TD SOLN: TRANSDERMAL | 0 refills | Status: DC
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
## 2017-12-10 ENCOUNTER — Other Ambulatory Visit (INDEPENDENT_AMBULATORY_CARE_PROVIDER_SITE_OTHER): Payer: Self-pay

## 2017-12-10 MED ORDER — DICLOFENAC SODIUM 1 % TD GEL: 2.0000 g | Freq: Four times a day (QID) | TRANSDERMAL | 0 refills | Status: DC

## 2017-12-27 ENCOUNTER — Ambulatory Visit (INDEPENDENT_AMBULATORY_CARE_PROVIDER_SITE_OTHER): Payer: PPO | Admitting: Orthopaedic Surgery

## 2018-01-15 DIAGNOSIS — S62366A Nondisplaced fracture of neck of fifth metacarpal bone, right hand, initial encounter for closed fracture: Secondary | ICD-10-CM | POA: Diagnosis not present

## 2018-01-16 DIAGNOSIS — E890 Postprocedural hypothyroidism: Secondary | ICD-10-CM | POA: Diagnosis not present

## 2018-01-16 DIAGNOSIS — Z85818 Personal history of malignant neoplasm of other sites of lip, oral cavity, and pharynx: Secondary | ICD-10-CM | POA: Diagnosis not present

## 2018-01-16 DIAGNOSIS — L988 Other specified disorders of the skin and subcutaneous tissue: Secondary | ICD-10-CM | POA: Diagnosis not present

## 2018-01-16 DIAGNOSIS — R634 Abnormal weight loss: Secondary | ICD-10-CM | POA: Diagnosis not present

## 2018-01-20 DIAGNOSIS — S62366A Nondisplaced fracture of neck of fifth metacarpal bone, right hand, initial encounter for closed fracture: Secondary | ICD-10-CM | POA: Diagnosis not present

## 2018-01-22 DIAGNOSIS — S62336D Displaced fracture of neck of fifth metacarpal bone, right hand, subsequent encounter for fracture with routine healing: Secondary | ICD-10-CM | POA: Diagnosis not present

## 2018-01-23 ENCOUNTER — Telehealth: Payer: Self-pay | Admitting: Pharmacist

## 2018-01-23 DIAGNOSIS — I2510 Atherosclerotic heart disease of native coronary artery without angina pectoris: Secondary | ICD-10-CM | POA: Diagnosis not present

## 2018-01-23 DIAGNOSIS — E7800 Pure hypercholesterolemia, unspecified: Secondary | ICD-10-CM

## 2018-01-23 DIAGNOSIS — Z6824 Body mass index (BMI) 24.0-24.9, adult: Secondary | ICD-10-CM | POA: Diagnosis not present

## 2018-01-23 DIAGNOSIS — E039 Hypothyroidism, unspecified: Secondary | ICD-10-CM | POA: Diagnosis not present

## 2018-01-23 DIAGNOSIS — I10 Essential (primary) hypertension: Secondary | ICD-10-CM | POA: Diagnosis not present

## 2018-01-23 DIAGNOSIS — J449 Chronic obstructive pulmonary disease, unspecified: Secondary | ICD-10-CM | POA: Diagnosis not present

## 2018-01-23 NOTE — Telephone Encounter (Signed)
Pt called clinic regarding Repatha pt assistance for next year. He will come to clinic on 11/18 for updated fasting lipid panel and will have him fill out Safety Net Application at that time.

## 2018-01-27 ENCOUNTER — Other Ambulatory Visit: Payer: PPO | Admitting: *Deleted

## 2018-01-27 DIAGNOSIS — E7800 Pure hypercholesterolemia, unspecified: Secondary | ICD-10-CM | POA: Diagnosis not present

## 2018-01-27 LAB — LIPID PANEL
CHOL/HDL RATIO: 1.8 ratio (ref 0.0–5.0)
Cholesterol, Total: 120 mg/dL (ref 100–199)
HDL: 65 mg/dL (ref >39)
LDL Calculated: 39 mg/dL (ref 0–99)
Triglycerides: 81 mg/dL (ref 0–149)
VLDL Cholesterol Cal: 16 mg/dL (ref 5–40)

## 2018-01-27 NOTE — Addendum Note (Signed)
Addended by: Cheick Suhr E on: 01/27/2018 08:56 AM   Modules accepted: Orders

## 2018-02-11 DIAGNOSIS — G5620 Lesion of ulnar nerve, unspecified upper limb: Secondary | ICD-10-CM | POA: Diagnosis not present

## 2018-02-13 DIAGNOSIS — C44629 Squamous cell carcinoma of skin of left upper limb, including shoulder: Secondary | ICD-10-CM | POA: Diagnosis not present

## 2018-02-13 DIAGNOSIS — D485 Neoplasm of uncertain behavior of skin: Secondary | ICD-10-CM | POA: Diagnosis not present

## 2018-02-13 DIAGNOSIS — L821 Other seborrheic keratosis: Secondary | ICD-10-CM | POA: Diagnosis not present

## 2018-02-19 DIAGNOSIS — G5603 Carpal tunnel syndrome, bilateral upper limbs: Secondary | ICD-10-CM | POA: Diagnosis not present

## 2018-02-21 DIAGNOSIS — S62336D Displaced fracture of neck of fifth metacarpal bone, right hand, subsequent encounter for fracture with routine healing: Secondary | ICD-10-CM | POA: Diagnosis not present

## 2018-02-24 DIAGNOSIS — J069 Acute upper respiratory infection, unspecified: Secondary | ICD-10-CM | POA: Diagnosis not present

## 2018-02-24 DIAGNOSIS — G629 Polyneuropathy, unspecified: Secondary | ICD-10-CM | POA: Diagnosis not present

## 2018-02-25 DIAGNOSIS — G5620 Lesion of ulnar nerve, unspecified upper limb: Secondary | ICD-10-CM | POA: Diagnosis not present

## 2018-03-18 DIAGNOSIS — J069 Acute upper respiratory infection, unspecified: Secondary | ICD-10-CM | POA: Diagnosis not present

## 2018-03-18 DIAGNOSIS — Z6828 Body mass index (BMI) 28.0-28.9, adult: Secondary | ICD-10-CM | POA: Diagnosis not present

## 2018-03-18 DIAGNOSIS — J449 Chronic obstructive pulmonary disease, unspecified: Secondary | ICD-10-CM | POA: Diagnosis not present

## 2018-03-24 DIAGNOSIS — I252 Old myocardial infarction: Secondary | ICD-10-CM | POA: Diagnosis not present

## 2018-03-24 DIAGNOSIS — Z8673 Personal history of transient ischemic attack (TIA), and cerebral infarction without residual deficits: Secondary | ICD-10-CM | POA: Diagnosis not present

## 2018-03-24 DIAGNOSIS — G5601 Carpal tunnel syndrome, right upper limb: Secondary | ICD-10-CM | POA: Diagnosis not present

## 2018-03-24 DIAGNOSIS — I480 Paroxysmal atrial fibrillation: Secondary | ICD-10-CM | POA: Diagnosis not present

## 2018-03-24 DIAGNOSIS — Z7901 Long term (current) use of anticoagulants: Secondary | ICD-10-CM | POA: Diagnosis not present

## 2018-03-24 DIAGNOSIS — I10 Essential (primary) hypertension: Secondary | ICD-10-CM | POA: Diagnosis not present

## 2018-03-24 DIAGNOSIS — Z7982 Long term (current) use of aspirin: Secondary | ICD-10-CM | POA: Diagnosis not present

## 2018-03-24 DIAGNOSIS — E785 Hyperlipidemia, unspecified: Secondary | ICD-10-CM | POA: Diagnosis not present

## 2018-03-24 DIAGNOSIS — Z85828 Personal history of other malignant neoplasm of skin: Secondary | ICD-10-CM | POA: Diagnosis not present

## 2018-03-24 DIAGNOSIS — G629 Polyneuropathy, unspecified: Secondary | ICD-10-CM | POA: Diagnosis not present

## 2018-03-24 DIAGNOSIS — Z01818 Encounter for other preprocedural examination: Secondary | ICD-10-CM | POA: Diagnosis not present

## 2018-03-24 DIAGNOSIS — J449 Chronic obstructive pulmonary disease, unspecified: Secondary | ICD-10-CM | POA: Diagnosis not present

## 2018-03-24 DIAGNOSIS — G5621 Lesion of ulnar nerve, right upper limb: Secondary | ICD-10-CM | POA: Diagnosis not present

## 2018-03-24 DIAGNOSIS — E039 Hypothyroidism, unspecified: Secondary | ICD-10-CM | POA: Diagnosis not present

## 2018-03-24 DIAGNOSIS — I2510 Atherosclerotic heart disease of native coronary artery without angina pectoris: Secondary | ICD-10-CM | POA: Diagnosis not present

## 2018-03-24 DIAGNOSIS — Z955 Presence of coronary angioplasty implant and graft: Secondary | ICD-10-CM | POA: Diagnosis not present

## 2018-03-24 DIAGNOSIS — Z7951 Long term (current) use of inhaled steroids: Secondary | ICD-10-CM | POA: Diagnosis not present

## 2018-03-24 DIAGNOSIS — Z85819 Personal history of malignant neoplasm of unspecified site of lip, oral cavity, and pharynx: Secondary | ICD-10-CM | POA: Diagnosis not present

## 2018-03-24 DIAGNOSIS — Z79899 Other long term (current) drug therapy: Secondary | ICD-10-CM | POA: Diagnosis not present

## 2018-03-24 DIAGNOSIS — Z87891 Personal history of nicotine dependence: Secondary | ICD-10-CM | POA: Diagnosis not present

## 2018-04-02 ENCOUNTER — Other Ambulatory Visit: Payer: Self-pay | Admitting: *Deleted

## 2018-04-02 NOTE — Patient Outreach (Signed)
[facility]) Care Management  04/02/2018  Bobby Floyd 10-30-1960 [id number]   Transition of Care Referral   Referral Date: 04/01/2018 Referral Source:  HTA IP discharge Date of Admission: unknown Diagnosis: surgery to his right arm and wrist Date of Discharge: on 03/24/18 Facility: [facility] attempt # 1 Patient is able to verify HIPAA- Confirms new address  [address] [place] [address]- Epic updated   Reviewed and addressed Transitional of care referral with patient  Bobby Floyd states he is doing well after his surgery except had a pitcher of water spilled on his right arm cast on 04/01/2018 while eating out with a friend and has scheduled to return to have the cast redone He does discuss in the call concerns with medication costs and managing medicines at home -see below information   Transition of care services noted to be completed by primary care MD office staff-[facility]  Discussed this with Bobby Floyd also  Social: Bobby Floyd is divorced, 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 He is disabled and receives SSI at $1380/month. He reports that by the time he pays rent, medical bills and home bills he has no or little money left on the 5th of each month.  He reports having to pay a co pay prior to his recent surgery but had to do this on a payment plan because he did not have the total amount up front. He reports a loss of his only child a son. He has support of his mother, church, his [place] and friends. He states he likes to joke with everyone He is independent with his care He denies need for transportation assistance to medica appointments He reports going to the food bank to get food   Conditions: Surgery of right arm and wrist, right carpal tunnel syndrome, stage 4 throat/tonsillar cancer survivor, COPD, hypothyroidism, HTN, hx of 8 stent placement, HDL, PTSD, hx of TIA,  first  MI at 24, anemia, CAD, depression, GERD,  Hx of stroke former smoker -quit 12/2010   Medications: He reports cost concerns with Eliquis and Repatha injections 1 q 14 days. He reports he has been taking Repatha for the last 3 years He reports having only 1 injection left at home  He reports Repatha make him dizzy [facility] pharmacy staff member Bobby Floyd is attempting to offer some assistance but he reports he has not heard back from her in "several weeks" Filled out safety net application He reports he has receive one letter of denial Eliquis He reports he is suppose to take 2 Eliquis tabs but reports because of cost ($60) he is only taking 1  A day He reports some help "but little from the government"   Appointments: Saw primary care PA, Bobby, Floyd on 03/18/2018 for issues with sinusitis- Reports a $0 co pay to see primary MD staff  Advance directives: Has a POA, and is not interested in making changes  Consent: [facility] RN CM reviewed [facility] services with patient. Patient gave verbal consent for services for Bobby Floyd telephonic RN [facility]. CM discussed [facility] SW services but he denies need of services at this time related to his finances  Advised patient that other post discharge calls may occur to assess how the patient is doing following the recent hospitalization. Patient voiced understanding and was appreciative of f/u call.    Plan: [facility] RN CM will refer  Bobby Floyd to Bobby Floyd for medication management and any possible assistance with Repatha and Eliquis  Pt encouraged to return a call to [facility] RN CM prn  [facility] RN CM sent a successful outreach letter as discussed with [facility] brochure enclosed for review   Bobby L. Lavina Hamman, RN, BSN, [place] Management Care Coordinator Direct Number [phone] Mobile number [phone]  Main [facility] number [phone] Fax number [phone]

## 2018-04-03 ENCOUNTER — Telehealth: Payer: Self-pay | Admitting: Pharmacist

## 2018-04-03 ENCOUNTER — Other Ambulatory Visit: Payer: Self-pay | Admitting: Pharmacist

## 2018-04-03 MED ORDER — EVOLOCUMAB 140 MG/ML ~~LOC~~ SOAJ: 1.0000 "pen " | SUBCUTANEOUS | 3 refills | Status: DC

## 2018-04-03 MED ORDER — EVOLOCUMAB 140 MG/ML ~~LOC~~ SOAJ: 1.0000 "pen " | SUBCUTANEOUS | 11 refills | Status: DC

## 2018-04-03 NOTE — Telephone Encounter (Signed)
Spoke with pt regarding Repatha - Safety Net Application was denied, 3 month supply at pharmacy is $135 ($180 HTA insurance rate plus 25% discount through [employer]). Pt might be ok affording this, however also provided him with # for Repatha Ready to see if there are any other foundations offering financial support.

## 2018-04-04 ENCOUNTER — Other Ambulatory Visit: Payer: Self-pay | Admitting: Pharmacist

## 2018-04-04 ENCOUNTER — Telehealth: Payer: Self-pay | Admitting: Pharmacist

## 2018-04-04 MED ORDER — EVOLOCUMAB 140 MG/ML ~~LOC~~ SOAJ: 1.0000 "pen " | SUBCUTANEOUS | 11 refills | Status: DC

## 2018-04-04 NOTE — Telephone Encounter (Signed)
Repatha copay unaffordable for pt and [place] has denied patient's application since he qualifies for [place] (only receives 25% assistance and copay is still cost prohibitive). Called pt and provided him with # for [facility] which should be accepting applications for patient assistance for [place]. He will call clinic with update.s

## 2018-04-04 NOTE — Addendum Note (Signed)
Addended by: SUPPLE, MEGAN E on: 04/04/2018 04:22 PM   Modules accepted: Orders

## 2018-04-04 NOTE — Patient Outreach (Signed)
Bobby Floyd [facility]) Care Management  [place]   04/04/2018  Bobby Floyd 1960-09-14 [id number]  Reason for referral: Medication Assistance with Eliquis and Repatha  Referral source: [facility] RN Current insurance:[employer]  PMHx includes but not limited to: hx 8 MIs, hx stroke, coronary artery stenting, paroxysmal atrial fibrillation, hypothyroidism, hypercholesterolemia, dysphagia, anxiety/depression  Outreach:  Successful telephone call with Bobby Floyd.  HIPAA identifiers verified. Patient agreeable to review medications telephonically.  Subjective:  Patient reports 60 tablets of Eliquis costs ~ $50 per month, which he can't afford. He has been filling for 30 pills and taking one daily. Patient reports getting Repatha for free past 3 years, but this year was told he did not qualify and it is now $135 for 3 month supply. Reports his next injection is due 1/25. Patient reports he has been taking trazodone and hydrocodone-APAP for ~10 years and is taking lorazepam every night.   Objective: Lab Results  Component Value Date   CREATININE 1.00 01/13/2017   CREATININE 1.00 01/10/2017   CREATININE 1.05 08/10/2015    Lab Results  Component Value Date   HGBA1C 5.7 (H) 11/30/2014    Lipid Panel     Component Value Date/Time   CHOL 120 01/27/2018 0000   TRIG 81 01/27/2018 0000   HDL 65 01/27/2018 0000   CHOLHDL 1.8 01/27/2018 0000   CHOLHDL 2.7 01/12/2017 0425   VLDL 22 01/12/2017 0425   LDLCALC 39 01/27/2018 0000    BP Readings from Last 3 Encounters:  11/15/17 (!) 157/99  07/30/17 136/70  01/14/17 134/82    Allergies  Allergen Reactions  . Lipitor [Atorvastatin] Swelling and Other (See Comments)    Other reaction(s): Other (See Comments) Urination problems, myalgias also Urination problems, myalgias also  . Other Other (See Comments)    Possible resistance to all narcotic pain meds-dilaudid might work  . Propofol Other (See  Comments)    "violent"  . Benadryl [Diphenhydramine Hcl] Swelling    Hyperactivity, very   . Ondansetron Other (See Comments)    Headaches  . Promethazine Hcl Nausea And Vomiting  . Morphine And Related     Really bad headaches and his bp go up really high    Medications Reviewed Today    Reviewed by Elayne Guerin, [facility] (Pharmacist) on 04/04/18 at 1003  Med List Status: <None>  Medication Order Taking? Sig Documenting Provider Last Dose Status Informant  albuterol (PROAIR HFA) 108 (90 Base) MCG/ACT inhaler [id number] Yes Inhale 2 puffs into the lungs every 6 (six) hours as needed. [provider] Taking Active   apixaban (ELIQUIS) 5 MG TABS tablet [id number] Yes Take 1 tablet (5 mg total) by mouth 2 (two) times daily. Jerline Pain, MD Taking Active            Med Note Luciana Axe, Angelia Mould   Fri Apr 04, 2018  9:48 AM)    aspirin 81 MG tablet [id number] Yes Take 81 mg by mouth daily. [provider] Taking Active   carvedilol (COREG) 6.25 MG tablet [id number] Yes Take 1 tablet (6.25 mg total) by mouth 2 (two) times daily. Jerline Pain, MD Taking Active Self  citalopram (CELEXA) 40 MG tablet [id number] Yes Take 1 tablet by mouth daily. [provider] Taking Active   diclofenac sodium (VOLTAREN) 1 % GEL [id number] No Apply 2 g topically 4 (four) times daily. Leandrew Koyanagi, MD Unknown Active   Evolocumab ([place]) 081 MG/ML Darden Palmer [id number] Yes Inject  1 pen into the skin every 14 (fourteen) days. Larey Dresser, MD Taking Active   HYDROcodone-acetaminophen [facility]) 10-325 MG per tablet [id number] Yes Take 1 tablet by mouth every 6 (six) hours as needed for moderate pain. [provider] Taking Active Self           Med Note Spero Curb, GREG A   Mon Nov 29, 2014  7:01 PM)    levothyroxine (SYNTHROID, LEVOTHROID) 100 MCG tablet [id number] Yes Take 100 mcg by mouth daily before breakfast. [provider] Taking Active Self  LORazepam (ATIVAN) 1 MG tablet  [id number] Yes Take 1.5 mg by mouth daily.  [provider] Taking Active   pantoprazole (PROTONIX) 40 MG tablet [id number] Yes Take 40 mg by mouth 2 (two) times daily.  [provider] Taking Active Self  ramipril (ALTACE) 5 MG capsule [id number] Yes Take 2 capsules (10 mg total) at bedtime by mouth. Modena Jansky, MD Taking Active   traZODone (DESYREL) 100 MG tablet [id number] Yes Take 200 mg by mouth at bedtime. [provider] Taking Active Self          Assessment:  Drugs sorted by system:  Neurologic/Psychologic: lorazepam, citalopram, trazodone   Cardiovascular: apixaban, aspirin 81mg , carvedilol, evolocumab, ramipril  Gastrointestinal: pantoprazole  Pulmonary: albuterol HFA  Endocrine: levothyroxine  Pain: hydrocodone-acetaminophen   Medication Review Findings:   Eliquis: has been taking incorrectly (1 tablet daily instead of 1 tablet twice daily)   On multiple scheduled CNS depressants: lorazepam + hydrocodone-acetaminophen + trazodone    Medication Assistance Findings:  Medication assistance needs identified.   Extra Help:   []  Already receiving Full Extra Help  [x]  Already receiving Partial Extra Help  []  Eligible based on reported income and assets  []  Not Eligible based on reported income and assets   Additional medication assistance options reviewed with patient as warranted:  Foundation programs    Call placed to [employer]. Representative gave [place] contact information for multiple independent foundations that could potentially provide the patient further assistance since he already has LIS.   3-way call with patient placed to Patient Assistance Network. Currently unable to apply for Repatha, but signed up for email to be notified when funds for [place] open again.   Care coordination call to [facility], pharmacist at [facility]. Informed her that he has not been taking his Eliquis correctly. Jinny Blossom has Eliquis samples and  will leave a month supply for him at their office.    Plan: Will close case [facility] pharmacy case as no further needs identified. Will reach back out to the patient if we get a notification of opens funds from the [employer].   Denver Faster PharmD Candidate Class of 2020  [employer] [facility]

## 2018-04-04 NOTE — Telephone Encounter (Signed)
Pt called back to clinic - he has been approved for $2500 patient assistance for Repatha through [employer]. Rx sent to [facility] pharmacy, pt will call with concerns.

## 2018-05-20 DIAGNOSIS — J069 Acute upper respiratory infection, unspecified: Secondary | ICD-10-CM | POA: Diagnosis not present

## 2018-05-20 DIAGNOSIS — Z6828 Body mass index (BMI) 28.0-28.9, adult: Secondary | ICD-10-CM | POA: Diagnosis not present

## 2018-05-20 DIAGNOSIS — J449 Chronic obstructive pulmonary disease, unspecified: Secondary | ICD-10-CM | POA: Diagnosis not present

## 2018-05-20 DIAGNOSIS — R05 Cough: Secondary | ICD-10-CM | POA: Diagnosis not present

## 2018-06-11 ENCOUNTER — Inpatient Hospital Stay (HOSPITAL_COMMUNITY): Payer: PPO | Admitting: Occupational Therapy

## 2018-07-09 DIAGNOSIS — M79671 Pain in right foot: Secondary | ICD-10-CM | POA: Diagnosis not present

## 2018-07-09 DIAGNOSIS — Z6826 Body mass index (BMI) 26.0-26.9, adult: Secondary | ICD-10-CM | POA: Diagnosis not present

## 2018-07-10 ENCOUNTER — Telehealth: Payer: Self-pay | Admitting: Cardiology

## 2018-07-10 NOTE — Telephone Encounter (Signed)
New message     Virtual phone visit scheduled for 06-16-18.  Pt has a flip phone.  Patient gave his consent for phone visit.  YOUR CARDIOLOGY TEAM HAS ARRANGED FOR AN E-VISIT FOR YOUR APPOINTMENT - PLEASE REVIEW IMPORTANT INFORMATION BELOW SEVERAL DAYS PRIOR TO YOUR APPOINTMENT  Due to the recent COVID-19 pandemic, we are transitioning in-person office visits to tele-medicine visits in an effort to decrease unnecessary exposure to our patients, their families, and staff. These visits are billed to your insurance just like a normal visit is. We also encourage you to sign up for MyChart if you have not already done so. You will need a smartphone if possible. For patients that do not have this, we can still complete the visit using a regular telephone but do prefer a smartphone to enable video when possible. You may have a family member that lives with you that can help. If possible, we also ask that you have a blood pressure cuff and scale at home to measure your blood pressure, heart rate and weight prior to your scheduled appointment. Patients with clinical needs that need an in-person evaluation and testing will still be able to come to the office if absolutely necessary. If you have any questions, feel free to call our office.     YOUR PROVIDER WILL BE USING THE FOLLOWING PLATFORM TO COMPLETE YOUR VISIT: Phone   IF USING MYCHART - How to Download the MyChart App to Your SmartPhone   - If Apple, go to [employer] and type in MyChart in the search bar and download the app. If Android, ask patient to go to [employer] and type in [place] in the search bar and download the app. The app is free but as with any other app downloads, your phone may require you to verify saved payment information or Apple/Android password.  - You will need to then log into the app with your MyChart username and password, and select [facility] as your healthcare provider to link the account.  - When it is time for  your visit, go to the MyChart app, find appointments, and click Begin Video Visit. Be sure to Select Allow for your device to access the Microphone and Camera for your visit. You will then be connected, and your provider will be with you shortly.  **If you have any issues connecting or need assistance, please contact MyChart service desk [phone])**  **If using a computer, in order to ensure the best quality for your visit, you will need to use either of the following Internet Browsers: Insurance underwriter or Microsoft Edge**   IF USING DOXIMITY or [email or web address] - The staff will give you instructions on receiving your link to join the meeting the day of your visit.      2-3 DAYS BEFORE YOUR APPOINTMENT  You will receive a telephone call from one of our [place] team members - your caller ID may say "Unknown caller." If this is a video visit, we will walk you through how to get the video launched on your phone. We will remind you check your blood pressure, heart rate and weight prior to your scheduled appointment. If you have an Apple Watch or Kardia, please upload any pertinent ECG strips the day before or morning of your appointment to [place]. Our staff will also make sure you have reviewed the consent and agree to move forward with your scheduled tele-health visit.     THE DAY OF YOUR APPOINTMENT  Approximately  15 minutes prior to your scheduled appointment, you will receive a telephone call from one of [facility] team - your caller ID may say "Unknown caller."  Our staff will confirm medications, vital signs for the day and any symptoms you may be experiencing. Please have this information available prior to the time of visit start. It may also be helpful for you to have a pad of paper and pen handy for any instructions given during your visit. They will also walk you through joining the smartphone meeting if this is a video visit.    CONSENT FOR TELE-HEALTH VISIT - PLEASE  REVIEW  I hereby voluntarily request, consent and authorize [facility] and its employed or contracted physicians, physician assistants, nurse practitioners or other licensed health care professionals (the Practitioner), to provide me with telemedicine health care services (the Services") as deemed necessary by the treating Practitioner. I acknowledge and consent to receive the Services by the Practitioner via telemedicine. I understand that the telemedicine visit will involve communicating with the Practitioner through live audiovisual communication technology and the disclosure of certain medical information by electronic transmission. I acknowledge that I have been given the opportunity to request an in-person assessment or other available alternative prior to the telemedicine visit and am voluntarily participating in the telemedicine visit.  I understand that I have the right to withhold or withdraw my consent to the use of telemedicine in the course of my care at any time, without affecting my right to future care or treatment, and that the Practitioner or I may terminate the telemedicine visit at any time. I understand that I have the right to inspect all information obtained and/or recorded in the course of the telemedicine visit and may receive copies of available information for a reasonable fee.  I understand that some of the potential risks of receiving the Services via telemedicine include:   Delay or interruption in medical evaluation due to technological equipment failure or disruption;  Information transmitted may not be sufficient (e.g. poor resolution of images) to allow for appropriate medical decision making by the Practitioner; and/or   In rare instances, security protocols could fail, causing a breach of personal health information.  Furthermore, I acknowledge that it is my responsibility to provide information about my medical history, conditions and care that is complete and  accurate to the best of my ability. I acknowledge that Practitioner's advice, recommendations, and/or decision may be based on factors not within their control, such as incomplete or inaccurate data provided by me or distortions of diagnostic images or specimens that may result from electronic transmissions. I understand that the practice of medicine is not an exact science and that Practitioner makes no warranties or guarantees regarding treatment outcomes. I acknowledge that I will receive a copy of this consent concurrently upon execution via email to the email address I last provided but may also request a printed copy by calling the office of [place].    I understand that my insurance will be billed for this visit.   I have read or had this consent read to me.  I understand the contents of this consent, which adequately explains the benefits and risks of the Services being provided via telemedicine.   I have been provided ample opportunity to ask questions regarding this consent and the Services and have had my questions answered to my satisfaction.  I give my informed consent for the services to be provided through the use of telemedicine in my medical care  By participating in this telemedicine visit I agree to the above.
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
## 2018-07-16 ENCOUNTER — Encounter: Payer: Self-pay | Admitting: Cardiology

## 2018-07-16 ENCOUNTER — Telehealth: Payer: Self-pay | Admitting: Pharmacist

## 2018-07-16 ENCOUNTER — Other Ambulatory Visit: Payer: Self-pay

## 2018-07-16 ENCOUNTER — Telehealth (INDEPENDENT_AMBULATORY_CARE_PROVIDER_SITE_OTHER): Payer: PPO | Admitting: Cardiology

## 2018-07-16 ENCOUNTER — Telehealth: Payer: Self-pay | Admitting: Cardiology

## 2018-07-16 VITALS — BP 120/82 | HR 96 | Ht 75.0 in | Wt 204.0 lb

## 2018-07-16 DIAGNOSIS — I2510 Atherosclerotic heart disease of native coronary artery without angina pectoris: Secondary | ICD-10-CM | POA: Diagnosis not present

## 2018-07-16 DIAGNOSIS — E7800 Pure hypercholesterolemia, unspecified: Secondary | ICD-10-CM

## 2018-07-16 DIAGNOSIS — Z7901 Long term (current) use of anticoagulants: Secondary | ICD-10-CM

## 2018-07-16 DIAGNOSIS — Z9861 Coronary angioplasty status: Secondary | ICD-10-CM | POA: Diagnosis not present

## 2018-07-16 DIAGNOSIS — I739 Peripheral vascular disease, unspecified: Secondary | ICD-10-CM

## 2018-07-16 DIAGNOSIS — I480 Paroxysmal atrial fibrillation: Secondary | ICD-10-CM

## 2018-07-16 DIAGNOSIS — I779 Disorder of arteries and arterioles, unspecified: Secondary | ICD-10-CM

## 2018-07-16 NOTE — Telephone Encounter (Signed)
Received message from Cecilie Kicks, NP that pt is only taking Eliquis once a day due to cost. [facility] for pt - he was previously receiving medication for free from [employer]. Will follow up with pt - needs to fill out application again.

## 2018-07-16 NOTE — Telephone Encounter (Signed)
New Message ° °Patient returning phone call please call back. °

## 2018-07-16 NOTE — Telephone Encounter (Addendum)
Spoke with patient who reports Eliquis is cost prohibitive at $30/month. Will fill out [employer] Eliquis application to see if pt qualifies for free Eliquis. Pt will need proof of income as well as medication expense report from his pharmacy - advised pt to let me know if he is able to obtain these.

## 2018-07-16 NOTE — Patient Instructions (Addendum)
Medication Instructions:  Your physician has recommended you make the following change in your medication:  1.  WE WILL CALL WITH CHANGES  If you need a refill on your cardiac medications before your next appointment, please call your pharmacy.   Lab work: None ordered  If you have labs (blood work) drawn today and your tests are completely normal, you will receive your results only by: Marland Kitchen MyChart Message (if you have MyChart) OR . A paper copy in the mail If you have any lab test that is abnormal or we need to change your treatment, we will call you to review the results.  Testing/Procedures: None ordered   Follow-Up: At [facility], you and your health needs are our priority.  As part of our continuing mission to provide you with exceptional heart care, we have created designated Provider Care Teams.  These Care Teams include your primary Cardiologist (physician) and Advanced Practice Providers (APPs -  Physician Assistants and Nurse Practitioners) who all work together to provide you with the care you need, when you need it. You will need a follow up appointment in 3 months.  11/11/2018 8:20 am.  Arrive 15 mins prior to your appointment for registration.  This will be an IN OFFICE VISIT UNLESS YOU HEAR OTHERWISE.  Any Other Special Instructions Will Be Listed Below (If Applicable).

## 2018-07-16 NOTE — Telephone Encounter (Signed)
Pt was calling b/c he missed the call for his appt today.  Called pt back and his appt has been started.

## 2018-07-17 DIAGNOSIS — Z85818 Personal history of malignant neoplasm of other sites of lip, oral cavity, and pharynx: Secondary | ICD-10-CM | POA: Diagnosis not present

## 2018-07-17 DIAGNOSIS — D649 Anemia, unspecified: Secondary | ICD-10-CM | POA: Diagnosis not present

## 2018-07-17 DIAGNOSIS — Z0001 Encounter for general adult medical examination with abnormal findings: Secondary | ICD-10-CM | POA: Diagnosis not present

## 2018-07-17 DIAGNOSIS — G8929 Other chronic pain: Secondary | ICD-10-CM | POA: Diagnosis not present

## 2018-07-17 DIAGNOSIS — K5900 Constipation, unspecified: Secondary | ICD-10-CM | POA: Diagnosis not present

## 2018-07-17 DIAGNOSIS — E890 Postprocedural hypothyroidism: Secondary | ICD-10-CM | POA: Diagnosis not present

## 2018-07-17 DIAGNOSIS — C61 Malignant neoplasm of prostate: Secondary | ICD-10-CM | POA: Diagnosis not present

## 2018-07-23 ENCOUNTER — Telehealth (HOSPITAL_COMMUNITY): Payer: Self-pay | Admitting: Licensed Clinical Social Worker

## 2018-07-23 NOTE — Telephone Encounter (Signed)
Clinic received notice that pt application for Eliquis assistance through the [employer] is missing information.  CSW called [facility] to inquire.  They need pt red white and blue Medicare patient identification number.  No recent copy of Medicare card on file so CSW called pt and merged calls so he could provide his current medicare ID number.  CSW confirmed no other missing information at this time- still awaiting determination.  CSW will continue to follow and assist as needed  Jorge Ny, [facility] Desk#: [phone] Cell#: [phone]

## 2018-07-24 ENCOUNTER — Telehealth: Payer: Self-pay | Admitting: Pharmacist

## 2018-07-24 ENCOUNTER — Telehealth: Payer: Self-pay | Admitting: Cardiology

## 2018-07-24 NOTE — Telephone Encounter (Signed)
Samples not picked up , placed at front desk 04/01/18 - returned to refill department 07/24/18

## 2018-07-24 NOTE — Telephone Encounter (Signed)
Called patient to inform that Eliquis assistance request was denied by [employer]. Spoke with [employer] who informed me the reason the request was denied was because the patient did not meet the minimum out-of-pocket spending on medications (3% of annual income). Patient has only spent $174 out of pocket at [place] (needs to spend $211 to qualify).  Call attempt to Bobby Floyd on 07/24/2018 to inform him of this. Patient did not answer, LVM with call back number.

## 2018-07-25 NOTE — Telephone Encounter (Signed)
Spoke with patient today to inform him of denied Eliquis coverage by [employer]. Informed patient that we can resubmit the request once he reaches a minimum of $211 spent out-of-pocket on medications (has spent $174 so far). Patient states that he will be going to the pharmacy tonight to spend $60 on medications. Will call [facility] on Monday to obtain spending records and resubmit the request to [employer] at that time.

## 2018-07-28 NOTE — Telephone Encounter (Signed)
Patient has now met out-of-pocket cost minimum to qualify for [employer] Eliquis assistance. Faxed documents with patient's pharmacy out-of-pocket spending report on 07/28/2018 to [employer].

## 2018-07-29 NOTE — Telephone Encounter (Signed)
Thank you so much

## 2018-07-29 NOTE — Telephone Encounter (Signed)
Spoke with pt who is aware of Eliquis [employer] approval through 03/12/19. Provided him with # to follow up for shipments, and discussed the importance of taking Eliquis twice daily to help prevent strokes and blood clots.

## 2018-07-29 NOTE — Telephone Encounter (Signed)
Called patient on 5/19 to inform him that he has been approved for patient assistance from [employer] for Eliquis. Patient will need to call the Patient Assistance line [phone]) when he needs a refill and Eliquis will be sent to his home. Attempt to call patient was unsuccessful. Left patient a message with instructions to call back.

## 2018-09-14 DIAGNOSIS — A630 Anogenital (venereal) warts: Secondary | ICD-10-CM | POA: Diagnosis not present

## 2018-09-22 DIAGNOSIS — N3000 Acute cystitis without hematuria: Secondary | ICD-10-CM | POA: Diagnosis not present

## 2018-09-22 DIAGNOSIS — A630 Anogenital (venereal) warts: Secondary | ICD-10-CM | POA: Diagnosis not present

## 2018-10-06 DIAGNOSIS — R072 Precordial pain: Secondary | ICD-10-CM | POA: Diagnosis not present

## 2018-10-06 DIAGNOSIS — F69 Unspecified disorder of adult personality and behavior: Secondary | ICD-10-CM | POA: Diagnosis not present

## 2018-10-06 DIAGNOSIS — R079 Chest pain, unspecified: Secondary | ICD-10-CM | POA: Diagnosis not present

## 2018-10-06 DIAGNOSIS — F29 Unspecified psychosis not due to a substance or known physiological condition: Secondary | ICD-10-CM | POA: Diagnosis not present

## 2018-10-06 DIAGNOSIS — R442 Other hallucinations: Secondary | ICD-10-CM | POA: Diagnosis not present

## 2018-10-06 DIAGNOSIS — I959 Hypotension, unspecified: Secondary | ICD-10-CM | POA: Diagnosis not present

## 2018-10-21 DIAGNOSIS — J42 Unspecified chronic bronchitis: Secondary | ICD-10-CM | POA: Diagnosis not present

## 2018-10-21 DIAGNOSIS — I480 Paroxysmal atrial fibrillation: Secondary | ICD-10-CM | POA: Diagnosis not present

## 2018-10-21 DIAGNOSIS — Z6825 Body mass index (BMI) 25.0-25.9, adult: Secondary | ICD-10-CM | POA: Diagnosis not present

## 2018-10-21 DIAGNOSIS — I2510 Atherosclerotic heart disease of native coronary artery without angina pectoris: Secondary | ICD-10-CM | POA: Diagnosis not present

## 2018-11-11 ENCOUNTER — Encounter (INDEPENDENT_AMBULATORY_CARE_PROVIDER_SITE_OTHER): Payer: Self-pay

## 2018-11-11 ENCOUNTER — Ambulatory Visit: Payer: PPO | Admitting: Cardiology

## 2018-11-11 ENCOUNTER — Encounter: Payer: Self-pay | Admitting: Cardiology

## 2018-11-11 ENCOUNTER — Other Ambulatory Visit: Payer: Self-pay

## 2018-11-11 VITALS — BP 128/90 | HR 69 | Ht 75.0 in | Wt 198.0 lb

## 2018-11-11 DIAGNOSIS — E7800 Pure hypercholesterolemia, unspecified: Secondary | ICD-10-CM | POA: Diagnosis not present

## 2018-11-11 DIAGNOSIS — I480 Paroxysmal atrial fibrillation: Secondary | ICD-10-CM

## 2018-11-11 DIAGNOSIS — Z9861 Coronary angioplasty status: Secondary | ICD-10-CM

## 2018-11-11 DIAGNOSIS — I739 Peripheral vascular disease, unspecified: Secondary | ICD-10-CM

## 2018-11-11 DIAGNOSIS — T466X5A Adverse effect of antihyperlipidemic and antiarteriosclerotic drugs, initial encounter: Secondary | ICD-10-CM

## 2018-11-11 DIAGNOSIS — I2510 Atherosclerotic heart disease of native coronary artery without angina pectoris: Secondary | ICD-10-CM

## 2018-11-11 DIAGNOSIS — I779 Disorder of arteries and arterioles, unspecified: Secondary | ICD-10-CM

## 2018-11-11 DIAGNOSIS — Z7901 Long term (current) use of anticoagulants: Secondary | ICD-10-CM

## 2018-11-11 DIAGNOSIS — G720 Drug-induced myopathy: Secondary | ICD-10-CM

## 2018-11-11 NOTE — Patient Instructions (Signed)
Medication Instructions:  The current medical regimen is effective;  continue present plan and medications.  If you need a refill on your cardiac medications before your next appointment, please call your pharmacy.   Follow-Up: At [facility], you and your health needs are our priority.  As part of our continuing mission to provide you with exceptional heart care, we have created designated Provider Care Teams.  These Care Teams include your primary Cardiologist (physician) and Advanced Practice Providers (APPs -  Physician Assistants and Nurse Practitioners) who all work together to provide you with the care you need, when you need it. You will need a follow up appointment in 12 months with Cecilie Kicks, NP and 1 year with Dr Marlou Porch.  Please call our office 2 months in advance to schedule this appointment.  You may see Candee Furbish, MD or one of the following Advanced Practice Providers on your designated Care Team:   Truitt Merle, NP Cecilie Kicks, NP . Kathyrn Drown, NP  Thank you for choosing [facility]!!
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
## 2018-11-25 DIAGNOSIS — J449 Chronic obstructive pulmonary disease, unspecified: Secondary | ICD-10-CM | POA: Diagnosis not present

## 2018-11-25 DIAGNOSIS — Z1389 Encounter for screening for other disorder: Secondary | ICD-10-CM | POA: Diagnosis not present

## 2018-11-25 DIAGNOSIS — Z0000 Encounter for general adult medical examination without abnormal findings: Secondary | ICD-10-CM | POA: Diagnosis not present

## 2018-11-25 DIAGNOSIS — E785 Hyperlipidemia, unspecified: Secondary | ICD-10-CM | POA: Diagnosis not present

## 2018-11-25 DIAGNOSIS — Z1211 Encounter for screening for malignant neoplasm of colon: Secondary | ICD-10-CM | POA: Diagnosis not present

## 2018-11-25 DIAGNOSIS — Z23 Encounter for immunization: Secondary | ICD-10-CM | POA: Diagnosis not present

## 2018-11-25 DIAGNOSIS — E782 Mixed hyperlipidemia: Secondary | ICD-10-CM | POA: Diagnosis not present

## 2018-11-25 DIAGNOSIS — I2510 Atherosclerotic heart disease of native coronary artery without angina pectoris: Secondary | ICD-10-CM | POA: Diagnosis not present

## 2018-11-25 DIAGNOSIS — I480 Paroxysmal atrial fibrillation: Secondary | ICD-10-CM | POA: Diagnosis not present

## 2018-12-19 DIAGNOSIS — T466X5A Adverse effect of antihyperlipidemic and antiarteriosclerotic drugs, initial encounter: Secondary | ICD-10-CM | POA: Insufficient documentation

## 2018-12-19 DIAGNOSIS — G720 Drug-induced myopathy: Secondary | ICD-10-CM | POA: Insufficient documentation

## 2018-12-24 DIAGNOSIS — H353121 Nonexudative age-related macular degeneration, left eye, early dry stage: Secondary | ICD-10-CM | POA: Diagnosis not present

## 2018-12-24 DIAGNOSIS — H43392 Other vitreous opacities, left eye: Secondary | ICD-10-CM | POA: Diagnosis not present

## 2018-12-24 DIAGNOSIS — H25813 Combined forms of age-related cataract, bilateral: Secondary | ICD-10-CM | POA: Diagnosis not present

## 2019-01-06 ENCOUNTER — Telehealth: Payer: Self-pay | Admitting: *Deleted

## 2019-01-06 DIAGNOSIS — Z006 Encounter for examination for normal comparison and control in clinical research program: Secondary | ICD-10-CM

## 2019-01-06 NOTE — Telephone Encounter (Signed)
GOULD EDUInformed Consent  Subject Name:Bobby Floyd  Subject met inclusion and exclusion criteria. The informed consent form, study requirements and expectations were reviewed with the subject and questions and concerns were addressed prior to the signing of the consent form. The subject verbalized understanding of the trial requirements. The subject agreed to participate in the [place] and gave verbal consent to participate in the [place] 01/02/2019. The informed consent was obtained prior to performance of any protocol-specific procedures for the subject. A copy of the signed informed consent was mailedto the subject and a copy was placed in the subject's medical record.  Oletta Cohn.

## 2019-01-20 DIAGNOSIS — E890 Postprocedural hypothyroidism: Secondary | ICD-10-CM | POA: Diagnosis not present

## 2019-01-20 DIAGNOSIS — Z85818 Personal history of malignant neoplasm of other sites of lip, oral cavity, and pharynx: Secondary | ICD-10-CM | POA: Diagnosis not present

## 2019-01-22 ENCOUNTER — Other Ambulatory Visit: Payer: Self-pay | Admitting: *Deleted

## 2019-02-04 ENCOUNTER — Telehealth: Payer: Self-pay | Admitting: Pharmacist

## 2019-02-04 NOTE — Telephone Encounter (Signed)
Received patient assistance form for Eliquis. Called pt and left a message advising him that I have faxed his application form to [employer].

## 2019-02-25 DIAGNOSIS — G629 Polyneuropathy, unspecified: Secondary | ICD-10-CM | POA: Diagnosis not present

## 2019-02-25 DIAGNOSIS — J418 Mixed simple and mucopurulent chronic bronchitis: Secondary | ICD-10-CM | POA: Diagnosis not present

## 2019-02-25 DIAGNOSIS — E039 Hypothyroidism, unspecified: Secondary | ICD-10-CM | POA: Diagnosis not present

## 2019-02-25 DIAGNOSIS — I480 Paroxysmal atrial fibrillation: Secondary | ICD-10-CM | POA: Diagnosis not present

## 2019-03-17 NOTE — Telephone Encounter (Signed)
Called [employer] for update on Eliquis application. They stated application is on hold - pt will need to spend an additional $260.69 out of pocket on medications before he will qualify for pt assistance. Pt is aware and he will ask his pharmacy to print out a copy of his medication expense report once he has hit this amount. He will drop off his papers in the office so we can fax them to [place] for him. They stated they would keep his application open until then.

## 2019-03-20 ENCOUNTER — Other Ambulatory Visit: Payer: Self-pay | Admitting: Cardiology

## 2019-05-01 ENCOUNTER — Telehealth: Payer: Self-pay | Admitting: Pharmacist

## 2019-05-01 NOTE — Telephone Encounter (Signed)
Income verification faxed to [place] on 05/01/19 Fax # [phone]

## 2019-05-18 DIAGNOSIS — D485 Neoplasm of uncertain behavior of skin: Secondary | ICD-10-CM | POA: Diagnosis not present

## 2019-05-18 DIAGNOSIS — C44229 Squamous cell carcinoma of skin of left ear and external auricular canal: Secondary | ICD-10-CM | POA: Diagnosis not present

## 2019-05-26 NOTE — Telephone Encounter (Signed)
[place] called to verify patients diagnosis for Repatha

## 2019-06-15 DIAGNOSIS — C44320 Squamous cell carcinoma of skin of unspecified parts of face: Secondary | ICD-10-CM | POA: Diagnosis not present

## 2019-08-05 DIAGNOSIS — E038 Other specified hypothyroidism: Secondary | ICD-10-CM | POA: Diagnosis not present

## 2019-08-05 DIAGNOSIS — E032 Hypothyroidism due to medicaments and other exogenous substances: Secondary | ICD-10-CM | POA: Diagnosis not present

## 2019-08-05 DIAGNOSIS — Z85818 Personal history of malignant neoplasm of other sites of lip, oral cavity, and pharynx: Secondary | ICD-10-CM | POA: Diagnosis not present

## 2019-08-05 DIAGNOSIS — C099 Malignant neoplasm of tonsil, unspecified: Secondary | ICD-10-CM | POA: Diagnosis not present

## 2019-08-05 DIAGNOSIS — G8929 Other chronic pain: Secondary | ICD-10-CM | POA: Diagnosis not present

## 2019-08-06 DIAGNOSIS — Z85819 Personal history of malignant neoplasm of unspecified site of lip, oral cavity, and pharynx: Secondary | ICD-10-CM | POA: Diagnosis not present

## 2019-08-06 DIAGNOSIS — I10 Essential (primary) hypertension: Secondary | ICD-10-CM | POA: Diagnosis not present

## 2019-08-06 DIAGNOSIS — E039 Hypothyroidism, unspecified: Secondary | ICD-10-CM | POA: Diagnosis not present

## 2019-09-08 ENCOUNTER — Telehealth: Payer: Self-pay | Admitting: Pharmacist

## 2019-09-08 NOTE — Telephone Encounter (Signed)
Pt called stating his Eliquis is expensive because he is in the donut hole. I tried applying for patient assistance for him back in January but he had not spent enough out of pocket to qualify for assistance at that time (needed to spend an additional $260.69 at the time). He states he has now spent enough out of pocket. His pharmacy will fax over proof of medication expenditures, then will fax new amount to [employer] to see if they will approve him for patient assistance.

## 2019-09-08 NOTE — Telephone Encounter (Signed)
Received pharmacy report of out of pocket med expenses for 2021. Updated [employer] Eliquis application has been faxed.

## 2019-09-11 NOTE — Telephone Encounter (Signed)
**Note De-Identified Trachelle Low Obfuscation** Letter received Miyoshi Ligas fax from [place] stating that they have approved the pt for asst with his Eliquis. Approval is good until 12/31.2021. Application Case#: [id number]

## 2019-09-11 NOTE — Telephone Encounter (Signed)
**Note De-Identified  Obfuscation** I have notified the pt of this approval. He verbalized understanding and thanked me for calling to let him know.

## 2019-09-21 ENCOUNTER — Other Ambulatory Visit: Payer: Self-pay | Admitting: Cardiology

## 2019-10-20 DIAGNOSIS — S41112A Laceration without foreign body of left upper arm, initial encounter: Secondary | ICD-10-CM | POA: Diagnosis not present

## 2019-10-23 DIAGNOSIS — S41112D Laceration without foreign body of left upper arm, subsequent encounter: Secondary | ICD-10-CM | POA: Diagnosis not present

## 2019-11-11 ENCOUNTER — Encounter: Payer: Self-pay | Admitting: Cardiology

## 2019-11-11 ENCOUNTER — Telehealth (INDEPENDENT_AMBULATORY_CARE_PROVIDER_SITE_OTHER): Payer: PPO | Admitting: Cardiology

## 2019-11-11 ENCOUNTER — Other Ambulatory Visit: Payer: Self-pay

## 2019-11-11 VITALS — BP 136/80 | HR 86 | Ht 75.0 in | Wt 204.0 lb

## 2019-11-11 DIAGNOSIS — I480 Paroxysmal atrial fibrillation: Secondary | ICD-10-CM | POA: Diagnosis not present

## 2019-11-11 DIAGNOSIS — Z9861 Coronary angioplasty status: Secondary | ICD-10-CM | POA: Diagnosis not present

## 2019-11-11 DIAGNOSIS — G720 Drug-induced myopathy: Secondary | ICD-10-CM | POA: Diagnosis not present

## 2019-11-11 DIAGNOSIS — I2510 Atherosclerotic heart disease of native coronary artery without angina pectoris: Secondary | ICD-10-CM | POA: Diagnosis not present

## 2019-11-11 DIAGNOSIS — T466X5A Adverse effect of antihyperlipidemic and antiarteriosclerotic drugs, initial encounter: Secondary | ICD-10-CM | POA: Diagnosis not present
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
## 2019-11-11 NOTE — Patient Instructions (Signed)
Medication Instructions:  The current medical regimen is effective;  continue present plan and medications.  *If you need a refill on your cardiac medications before your next appointment, please call your pharmacy*  Follow-Up: At [facility], you and your health needs are our priority.  As part of our continuing mission to provide you with exceptional heart care, we have created designated Provider Care Teams.  These Care Teams include your primary Cardiologist (physician) and Advanced Practice Providers (APPs -  Physician Assistants and Nurse Practitioners) who all work together to provide you with the care you need, when you need it.  We recommend signing up for the patient portal called "MyChart".  Sign up information is provided on this After Visit Summary.  MyChart is used to connect with patients for Virtual Visits (Telemedicine).  Patients are able to view lab/test results, encounter notes, upcoming appointments, etc.  Non-urgent messages can be sent to your provider as well.   To learn more about what you can do with MyChart, go to [email or web address].    Your next appointment:   12 month(s)  The format for your next appointment:   In Person  Provider:   Mark Skains, MD   Thank you for choosing [facility]!!      

## 2020-01-11 ENCOUNTER — Other Ambulatory Visit: Payer: Self-pay | Admitting: Oncology

## 2020-01-11 ENCOUNTER — Other Ambulatory Visit: Payer: Self-pay

## 2020-01-11 MED ORDER — OXYCODONE-ACETAMINOPHEN 10-325 MG PO TABS: 1.0000 | ORAL_TABLET | ORAL | 0 refills | Status: DC | PRN

## 2020-01-11 NOTE — Telephone Encounter (Signed)
I'm sorry, I don't have the ability to send narcotics yet, I am not sure if Melissa can send or not. Thanks!

## 2020-01-11 NOTE — Telephone Encounter (Signed)
Patient needs refill on Norco 10/325 to [facility].

## 2020-01-11 NOTE — Telephone Encounter (Signed)
Noted, will forward to Dr Hinton Rao.

## 2020-01-14 ENCOUNTER — Other Ambulatory Visit: Payer: Self-pay | Admitting: Hematology and Oncology

## 2020-01-14 MED ORDER — LORAZEPAM 1 MG PO TABS: 1.5000 mg | ORAL_TABLET | Freq: Every day | ORAL | 0 refills | Status: DC

## 2020-01-18 ENCOUNTER — Telehealth: Payer: Self-pay | Admitting: Pharmacist

## 2020-01-18 NOTE — Telephone Encounter (Signed)
Patient called stating he needs to renew his Eliquis and Repatha patient assistance. I have printed the applications and will leave it at the front dest for patient to fill out. He will come tomorrow. I asked him to bring his SSI statement for 2021, we only have 2020 in our records.

## 2020-01-19 ENCOUNTER — Other Ambulatory Visit: Payer: Self-pay | Admitting: Hematology and Oncology

## 2020-01-19 DIAGNOSIS — E032 Hypothyroidism due to medicaments and other exogenous substances: Secondary | ICD-10-CM

## 2020-01-21 ENCOUNTER — Telehealth: Payer: Self-pay | Admitting: Pharmacist

## 2020-01-21 NOTE — Telephone Encounter (Signed)
Patient called yesterday regarding patient assistance paperwork that was left up front for him.  Is not able to drive from [place] to [place] to pick up, requests it to be mailed.  Sent two envelopes of patient assistance paperwork (Repatha and Eliquis) to patient this morning.
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
## 2020-01-28 ENCOUNTER — Inpatient Hospital Stay: Payer: PPO | Attending: Hematology and Oncology

## 2020-01-28 ENCOUNTER — Inpatient Hospital Stay (INDEPENDENT_AMBULATORY_CARE_PROVIDER_SITE_OTHER): Payer: PPO | Admitting: Hematology and Oncology

## 2020-01-28 ENCOUNTER — Encounter: Payer: Self-pay | Admitting: Hematology and Oncology

## 2020-01-28 ENCOUNTER — Other Ambulatory Visit: Payer: Self-pay | Admitting: Hematology and Oncology

## 2020-01-28 ENCOUNTER — Other Ambulatory Visit: Payer: Self-pay

## 2020-01-28 VITALS — BP 152/91 | HR 82 | Temp 97.8°F | Resp 18 | Ht 73.0 in | Wt 197.1 lb

## 2020-01-28 DIAGNOSIS — Z85818 Personal history of malignant neoplasm of other sites of lip, oral cavity, and pharynx: Secondary | ICD-10-CM | POA: Diagnosis not present

## 2020-01-28 DIAGNOSIS — Z0001 Encounter for general adult medical examination with abnormal findings: Secondary | ICD-10-CM | POA: Diagnosis not present

## 2020-01-28 DIAGNOSIS — E032 Hypothyroidism due to medicaments and other exogenous substances: Secondary | ICD-10-CM | POA: Diagnosis not present

## 2020-01-28 DIAGNOSIS — E038 Other specified hypothyroidism: Secondary | ICD-10-CM | POA: Diagnosis not present

## 2020-01-28 LAB — BASIC METABOLIC PANEL
BUN: 11 (ref 4–21)
CO2: 23 — AB (ref 13–22)
Chloride: 103 (ref 99–108)
Creatinine: 1.1 (ref 0.6–1.3)
Glucose: 94
Potassium: 4.1 (ref 3.4–5.3)
Sodium: 137 (ref 137–147)

## 2020-01-28 LAB — COMPREHENSIVE METABOLIC PANEL
Albumin: 4.2 (ref 3.5–5.0)
Calcium: 9.4 (ref 8.7–10.7)

## 2020-01-28 LAB — CBC AND DIFFERENTIAL
HCT: 47 (ref 41–53)
Hemoglobin: 16 (ref 13.5–17.5)
Neutrophils Absolute: 4.83
Platelets: 153 (ref 150–399)
WBC: 6.9

## 2020-01-28 LAB — HEPATIC FUNCTION PANEL
ALT: 15 (ref 10–40)
AST: 25 (ref 14–40)
Alkaline Phosphatase: 73 (ref 25–125)
Bilirubin, Total: 0.4

## 2020-01-28 LAB — CBC: RBC: 4.88 (ref 3.87–5.11)

## 2020-01-28 LAB — TSH: TSH: 3.481 u[IU]/mL (ref 0.350–4.500)

## 2020-02-03 ENCOUNTER — Telehealth: Payer: Self-pay | Admitting: Pharmacist

## 2020-02-03 NOTE — Telephone Encounter (Signed)
Pt dropped off Repatha and Eliquis patient assistance applications for the 0379 year. These have been faxed to pharmaceutical companies. Pt is aware.

## 2020-02-15 ENCOUNTER — Other Ambulatory Visit: Payer: Self-pay

## 2020-02-15 DIAGNOSIS — F5104 Psychophysiologic insomnia: Secondary | ICD-10-CM

## 2020-02-15 MED ORDER — LORAZEPAM 1 MG PO TABS: 1.5000 mg | ORAL_TABLET | Freq: Every day | ORAL | 5 refills | Status: DC

## 2020-02-19 ENCOUNTER — Other Ambulatory Visit: Payer: Self-pay | Admitting: Hematology and Oncology

## 2020-02-19 DIAGNOSIS — H6121 Impacted cerumen, right ear: Secondary | ICD-10-CM | POA: Diagnosis not present

## 2020-02-19 DIAGNOSIS — F5104 Psychophysiologic insomnia: Secondary | ICD-10-CM

## 2020-02-19 DIAGNOSIS — H9193 Unspecified hearing loss, bilateral: Secondary | ICD-10-CM | POA: Diagnosis not present

## 2020-02-19 MED ORDER — LORAZEPAM 1 MG PO TABS: 1.5000 mg | ORAL_TABLET | Freq: Every day | ORAL | 5 refills | Status: DC

## 2020-02-23 ENCOUNTER — Other Ambulatory Visit: Payer: Self-pay

## 2020-02-23 DIAGNOSIS — Z7901 Long term (current) use of anticoagulants: Secondary | ICD-10-CM | POA: Diagnosis not present

## 2020-02-23 DIAGNOSIS — H9192 Unspecified hearing loss, left ear: Secondary | ICD-10-CM | POA: Diagnosis not present

## 2020-02-23 DIAGNOSIS — M542 Cervicalgia: Secondary | ICD-10-CM

## 2020-02-23 DIAGNOSIS — H6122 Impacted cerumen, left ear: Secondary | ICD-10-CM | POA: Diagnosis not present

## 2020-02-23 DIAGNOSIS — M47812 Spondylosis without myelopathy or radiculopathy, cervical region: Secondary | ICD-10-CM

## 2020-02-23 MED ORDER — HYDROCODONE-ACETAMINOPHEN 10-325 MG PO TABS: 1.0000 | ORAL_TABLET | ORAL | 0 refills | Status: DC | PRN

## 2020-03-15 ENCOUNTER — Telehealth: Payer: Self-pay | Admitting: Pharmacist

## 2020-03-15 MED ORDER — REPATHA SURECLICK 140 MG/ML ~~LOC~~ SOAJ: SUBCUTANEOUS | 3 refills | Status: AC

## 2020-03-15 NOTE — Telephone Encounter (Signed)
Safety Net Application was denied since pt has insurance. However, [employer] re-opened yesterday. Pt has been re approved for pt assistance for Repatha for the next 12 months. Updated info sent to pt's pharmacy. Called pt and left him a message.

## 2020-03-30 NOTE — Telephone Encounter (Signed)
Called for status update on Eliquis pt assistance. They had not processed application yet but stated they will have an update by tomorrow.

## 2020-04-01 NOTE — Telephone Encounter (Signed)
Received call from [employer], Eliquis application put on hold until pt spends $192 more out of pocket on med costs (total he needs to spend is $552). Pt still using Eliquis supply from [facility] from last year since he got a refill in December. He's aware of [employer] grant approval for Repatha and will let us know when he's spent another $192 out of pocket. He'll send [place] his expense report from the pharmacy so that his Eliquis [facility] application can be processed.

## 2020-04-06 ENCOUNTER — Other Ambulatory Visit: Payer: Self-pay

## 2020-04-06 DIAGNOSIS — M47812 Spondylosis without myelopathy or radiculopathy, cervical region: Secondary | ICD-10-CM

## 2020-04-06 DIAGNOSIS — M542 Cervicalgia: Secondary | ICD-10-CM

## 2020-04-06 MED ORDER — HYDROCODONE-ACETAMINOPHEN 10-325 MG PO TABS: 1.0000 | ORAL_TABLET | ORAL | 0 refills | Status: DC | PRN

## 2020-04-06 NOTE — Telephone Encounter (Addendum)
Pt notified that new eRx for Norco sent per [facility] P,NP.   Sent

## 2020-05-05 ENCOUNTER — Other Ambulatory Visit: Payer: Self-pay | Admitting: Oncology

## 2020-05-18 ENCOUNTER — Other Ambulatory Visit: Payer: Self-pay

## 2020-05-18 DIAGNOSIS — M542 Cervicalgia: Secondary | ICD-10-CM

## 2020-05-18 DIAGNOSIS — M47812 Spondylosis without myelopathy or radiculopathy, cervical region: Secondary | ICD-10-CM

## 2020-05-18 MED ORDER — HYDROCODONE-ACETAMINOPHEN 10-325 MG PO TABS: 1.0000 | ORAL_TABLET | ORAL | 0 refills | Status: DC | PRN

## 2020-05-24 DIAGNOSIS — I25118 Atherosclerotic heart disease of native coronary artery with other forms of angina pectoris: Secondary | ICD-10-CM | POA: Diagnosis not present

## 2020-05-24 DIAGNOSIS — K921 Melena: Secondary | ICD-10-CM | POA: Diagnosis not present

## 2020-05-24 DIAGNOSIS — I480 Paroxysmal atrial fibrillation: Secondary | ICD-10-CM | POA: Diagnosis not present

## 2020-05-25 DIAGNOSIS — R195 Other fecal abnormalities: Secondary | ICD-10-CM | POA: Diagnosis not present

## 2020-05-25 DIAGNOSIS — Z20828 Contact with and (suspected) exposure to other viral communicable diseases: Secondary | ICD-10-CM | POA: Diagnosis not present

## 2020-05-25 DIAGNOSIS — K921 Melena: Secondary | ICD-10-CM | POA: Diagnosis not present

## 2020-05-25 DIAGNOSIS — Z1159 Encounter for screening for other viral diseases: Secondary | ICD-10-CM | POA: Diagnosis not present

## 2020-05-25 DIAGNOSIS — D509 Iron deficiency anemia, unspecified: Secondary | ICD-10-CM | POA: Diagnosis not present

## 2020-05-27 DIAGNOSIS — Z86010 Personal history of colonic polyps: Secondary | ICD-10-CM | POA: Diagnosis not present

## 2020-05-27 DIAGNOSIS — K921 Melena: Secondary | ICD-10-CM | POA: Diagnosis not present

## 2020-05-27 DIAGNOSIS — K222 Esophageal obstruction: Secondary | ICD-10-CM | POA: Diagnosis not present

## 2020-05-27 DIAGNOSIS — D509 Iron deficiency anemia, unspecified: Secondary | ICD-10-CM | POA: Diagnosis not present

## 2020-05-27 DIAGNOSIS — K3189 Other diseases of stomach and duodenum: Secondary | ICD-10-CM | POA: Diagnosis not present

## 2020-05-27 DIAGNOSIS — R1310 Dysphagia, unspecified: Secondary | ICD-10-CM | POA: Diagnosis not present

## 2020-05-27 DIAGNOSIS — K219 Gastro-esophageal reflux disease without esophagitis: Secondary | ICD-10-CM | POA: Diagnosis not present

## 2020-05-27 DIAGNOSIS — K648 Other hemorrhoids: Secondary | ICD-10-CM | POA: Diagnosis not present

## 2020-06-16 DIAGNOSIS — I110 Hypertensive heart disease with heart failure: Secondary | ICD-10-CM | POA: Diagnosis not present

## 2020-06-16 DIAGNOSIS — R410 Disorientation, unspecified: Secondary | ICD-10-CM | POA: Diagnosis not present

## 2020-06-16 DIAGNOSIS — R569 Unspecified convulsions: Secondary | ICD-10-CM | POA: Diagnosis not present

## 2020-06-16 DIAGNOSIS — Z041 Encounter for examination and observation following transport accident: Secondary | ICD-10-CM | POA: Diagnosis not present

## 2020-06-16 DIAGNOSIS — Z8589 Personal history of malignant neoplasm of other organs and systems: Secondary | ICD-10-CM | POA: Diagnosis not present

## 2020-06-16 DIAGNOSIS — I619 Nontraumatic intracerebral hemorrhage, unspecified: Secondary | ICD-10-CM | POA: Diagnosis not present

## 2020-06-16 DIAGNOSIS — K148 Other diseases of tongue: Secondary | ICD-10-CM | POA: Diagnosis not present

## 2020-06-16 DIAGNOSIS — C3490 Malignant neoplasm of unspecified part of unspecified bronchus or lung: Secondary | ICD-10-CM | POA: Diagnosis not present

## 2020-06-16 DIAGNOSIS — R531 Weakness: Secondary | ICD-10-CM | POA: Diagnosis not present

## 2020-06-16 DIAGNOSIS — Z87891 Personal history of nicotine dependence: Secondary | ICD-10-CM | POA: Diagnosis not present

## 2020-06-16 DIAGNOSIS — C7931 Secondary malignant neoplasm of brain: Secondary | ICD-10-CM | POA: Diagnosis not present

## 2020-06-16 DIAGNOSIS — R918 Other nonspecific abnormal finding of lung field: Secondary | ICD-10-CM | POA: Insufficient documentation

## 2020-06-16 DIAGNOSIS — R4182 Altered mental status, unspecified: Secondary | ICD-10-CM | POA: Diagnosis not present

## 2020-06-16 DIAGNOSIS — E890 Postprocedural hypothyroidism: Secondary | ICD-10-CM | POA: Diagnosis not present

## 2020-06-16 DIAGNOSIS — R911 Solitary pulmonary nodule: Secondary | ICD-10-CM | POA: Diagnosis not present

## 2020-06-16 DIAGNOSIS — K219 Gastro-esophageal reflux disease without esophagitis: Secondary | ICD-10-CM | POA: Diagnosis not present

## 2020-06-16 DIAGNOSIS — G939 Disorder of brain, unspecified: Secondary | ICD-10-CM | POA: Insufficient documentation

## 2020-06-16 DIAGNOSIS — I480 Paroxysmal atrial fibrillation: Secondary | ICD-10-CM | POA: Diagnosis not present

## 2020-06-16 DIAGNOSIS — R4781 Slurred speech: Secondary | ICD-10-CM | POA: Diagnosis not present

## 2020-06-16 DIAGNOSIS — Z85828 Personal history of other malignant neoplasm of skin: Secondary | ICD-10-CM | POA: Diagnosis not present

## 2020-06-16 DIAGNOSIS — G8929 Other chronic pain: Secondary | ICD-10-CM | POA: Diagnosis not present

## 2020-06-16 DIAGNOSIS — S06360A Traumatic hemorrhage of cerebrum, unspecified, without loss of consciousness, initial encounter: Secondary | ICD-10-CM | POA: Diagnosis not present

## 2020-06-16 DIAGNOSIS — Z923 Personal history of irradiation: Secondary | ICD-10-CM | POA: Diagnosis not present

## 2020-06-16 DIAGNOSIS — C44320 Squamous cell carcinoma of skin of unspecified parts of face: Secondary | ICD-10-CM | POA: Diagnosis not present

## 2020-06-16 DIAGNOSIS — Z955 Presence of coronary angioplasty implant and graft: Secondary | ICD-10-CM | POA: Diagnosis not present

## 2020-06-16 DIAGNOSIS — F419 Anxiety disorder, unspecified: Secondary | ICD-10-CM | POA: Diagnosis not present

## 2020-06-16 DIAGNOSIS — Z8673 Personal history of transient ischemic attack (TIA), and cerebral infarction without residual deficits: Secondary | ICD-10-CM | POA: Diagnosis not present

## 2020-06-16 DIAGNOSIS — I509 Heart failure, unspecified: Secondary | ICD-10-CM | POA: Diagnosis not present

## 2020-06-16 DIAGNOSIS — J449 Chronic obstructive pulmonary disease, unspecified: Secondary | ICD-10-CM | POA: Diagnosis not present

## 2020-06-16 DIAGNOSIS — R61 Generalized hyperhidrosis: Secondary | ICD-10-CM | POA: Diagnosis not present

## 2020-06-16 DIAGNOSIS — H539 Unspecified visual disturbance: Secondary | ICD-10-CM | POA: Diagnosis not present

## 2020-06-16 DIAGNOSIS — I6503 Occlusion and stenosis of bilateral vertebral arteries: Secondary | ICD-10-CM | POA: Diagnosis not present

## 2020-06-16 DIAGNOSIS — I2510 Atherosclerotic heart disease of native coronary artery without angina pectoris: Secondary | ICD-10-CM | POA: Diagnosis not present

## 2020-06-16 DIAGNOSIS — F32A Depression, unspecified: Secondary | ICD-10-CM | POA: Diagnosis not present

## 2020-06-16 DIAGNOSIS — Z9221 Personal history of antineoplastic chemotherapy: Secondary | ICD-10-CM | POA: Diagnosis not present

## 2020-06-16 DIAGNOSIS — R4020 Unspecified coma: Secondary | ICD-10-CM | POA: Diagnosis not present

## 2020-06-16 DIAGNOSIS — Z7901 Long term (current) use of anticoagulants: Secondary | ICD-10-CM | POA: Diagnosis not present

## 2020-06-16 DIAGNOSIS — G9349 Other encephalopathy: Secondary | ICD-10-CM | POA: Diagnosis not present

## 2020-06-16 DIAGNOSIS — R404 Transient alteration of awareness: Secondary | ICD-10-CM | POA: Diagnosis not present

## 2020-06-16 DIAGNOSIS — G4700 Insomnia, unspecified: Secondary | ICD-10-CM | POA: Diagnosis not present

## 2020-06-16 LAB — CBC AND DIFFERENTIAL
HCT: 35 — AB (ref 41–53)
Hemoglobin: 11.5 — AB (ref 13.5–17.5)
Platelets: 227 (ref 150–399)
WBC: 7.1

## 2020-06-16 LAB — BASIC METABOLIC PANEL
BUN: 14 (ref 4–21)
CO2: 21 (ref 13–22)
Chloride: 110 — AB (ref 99–108)
Creatinine: 1 (ref 0.6–1.3)
Glucose: 89
Potassium: 4.1 (ref 3.4–5.3)
Sodium: 134 — AB (ref 137–147)

## 2020-06-16 LAB — HEPATIC FUNCTION PANEL
ALT: 14 (ref 10–40)
AST: 34 (ref 14–40)
Alkaline Phosphatase: 70 (ref 25–125)
Bilirubin, Total: 0.7

## 2020-06-16 LAB — COMPREHENSIVE METABOLIC PANEL
Albumin: 3.5 (ref 3.5–5.0)
Calcium: 8.9 (ref 8.7–10.7)

## 2020-06-16 LAB — CBC: RBC: 3.9 (ref 3.87–5.11)

## 2020-06-20 ENCOUNTER — Telehealth: Payer: Self-pay
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
## 2020-06-21 ENCOUNTER — Telehealth: Payer: Self-pay | Admitting: Hematology and Oncology

## 2020-06-21 ENCOUNTER — Inpatient Hospital Stay: Attending: Hematology and Oncology | Admitting: Hematology and Oncology

## 2020-06-21 ENCOUNTER — Encounter: Payer: Self-pay | Admitting: Hematology and Oncology

## 2020-06-21 VITALS — BP 135/95 | HR 94 | Temp 97.2°F | Resp 20 | Ht 73.0 in | Wt 193.7 lb

## 2020-06-21 DIAGNOSIS — C099 Malignant neoplasm of tonsil, unspecified: Secondary | ICD-10-CM | POA: Diagnosis not present

## 2020-06-21 LAB — CBC: MCV: 91 (ref 80–94)

## 2020-06-21 MED ORDER — OXYCODONE HCL 10 MG PO TABS: 10.0000 mg | ORAL_TABLET | Freq: Four times a day (QID) | ORAL | 0 refills | Status: DC | PRN

## 2020-06-21 NOTE — Telephone Encounter (Signed)
Pt coming in today to see Kelli,PA. He did get his Keppra yesterday.
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
## 2020-06-21 NOTE — Telephone Encounter (Signed)
Per 4/12 LOS, patient scheduled for 4/26 Follow Up.  Gave patient Appt Summary

## 2020-06-22 ENCOUNTER — Telehealth: Payer: Self-pay | Admitting: Pharmacist

## 2020-06-22 ENCOUNTER — Telehealth: Payer: Self-pay

## 2020-06-22 NOTE — Telephone Encounter (Signed)
Patient called Bobby Floyd clinic to let us know that he was diagnosed with a brain tumor on Thursday. Hospice is getting involved. States his tumor is bleeding a little bit, but they still have him on his Eliquis and he is still taking it. I let patient know that we would be praying for him and that I would pass this information along to Dr. Marlou Porch.

## 2020-06-22 NOTE — Telephone Encounter (Addendum)
I spoke with Bobby Floyd, & notified her of Bobby Floyd's preference below. She verbalized understanding.   ----- Message from Marvia Pickles, PA-C sent at 06/22/2020  8:50 AM EDT ----- Regarding: RE: Hospice attending Contact: [phone] I appreciate the offer, but I think the patient would prefer I continue for right now. ----- Message ----- From: Dairl Ponder, RN Sent: 06/21/2020   4:14 PM EDT To: Marvia Pickles, PA-C Subject: Hospice attending                              Received call from [facility], [facility] liaison. She wanted to verify if you would like to stay attending or prefer that Hospice physician do so?

## 2020-06-27 ENCOUNTER — Telehealth: Payer: Self-pay

## 2020-06-27 DIAGNOSIS — G4700 Insomnia, unspecified: Secondary | ICD-10-CM

## 2020-06-27 DIAGNOSIS — Z515 Encounter for palliative care: Secondary | ICD-10-CM

## 2020-06-27 NOTE — Telephone Encounter (Signed)
I spoke with Bobby Floyd. I gave her Kelli.,PA, orders listed below. Orders read back and verified.   Per Kelli,PA: Let's try increasing the trazodone to 150mg  hs. Would decrease Ativan to 1 tab, might be having opposite effect. Thanks!     Bobby Floyd: Received a call from [place], [place]. Pt is having persistent insomnia even w/  use of Ativan 1.5 tabs Q HS & trazodone. States he cant even tell the ativan does anything. "He has restless legs,&  his mind just wont settle down. I don't know what y'all would like to do, but he needs some rest".

## 2020-06-30 ENCOUNTER — Telehealth: Payer: Self-pay | Admitting: Hematology and Oncology

## 2020-06-30 NOTE — Telephone Encounter (Signed)
Call from hospice nurse reporting the same symptoms as earlier this week with difficulty sleeping, mind racing and restless legs.  Unfortunately, the orders from earlier this week apparently were not communicated with the patient.  I recommended he decrease his Ativan to 1 mg at bedtime and increase the trazodone to 150 mg at bedtime.  He is currently taking trazodone 100 mg twice daily.

## 2020-07-05 ENCOUNTER — Encounter: Payer: Self-pay | Admitting: Hematology and Oncology

## 2020-07-05 ENCOUNTER — Telehealth: Payer: Self-pay

## 2020-07-05 ENCOUNTER — Telehealth: Payer: Self-pay | Admitting: Hematology and Oncology

## 2020-07-05 ENCOUNTER — Other Ambulatory Visit: Payer: Self-pay

## 2020-07-05 ENCOUNTER — Inpatient Hospital Stay (INDEPENDENT_AMBULATORY_CARE_PROVIDER_SITE_OTHER): Admitting: Hematology and Oncology

## 2020-07-05 DIAGNOSIS — R918 Other nonspecific abnormal finding of lung field: Secondary | ICD-10-CM | POA: Diagnosis not present

## 2020-07-05 DIAGNOSIS — G939 Disorder of brain, unspecified: Secondary | ICD-10-CM | POA: Diagnosis not present

## 2020-07-05 MED ORDER — OXYCODONE HCL ER 20 MG PO T12A: 20.0000 mg | EXTENDED_RELEASE_TABLET | Freq: Two times a day (BID) | ORAL | 0 refills | Status: DC

## 2020-07-05 MED ORDER — BENZONATATE 100 MG PO CAPS: 100.0000 mg | ORAL_CAPSULE | Freq: Three times a day (TID) | ORAL | 2 refills | Status: AC | PRN

## 2020-07-05 MED ORDER — ROPINIROLE HCL 0.25 MG PO TABS: 0.2500 mg | ORAL_TABLET | Freq: Every day | ORAL | 2 refills | Status: AC

## 2020-07-05 NOTE — Telephone Encounter (Signed)
Per 4/26 LOS, patient scheduled for May Appt's.  Gave patient Appt Summary

## 2020-07-05 NOTE — Telephone Encounter (Signed)
Notified Lan[place], RN case Freight forwarder for [place] regarding medication changes.   Changes as follow:  *Trazadone 100mg  take 2 at bedtime and stop morning dose *Ativan 1mg , take 1 in the morning and at lunch, 1 1/2 at bedtime *Oxycodone ER 20mg  (long acting) take 1 every 12 hours *Oxycodone 10mg  (short acting) take 1 every 4-6 hours as needed for pain *Roperinole 0.25mg  at bedtime for restless legs *Benzonatate 100mg  (cough) take 1 every 8 hours as needed for cough
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
## 2020-07-11 ENCOUNTER — Other Ambulatory Visit: Payer: Self-pay

## 2020-07-11 ENCOUNTER — Telehealth: Payer: Self-pay

## 2020-07-11 DIAGNOSIS — C099 Malignant neoplasm of tonsil, unspecified: Secondary | ICD-10-CM

## 2020-07-11 DIAGNOSIS — G939 Disorder of brain, unspecified: Secondary | ICD-10-CM

## 2020-07-11 DIAGNOSIS — Z515 Encounter for palliative care: Secondary | ICD-10-CM

## 2020-07-11 MED ORDER — ARIPIPRAZOLE 2 MG PO TABS: 2.0000 mg | ORAL_TABLET | Freq: Every day | ORAL | 0 refills | Status: AC

## 2020-07-11 MED ORDER — LORAZEPAM 1 MG PO TABS: 1.0000 mg | ORAL_TABLET | ORAL | 0 refills | Status: AC

## 2020-07-11 MED ORDER — ELIQUIS 5 MG PO TABS
5.0000 mg | ORAL_TABLET | Freq: Every day | ORAL | 0 refills | Status: AC
Start: 2020-07-11 — End: ?

## 2020-07-11 MED ORDER — PANTOPRAZOLE SODIUM 40 MG PO TBEC: 1.0000 | DELAYED_RELEASE_TABLET | Freq: Two times a day (BID) | ORAL | 5 refills | Status: AC

## 2020-07-11 MED ORDER — OXYCODONE HCL ER 20 MG PO T12A: 20.0000 mg | EXTENDED_RELEASE_TABLET | Freq: Two times a day (BID) | ORAL | 0 refills | Status: DC

## 2020-07-11 NOTE — Telephone Encounter (Signed)
Leanette from Hospice called stating patient was needing refills on medications. Also patient reports having dark stools and emesis. Leanette states she has not seen any of this yet. Medication refills: Oxycodone 20mg  every 12 hours, Protonix 40mg  BID, Ativan 1mg  (1 tablet in morning, 1 tablet at lunch and 1.5 tablets at bedtime), Abilify 2mg  daily and Eliqius 5mg  daily. Sent Rosanne Sack PA-C the refill request.

## 2020-07-11 NOTE — Addendum Note (Signed)
Addended byGeorgette Shell on: 07/11/2020 03:06 PM   Modules accepted: Orders

## 2020-07-19 ENCOUNTER — Other Ambulatory Visit: Payer: Self-pay | Admitting: Hematology and Oncology

## 2020-07-19 ENCOUNTER — Other Ambulatory Visit: Payer: Self-pay

## 2020-07-19 DIAGNOSIS — C099 Malignant neoplasm of tonsil, unspecified: Secondary | ICD-10-CM

## 2020-07-19 DIAGNOSIS — G939 Disorder of brain, unspecified: Secondary | ICD-10-CM

## 2020-07-19 DIAGNOSIS — Z515 Encounter for palliative care: Secondary | ICD-10-CM

## 2020-07-19 MED ORDER — OXYCODONE HCL 10 MG PO TABS: 10.0000 mg | ORAL_TABLET | ORAL | 0 refills | Status: AC | PRN

## 2020-07-19 MED ORDER — TRAZODONE HCL 100 MG PO TABS: 200.0000 mg | ORAL_TABLET | Freq: Every day | ORAL | 2 refills | Status: AC

## 2020-07-19 NOTE — Telephone Encounter (Signed)
Colletta [place] called to let us know that patient needs a refill on his Oxycodone 10mg  (1 pill left) and Trazadone 100mg . Refill request sent to Rosanne Sack, PA-C

## 2020-07-26 ENCOUNTER — Other Ambulatory Visit: Payer: Self-pay

## 2020-07-26 DIAGNOSIS — C099 Malignant neoplasm of tonsil, unspecified: Secondary | ICD-10-CM

## 2020-07-26 DIAGNOSIS — Z515 Encounter for palliative care: Secondary | ICD-10-CM

## 2020-07-26 DIAGNOSIS — G939 Disorder of brain, unspecified: Secondary | ICD-10-CM

## 2020-07-26 MED ORDER — OXYCODONE HCL ER 20 MG PO T12A: 20.0000 mg | EXTENDED_RELEASE_TABLET | Freq: Two times a day (BID) | ORAL | 0 refills | Status: AC

## 2020-07-27 ENCOUNTER — Inpatient Hospital Stay

## 2020-07-27 ENCOUNTER — Inpatient Hospital Stay: Admitting: Hematology and Oncology

## 2020-07-27 ENCOUNTER — Ambulatory Visit: Payer: PPO | Admitting: Oncology

## 2020-08-02 ENCOUNTER — Inpatient Hospital Stay: Attending: Hematology and Oncology | Admitting: Hematology and Oncology

## 2020-08-02 ENCOUNTER — Encounter: Payer: Self-pay | Admitting: Hematology and Oncology

## 2020-08-02 ENCOUNTER — Other Ambulatory Visit: Payer: Self-pay

## 2020-08-02 ENCOUNTER — Other Ambulatory Visit: Payer: Self-pay | Admitting: Pharmacist

## 2020-08-02 ENCOUNTER — Inpatient Hospital Stay

## 2020-08-02 VITALS — BP 144/93 | HR 74 | Temp 97.8°F | Resp 18 | Wt 191.1 lb

## 2020-08-02 DIAGNOSIS — E860 Dehydration: Secondary | ICD-10-CM | POA: Diagnosis not present

## 2020-08-02 DIAGNOSIS — E039 Hypothyroidism, unspecified: Secondary | ICD-10-CM | POA: Diagnosis not present

## 2020-08-02 DIAGNOSIS — G8929 Other chronic pain: Secondary | ICD-10-CM | POA: Insufficient documentation

## 2020-08-02 DIAGNOSIS — K5900 Constipation, unspecified: Secondary | ICD-10-CM | POA: Insufficient documentation

## 2020-08-02 DIAGNOSIS — C7931 Secondary malignant neoplasm of brain: Secondary | ICD-10-CM | POA: Diagnosis not present

## 2020-08-02 DIAGNOSIS — G939 Disorder of brain, unspecified: Secondary | ICD-10-CM

## 2020-08-02 DIAGNOSIS — G2581 Restless legs syndrome: Secondary | ICD-10-CM | POA: Diagnosis not present

## 2020-08-02 DIAGNOSIS — Z9221 Personal history of antineoplastic chemotherapy: Secondary | ICD-10-CM | POA: Diagnosis not present

## 2020-08-02 DIAGNOSIS — Z79899 Other long term (current) drug therapy: Secondary | ICD-10-CM | POA: Insufficient documentation

## 2020-08-02 DIAGNOSIS — I252 Old myocardial infarction: Secondary | ICD-10-CM | POA: Insufficient documentation

## 2020-08-02 DIAGNOSIS — Z79891 Long term (current) use of opiate analgesic: Secondary | ICD-10-CM | POA: Insufficient documentation

## 2020-08-02 DIAGNOSIS — R2681 Unsteadiness on feet: Secondary | ICD-10-CM | POA: Diagnosis not present

## 2020-08-02 DIAGNOSIS — Z87891 Personal history of nicotine dependence: Secondary | ICD-10-CM | POA: Insufficient documentation

## 2020-08-02 DIAGNOSIS — R112 Nausea with vomiting, unspecified: Secondary | ICD-10-CM

## 2020-08-02 DIAGNOSIS — Z85818 Personal history of malignant neoplasm of other sites of lip, oral cavity, and pharynx: Secondary | ICD-10-CM | POA: Insufficient documentation

## 2020-08-02 DIAGNOSIS — R918 Other nonspecific abnormal finding of lung field: Secondary | ICD-10-CM | POA: Insufficient documentation

## 2020-08-02 DIAGNOSIS — G4700 Insomnia, unspecified: Secondary | ICD-10-CM | POA: Insufficient documentation

## 2020-08-02 DIAGNOSIS — Z923 Personal history of irradiation: Secondary | ICD-10-CM | POA: Insufficient documentation

## 2020-08-02 DIAGNOSIS — Z7901 Long term (current) use of anticoagulants: Secondary | ICD-10-CM | POA: Insufficient documentation

## 2020-08-02 DIAGNOSIS — I4891 Unspecified atrial fibrillation: Secondary | ICD-10-CM | POA: Diagnosis not present

## 2020-08-02 DIAGNOSIS — F419 Anxiety disorder, unspecified: Secondary | ICD-10-CM | POA: Insufficient documentation

## 2020-08-02 MED ORDER — SODIUM CHLORIDE 0.9 % IV SOLN
Freq: Once | INTRAVENOUS | Status: AC
  Filled 2020-08-02: qty 250

## 2020-08-02 MED ORDER — DEXAMETHASONE SODIUM PHOSPHATE 10 MG/ML IJ SOLN
INTRAMUSCULAR | Status: AC
  Filled 2020-08-02: qty 1

## 2020-08-02 MED ORDER — DEXAMETHASONE SODIUM PHOSPHATE 10 MG/ML IJ SOLN
10.0000 mg | Freq: Once | INTRAMUSCULAR | Status: AC
  Administered 2020-08-02: 10 mg via INTRAVENOUS

## 2020-08-02 MED ORDER — DEXAMETHASONE 4 MG PO TABS: 4.0000 mg | ORAL_TABLET | Freq: Two times a day (BID) | ORAL | 1 refills | Status: AC

## 2020-08-02 NOTE — Patient Instructions (Signed)
Dehydration, Adult Dehydration is condition in which there is not enough water or other fluids in the body. This happens when a person loses more fluids than he or she takes in. Important body parts cannot work right without the right amount of fluids. Any loss of fluids from the body can cause dehydration. Dehydration can be mild, worse, or very bad. It should be treated right away to keep it from getting very bad. What are the causes? This condition may be caused by:  Conditions that cause loss of water or other fluids, such as: ? Watery poop (diarrhea). ? Vomiting. ? Sweating a lot. ? Peeing (urinating) a lot.  Not drinking enough fluids, especially when you: ? Are ill. ? Are doing things that take a lot of energy to do.  Other illnesses and conditions, such as fever or infection.  Certain medicines, such as medicines that take extra fluid out of the body (diuretics).  Lack of safe drinking water.  Not being able to get enough water and food. What increases the risk? The following factors may make you more likely to develop this condition:  Having a long-term (chronic) illness that has not been treated the right way, such as: ? Diabetes. ? Heart disease. ? Kidney disease.  Being 65 years of age or older.  Having a disability.  Living in a place that is high above the ground or sea (high in altitude). The thinner, dried air causes more fluid loss.  Doing exercises that put stress on your body for a long time. What are the signs or symptoms? Symptoms of dehydration depend on how bad it is. Mild or worse dehydration  Thirst.  Dry lips or dry mouth.  Feeling dizzy or light-headed, especially when you stand up from sitting.  Muscle cramps.  Your body making: ? Dark pee (urine). Pee may be the color of tea. ? Less pee than normal. ? Less tears than normal.  Headache. Very bad dehydration  Changes in skin. Skin may: ? Be cold to the touch (clammy). ? Be blotchy  or pale. ? Not go back to normal right after you lightly pinch it and let it go.  Little or no tears, pee, or sweat.  Changes in vital signs, such as: ? Fast breathing. ? Low blood pressure. ? Weak pulse. ? Pulse that is more than 100 beats a minute when you are sitting still.  Other changes, such as: ? Feeling very thirsty. ? Eyes that look hollow (sunken). ? Cold hands and feet. ? Being mixed up (confused). ? Being very tired (lethargic) or having trouble waking from sleep. ? Short-term weight loss. ? Loss of consciousness. How is this treated? Treatment for this condition depends on how bad it is. Treatment should start right away. Do not wait until your condition gets very bad. Very bad dehydration is an emergency. You will need to go to a hospital.  Mild or worse dehydration can be treated at home. You may be asked to: ? Drink more fluids. ? Drink an oral rehydration solution (ORS). This drink helps get the right amounts of fluids and salts and minerals in the blood (electrolytes).  Very bad dehydration can be treated: ? With fluids through an IV tube. ? By getting normal levels of salts and minerals in your blood. This is often done by giving salts and minerals through a tube. The tube is passed through your nose and into your stomach. ? By treating the root cause. Follow these instructions at   home: Oral rehydration solution If told by your doctor, drink an ORS:  Make an ORS. Use instructions on the package.  Start by drinking small amounts, about  cup (120 mL) every 5-10 minutes.  Slowly drink more until you have had the amount that your doctor said to have. Eating and drinking  Drink enough clear fluid to keep your pee pale yellow. If you were told to drink an ORS, finish the ORS first. Then, start slowly drinking other clear fluids. Drink fluids such as: ? Water. Do not drink only water. Doing that can make the salt (sodium) level in your body get too low. ? Water  from ice chips you suck on. ? Fruit juice that you have added water to (diluted). ? Low-calorie sports drinks.  Eat foods that have the right amounts of salts and minerals, such as: ? Bananas. ? Oranges. ? Potatoes. ? Tomatoes. ? Spinach.  Do not drink alcohol.  Avoid: ? Drinks that have a lot of sugar. These include:  High-calorie sports drinks.  Fruit juice that you did not add water to.  Soda.  Caffeine. ? Foods that are greasy or have a lot of fat or sugar.         General instructions  Take over-the-counter and prescription medicines only as told by your doctor.  Do not take salt tablets. Doing that can make the salt level in your body get too high.  Return to your normal activities as told by your doctor. Ask your doctor what activities are safe for you.  Keep all follow-up visits as told by your doctor. This is important. Contact a doctor if:  You have pain in your belly (abdomen) and the pain: ? Gets worse. ? Stays in one place.  You have a rash.  You have a stiff neck.  You get angry or annoyed (irritable) more easily than normal.  You are more tired or have a harder time waking than normal.  You feel: ? Weak or dizzy. ? Very thirsty. Get help right away if you have:  Any symptoms of very bad dehydration.  Symptoms of vomiting, such as: ? You cannot eat or drink without vomiting. ? Your vomiting gets worse or does not go away. ? Your vomit has blood or green stuff in it.  Symptoms that get worse with treatment.  A fever.  A very bad headache.  Problems with peeing or pooping (having a bowel movement), such as: ? Watery poop that gets worse or does not go away. ? Blood in your poop (stool). This may cause poop to look black and tarry. ? Not peeing in 6-8 hours. ? Peeing only a small amount of very dark pee in 6-8 hours.  Trouble breathing. These symptoms may be an emergency. Do not wait to see if the symptoms will go away. Get  medical help right away. Call your local emergency services (911 in the [place] Do not drive yourself to the hospital. Summary  Dehydration is a condition in which there is not enough water or other fluids in the body. This happens when a person loses more fluids than he or she takes in.  Treatment for this condition depends on how bad it is. Treatment should be started right away. Do not wait until your condition gets very bad.  Drink enough clear fluid to keep your pee pale yellow. If you were told to drink an oral rehydration solution (ORS), finish the ORS first. Then, start slowly drinking other clear fluids.    Take over-the-counter and prescription medicines only as told by your doctor.  Get help right away if you have any symptoms of very bad dehydration. This information is not intended to replace advice given to you by your health care provider. Make sure you discuss any questions you have with your health care provider. Document Revised: 10/09/2018 Document Reviewed: 10/09/2018 [employer] Patient Education  2021 [employer]  

## 2020-08-02 NOTE — Telephone Encounter (Signed)
Called pt to follow up with Eliquis pt assistance, he states hospice is currently paying for his meds.
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
## 2020-12-10 DEATH — deceased
